# Patient Record
Sex: Male | Born: 1960
Health system: Southern US, Community
[De-identification: ages and names within clinical notes are randomized; demographics above are authoritative.]

## PROBLEM LIST (undated history)

## (undated) DIAGNOSIS — M549 Dorsalgia, unspecified: Secondary | ICD-10-CM

## (undated) DIAGNOSIS — G4730 Sleep apnea, unspecified: Secondary | ICD-10-CM

## (undated) DIAGNOSIS — E785 Hyperlipidemia, unspecified: Secondary | ICD-10-CM

## (undated) DIAGNOSIS — I10 Essential (primary) hypertension: Secondary | ICD-10-CM

## (undated) DIAGNOSIS — G8929 Other chronic pain: Secondary | ICD-10-CM

## (undated) DIAGNOSIS — Z720 Tobacco use: Secondary | ICD-10-CM

## (undated) DIAGNOSIS — M542 Cervicalgia: Secondary | ICD-10-CM

## (undated) DIAGNOSIS — E669 Obesity, unspecified: Secondary | ICD-10-CM

## (undated) HISTORY — DX: Obesity, unspecified: E66.9

## (undated) HISTORY — PX: CERVICAL SPINE SURGERY: SHX589

## (undated) HISTORY — DX: Essential (primary) hypertension: I10

## (undated) HISTORY — DX: Hyperlipidemia, unspecified: E78.5

## (undated) HISTORY — DX: Cervicalgia: M54.2

## (undated) HISTORY — DX: Dorsalgia, unspecified: M54.9

## (undated) HISTORY — DX: Other chronic pain: G89.29

## (undated) HISTORY — DX: Tobacco use: Z72.0

---

## 2003-01-20 ENCOUNTER — Ambulatory Visit (HOSPITAL_COMMUNITY): Admission: RE | Admit: 2003-01-20 | Discharge: 2003-01-20 | Payer: Self-pay | Admitting: Family Medicine

## 2003-01-20 ENCOUNTER — Encounter: Payer: Self-pay | Admitting: Family Medicine

## 2003-03-16 ENCOUNTER — Encounter: Payer: Self-pay | Admitting: *Deleted

## 2003-03-16 ENCOUNTER — Inpatient Hospital Stay (HOSPITAL_COMMUNITY): Admission: EM | Admit: 2003-03-16 | Discharge: 2003-03-18 | Payer: Self-pay | Admitting: *Deleted

## 2003-08-12 ENCOUNTER — Ambulatory Visit (HOSPITAL_COMMUNITY): Admission: RE | Admit: 2003-08-12 | Discharge: 2003-08-12 | Payer: Self-pay | Admitting: Family Medicine

## 2003-09-01 ENCOUNTER — Encounter (HOSPITAL_COMMUNITY): Admission: RE | Admit: 2003-09-01 | Discharge: 2003-09-11 | Payer: Self-pay | Admitting: Neurosurgery

## 2003-09-15 ENCOUNTER — Encounter (HOSPITAL_COMMUNITY): Admission: RE | Admit: 2003-09-15 | Discharge: 2003-10-15 | Payer: Self-pay | Admitting: Neurosurgery

## 2005-09-22 ENCOUNTER — Ambulatory Visit: Payer: Self-pay | Admitting: Family Medicine

## 2005-10-20 ENCOUNTER — Ambulatory Visit: Payer: Self-pay | Admitting: Family Medicine

## 2005-10-23 ENCOUNTER — Ambulatory Visit (HOSPITAL_COMMUNITY): Admission: RE | Admit: 2005-10-23 | Discharge: 2005-10-23 | Payer: Self-pay | Admitting: Family Medicine

## 2005-10-23 ENCOUNTER — Ambulatory Visit: Payer: Self-pay | Admitting: *Deleted

## 2006-01-05 ENCOUNTER — Ambulatory Visit: Payer: Self-pay | Admitting: Family Medicine

## 2006-06-29 ENCOUNTER — Ambulatory Visit: Payer: Self-pay | Admitting: Family Medicine

## 2006-09-21 ENCOUNTER — Ambulatory Visit: Payer: Self-pay | Admitting: Family Medicine

## 2007-09-12 ENCOUNTER — Encounter: Payer: Self-pay | Admitting: Family Medicine

## 2007-09-18 ENCOUNTER — Emergency Department (HOSPITAL_COMMUNITY): Admission: EM | Admit: 2007-09-18 | Discharge: 2007-09-18 | Payer: Self-pay | Admitting: Emergency Medicine

## 2007-10-11 ENCOUNTER — Ambulatory Visit: Payer: Self-pay | Admitting: Family Medicine

## 2007-10-16 ENCOUNTER — Encounter: Payer: Self-pay | Admitting: Family Medicine

## 2007-10-16 LAB — CONVERTED CEMR LAB
ALT: 19 units/L (ref 0–53)
AST: 14 units/L (ref 0–37)
Albumin: 4.3 g/dL (ref 3.5–5.2)
Alkaline Phosphatase: 84 units/L (ref 39–117)
BUN: 13 mg/dL (ref 6–23)
Basophils Absolute: 0 10*3/uL (ref 0.0–0.1)
Basophils Relative: 0 % (ref 0–1)
Bilirubin, Direct: 0.1 mg/dL (ref 0.0–0.3)
CO2: 22 meq/L (ref 19–32)
Calcium: 9.5 mg/dL (ref 8.4–10.5)
Chloride: 104 meq/L (ref 96–112)
Cholesterol: 222 mg/dL — ABNORMAL HIGH (ref 0–200)
Creatinine, Ser: 0.89 mg/dL (ref 0.40–1.50)
Eosinophils Absolute: 0.2 10*3/uL (ref 0.0–0.7)
Eosinophils Relative: 2 % (ref 0–5)
Glucose, Bld: 145 mg/dL — ABNORMAL HIGH (ref 70–99)
HCT: 49 % (ref 39.0–52.0)
HDL: 42 mg/dL (ref >39)
Hemoglobin: 16.2 g/dL (ref 13.0–17.0)
Indirect Bilirubin: 0.7 mg/dL (ref 0.0–0.9)
LDL Cholesterol: 139 mg/dL — ABNORMAL HIGH (ref 0–99)
Lymphocytes Relative: 27 % (ref 12–46)
Lymphs Abs: 3.3 10*3/uL (ref 0.7–4.0)
MCHC: 33.1 g/dL (ref 30.0–36.0)
MCV: 86.9 fL (ref 78.0–100.0)
Monocytes Absolute: 0.7 10*3/uL (ref 0.1–1.0)
Monocytes Relative: 5 % (ref 3–12)
Neutro Abs: 8.3 10*3/uL — ABNORMAL HIGH (ref 1.7–7.7)
Neutrophils Relative %: 66 % (ref 43–77)
PSA: 0.95 ng/mL (ref 0.10–4.00)
Platelets: 285 10*3/uL (ref 150–400)
Potassium: 4.2 meq/L (ref 3.5–5.3)
RBC: 5.64 M/uL (ref 4.22–5.81)
RDW: 14 % (ref 11.5–15.5)
Sodium: 141 meq/L (ref 135–145)
Total Bilirubin: 0.8 mg/dL (ref 0.3–1.2)
Total CHOL/HDL Ratio: 5.3
Total Protein: 7.7 g/dL (ref 6.0–8.3)
Triglycerides: 207 mg/dL — ABNORMAL HIGH (ref <150)
VLDL: 41 mg/dL — ABNORMAL HIGH (ref 0–40)
WBC: 12.6 10*3/uL — ABNORMAL HIGH (ref 4.0–10.5)

## 2007-10-17 ENCOUNTER — Encounter: Payer: Self-pay | Admitting: Family Medicine

## 2007-10-17 LAB — CONVERTED CEMR LAB: Hgb A1c MFr Bld: 6.3 % — ABNORMAL HIGH (ref 4.6–6.1)

## 2007-11-06 ENCOUNTER — Ambulatory Visit: Payer: Self-pay | Admitting: Family Medicine

## 2008-01-07 ENCOUNTER — Encounter: Payer: Self-pay | Admitting: Family Medicine

## 2008-02-11 DIAGNOSIS — F17200 Nicotine dependence, unspecified, uncomplicated: Secondary | ICD-10-CM

## 2008-02-11 DIAGNOSIS — M542 Cervicalgia: Secondary | ICD-10-CM | POA: Insufficient documentation

## 2008-02-11 DIAGNOSIS — M549 Dorsalgia, unspecified: Secondary | ICD-10-CM | POA: Insufficient documentation

## 2008-02-11 DIAGNOSIS — E669 Obesity, unspecified: Secondary | ICD-10-CM | POA: Insufficient documentation

## 2008-02-11 DIAGNOSIS — E66811 Obesity, class 1: Secondary | ICD-10-CM | POA: Insufficient documentation

## 2008-02-11 DIAGNOSIS — E785 Hyperlipidemia, unspecified: Secondary | ICD-10-CM

## 2008-02-11 DIAGNOSIS — Z6841 Body Mass Index (BMI) 40.0 and over, adult: Secondary | ICD-10-CM

## 2008-02-11 DIAGNOSIS — I10 Essential (primary) hypertension: Secondary | ICD-10-CM | POA: Insufficient documentation

## 2008-04-07 ENCOUNTER — Encounter: Payer: Self-pay | Admitting: Family Medicine

## 2008-12-22 ENCOUNTER — Encounter: Payer: Self-pay | Admitting: Family Medicine

## 2009-01-15 ENCOUNTER — Encounter: Payer: Self-pay | Admitting: Family Medicine

## 2009-05-26 ENCOUNTER — Ambulatory Visit: Payer: Self-pay | Admitting: Family Medicine

## 2009-05-26 LAB — CONVERTED CEMR LAB: Hgb A1c MFr Bld: 6.5 %

## 2009-06-03 ENCOUNTER — Telehealth: Payer: Self-pay | Admitting: Family Medicine

## 2009-06-04 ENCOUNTER — Encounter: Payer: Self-pay | Admitting: Family Medicine

## 2009-06-14 ENCOUNTER — Encounter: Payer: Self-pay | Admitting: Family Medicine

## 2009-08-16 ENCOUNTER — Telehealth: Payer: Self-pay | Admitting: Family Medicine

## 2009-08-31 ENCOUNTER — Encounter: Payer: Self-pay | Admitting: Family Medicine

## 2009-09-27 ENCOUNTER — Encounter: Payer: Self-pay | Admitting: Family Medicine

## 2009-11-10 ENCOUNTER — Ambulatory Visit: Payer: Self-pay | Admitting: Family Medicine

## 2009-11-10 DIAGNOSIS — N521 Erectile dysfunction due to diseases classified elsewhere: Secondary | ICD-10-CM

## 2009-11-10 DIAGNOSIS — E1169 Type 2 diabetes mellitus with other specified complication: Secondary | ICD-10-CM | POA: Insufficient documentation

## 2009-11-10 DIAGNOSIS — E785 Hyperlipidemia, unspecified: Secondary | ICD-10-CM | POA: Insufficient documentation

## 2009-11-10 DIAGNOSIS — E1159 Type 2 diabetes mellitus with other circulatory complications: Secondary | ICD-10-CM | POA: Insufficient documentation

## 2009-11-17 ENCOUNTER — Encounter: Payer: Self-pay | Admitting: Family Medicine

## 2009-11-18 LAB — CONVERTED CEMR LAB
BUN: 11 mg/dL (ref 6–23)
CO2: 25 meq/L (ref 19–32)
Calcium: 9.4 mg/dL (ref 8.4–10.5)
Chloride: 101 meq/L (ref 96–112)
Cholesterol: 183 mg/dL (ref 0–200)
Creatinine, Ser: 0.8 mg/dL (ref 0.40–1.50)
Creatinine, Urine: 175.5 mg/dL
Glucose, Bld: 130 mg/dL — ABNORMAL HIGH (ref 70–99)
HDL: 40 mg/dL (ref >39)
Hgb A1c MFr Bld: 8.3 % — ABNORMAL HIGH (ref 4.6–6.1)
LDL Cholesterol: 110 mg/dL — ABNORMAL HIGH (ref 0–99)
Microalb Creat Ratio: 8.2 mg/g (ref 0.0–30.0)
Microalb, Ur: 1.44 mg/dL (ref 0.00–1.89)
PSA: 0.57 ng/mL (ref 0.10–4.00)
Potassium: 4.3 meq/L (ref 3.5–5.3)
Sodium: 140 meq/L (ref 135–145)
TSH: 2.674 microintl units/mL (ref 0.350–4.500)
Testosterone: 215.25 ng/dL — ABNORMAL LOW (ref 350–890)
Total CHOL/HDL Ratio: 4.6
Triglycerides: 164 mg/dL — ABNORMAL HIGH (ref <150)
VLDL: 33 mg/dL (ref 0–40)
Vit D, 25-Hydroxy: 18 ng/mL — ABNORMAL LOW (ref 30–89)

## 2009-11-30 ENCOUNTER — Telehealth: Payer: Self-pay | Admitting: Family Medicine

## 2010-01-05 ENCOUNTER — Encounter: Admission: RE | Admit: 2010-01-05 | Discharge: 2010-01-05 | Payer: Self-pay | Admitting: Orthopedic Surgery

## 2010-02-04 ENCOUNTER — Encounter: Admission: RE | Admit: 2010-02-04 | Discharge: 2010-02-04 | Payer: Self-pay | Admitting: *Deleted

## 2010-02-08 ENCOUNTER — Encounter: Payer: Self-pay | Admitting: Family Medicine

## 2010-02-18 ENCOUNTER — Ambulatory Visit: Payer: Self-pay | Admitting: Family Medicine

## 2010-02-18 LAB — HM DIABETES FOOT EXAM

## 2010-02-25 ENCOUNTER — Telehealth: Payer: Self-pay | Admitting: Family Medicine

## 2010-03-22 ENCOUNTER — Encounter: Payer: Self-pay | Admitting: Family Medicine

## 2010-08-08 ENCOUNTER — Telehealth: Payer: Self-pay | Admitting: Family Medicine

## 2010-08-16 ENCOUNTER — Emergency Department (HOSPITAL_COMMUNITY)
Admission: EM | Admit: 2010-08-16 | Discharge: 2010-08-16 | Payer: Self-pay | Source: Home / Self Care | Admitting: Emergency Medicine

## 2010-08-18 ENCOUNTER — Ambulatory Visit: Payer: Self-pay | Admitting: Family Medicine

## 2010-10-11 NOTE — Letter (Signed)
Summary: Letter  Letter   Imported By: Lind Guest 03/22/2010 15:24:14  _____________________________________________________________________  External Attachment:    Type:   Image     Comment:   External Document

## 2010-10-11 NOTE — Letter (Signed)
Summary: PHONE NOTES  PHONE NOTES   Imported By: Lind Guest 03/01/2010 14:34:30  _____________________________________________________________________  External Attachment:    Type:   Image     Comment:   External Document

## 2010-10-11 NOTE — Letter (Signed)
Summary: DEMO  DEMO   Imported By: Lind Guest 03/01/2010 14:44:03  _____________________________________________________________________  External Attachment:    Type:   Image     Comment:   External Document

## 2010-10-11 NOTE — Progress Notes (Signed)
Summary: ORTHOPAEDIC  ORTHOPAEDIC   Imported By: Lind Guest 02/16/2010 13:43:37  _____________________________________________________________________  External Attachment:    Type:   Image     Comment:   External Document

## 2010-10-11 NOTE — Letter (Signed)
Summary: OFFICE NOTES  OFFICE NOTES   Imported By: Lind Guest 03/01/2010 14:42:28  _____________________________________________________________________  External Attachment:    Type:   Image     Comment:   External Document

## 2010-10-11 NOTE — Letter (Signed)
Summary: X RAYS  X RAYS   Imported By: Lind Guest 03/01/2010 14:43:18  _____________________________________________________________________  External Attachment:    Type:   Image     Comment:   External Document

## 2010-10-11 NOTE — Assessment & Plan Note (Signed)
Summary: HTN Room-1   Vital Signs:  Patient profile:   50 year old male Height:      72 inches Weight:      321.25 pounds BMI:     43.73 O2 Sat:      96 % Pulse rate:   112 / minute Resp:     16 per minute BP sitting:   150 / 102 Cuff size:   xl  Vitals Entered By: Everitt Amber LPN (November 10, 1608 10:51 AM)  Nutrition Counseling: Patient's BMI is greater than 25 and therefore counseled on weight management options. CC: Follow up visit, high BP. Hasn't taken his BP pills in awhile due to side effects Comments TIME FOR HBA1C CHECK   CC:  Follow up visit and high BP. Hasn't taken his BP pills in awhile due to side effects.  History of Present Illness: Pt was last seen Sept 2010.  He is here to follow up on his BP.  States he stopped taking his medications months ago because of side effects.  His BP meds were changed in Sept 2010 because he complained of side effects from those.  Pt reports that he is having difficulty with erections.  He is associating this to his medications, but when I asked if the problem gets better off the meds he says no.  Pt states he thinks the Lotrel was better than the last BP med he was on.   Pt states the he sometimes checks his blood sugar at home.  He is unable to recall what the readings are, & only states "they're not too bad."  He has started walking for exercise is the last 3-4 weeks.  He denies chest pain, or difficulty breathing.   Current Medications (verified): 1)  Maxzide 75-50 Mg Tabs (Triamterene-Hctz) .... Take 1 Tablet By Mouth Once A Day 2)  Cyclobenzaprine Hcl 10 Mg Tabs (Cyclobenzaprine Hcl) .... Take 1 Tab By Mouth At Bedtime 3)  Meloxicam 15 Mg Tabs (Meloxicam) .... Take 1 Tablet By Mouth Once A Day 4)  Metformin Hcl 500 Mg Tabs (Metformin Hcl) .... Take 1 Tablet By Mouth Two Times A Day  Allergies (verified): No Known Drug Allergies  Past History:  Past medical, surgical, family and social histories (including risk factors)  reviewed for relevance to current acute and chronic problems.  Past Medical History: Reviewed history from 02/11/2008 and no changes required.   TOBACCO ABUSE (ICD-305.1) NECK PAIN, CHRONIC (ICD-723.1) BACK PAIN, CHRONIC (ICD-724.5) HYPERLIPIDEMIA (ICD-272.4) OBESITY (ICD-278.00) HYPERTENSION (ICD-401.9)  Past Surgical History: Reviewed history from 02/11/2008 and no changes required. Denies surgical history  Family History: Reviewed history from 02/11/2008 and no changes required. Father: cirrhosis Mother: HTN, MI, DEPRESSION Siblings: six all healthy  Social History: Reviewed history from 02/11/2008 and no changes required. Marital Status: Married Children: four Occupation: Runner, broadcasting/film/video Current Smoker Alcohol use-no Drug use-no  Review of Systems General:  Denies chills and fever. Eyes:  Denies blurring and double vision. ENT:  Denies earache, nasal congestion, and sinus pressure. CV:  Denies chest pain or discomfort, palpitations, and swelling of feet. Resp:  Denies cough and shortness of breath. GU:  Complains of erectile dysfunction. Heme:  Denies enlarge lymph nodes and fevers.  Physical Exam  General:  alert, appropriate dress, cooperative to examination, good hygiene, and overweight-appearing.  Wt increased from prev visit. Head:  Normocephalic and atraumatic without obvious abnormalities. No apparent alopecia or balding. Eyes:  No corneal or conjunctival inflammation noted. EOMI. Perrla. Funduscopic exam benign,  without hemorrhages, exudates or papilledema. Vision grossly normal. Ears:  External ear exam shows no significant lesions or deformities.  Otoscopic examination reveals clear canals, tympanic membranes are intact bilaterally without bulging, retraction, inflammation or discharge. Hearing is grossly normal bilaterally. Nose:  External nasal examination shows no deformity or inflammation. Nasal mucosa are pink and moist without lesions or exudates. Mouth:   Oral mucosa and oropharynx without lesions or exudates.  Teeth in good repair. Neck:  No deformities, masses, or tenderness noted. Lungs:  Normal respiratory effort, chest expands symmetrically. Lungs are clear to auscultation, no crackles or wheezes. Heart:  Normal rate and regular rhythm. S1 and S2 normal without gallop, murmur, click, rub or other extra sounds. Pulses:  R posterior tibial normal, R dorsalis pedis normal, L posterior tibial normal, and L dorsalis pedis normal.   Extremities:  No clubbing, cyanosis, edema, or deformity noted with normal full range of motion of all joints.   Neurologic:  alert & oriented X3, sensation intact to light touch, and gait normal.   Cervical Nodes:  No lymphadenopathy noted Psych:  Cognition and judgment appear intact. Alert and cooperative with normal attention span and concentration. No apparent delusions, illusions, hallucinations  Diabetes Management Exam:    Foot Exam (with socks and/or shoes not present):       Sensory-Monofilament:          Left foot: normal          Right foot: normal       Inspection:          Left foot: abnormal             Comments: onycomycosis, callouses, dry peeling plantar aspect          Right foot: abnormal             Comments: onycomycosis, callouses, dry peeling plantar aspect       Nails:          Left foot: fungal infection          Right foot: fungal infection   Impression & Recommendations:  Problem # 1:  HYPERTENSION (ICD-401.9) Assessment Deteriorated Noncompliant with medications.  Will restart Lotrel 10/40 1 daily.   The following medications were removed from the medication list:    Maxzide 75-50 Mg Tabs (Triamterene-hctz) .Marland Kitchen... Take 1 tablet by mouth once a day His updated medication list for this problem includes:    Lotrel 10-40 Mg Caps (Amlodipine besy-benazepril hcl) .Marland Kitchen... 1 daily for high blood pressure  Orders: T-Basic Metabolic Panel [id number]) T-Vitamin D (25-Hydroxy)  [phone])  Problem # 2:  DM (ICD-250.00) Noncompliant with medications.  His updated medication list for this problem includes:    Metformin Hcl 500 Mg Tabs (Metformin hcl) .Marland Kitchen... Take 1 tablet by mouth two times a day    Lotrel 10-40 Mg Caps (Amlodipine besy-benazepril hcl) .Marland Kitchen... 1 daily for high blood pressure  Orders: T-Basic Metabolic Panel [phone]) T-Testosterone; Total [id number]) T- Hemoglobin A1C ([id number]) TLB-Microalbumin/Creat Ratio, Urine ([id number]) T-Vitamin D (25-Hydroxy) ([id number])  Problem # 3:  ERECTILE DYSFUNCTION, ORGANIC (IHK-742.59) Assessment: New Discussed with pt that ED can be caused by some BP meds, but that his problem is ongoing, so not just because of his meds.  Discussed with him that having uncontrolled DM & HTN can cause ED.  That we can discuss treatment options for ED but need his BP controlled first.  Problem # 4:  OBESITY (ICD-278.00) Assessment: Deteriorated  Orders: T-TSH ([id number])  Ht: 72 (11/10/2009)  Wt: 321.25 (11/10/2009)   BMI: 43.73 (11/10/2009)  Complete Medication List: 1)  Cyclobenzaprine Hcl 10 Mg Tabs (Cyclobenzaprine hcl) .... Take 1 tab by mouth at bedtime 2)  Meloxicam 15 Mg Tabs (Meloxicam) .... Take 1 tablet by mouth once a day 3)  Metformin Hcl 500 Mg Tabs (Metformin hcl) .... Take 1 tablet by mouth two times a day 4)  Lotrel 10-40 Mg Caps (Amlodipine besy-benazepril hcl) .Marland Kitchen.. 1 daily for high blood pressure  Other Orders: T-Lipid Profile ([id number]) T-PSA ([id number])  Patient Instructions: 1)  Please schedule a follow-up appointment in 2 weeks with Dr. Lodema Hong. 2)  Have your blood work drawn fasting. 3)  Restart your blood pressure medication & glucophage. 4)  See your eye doctor yearly to check for diabetic eye damage. 5)  It is very important that you take your medications as prescribed, and get your blood pressure & diabetes under control.  If you do not then you are at risk of having a  heart attack, stroke, kidney failure, blindness or amputations. 6)  It is important that you exercise regularly at least 20 minutes 5 times a week. If you develop chest pain, have severe difficulty breathing, or feel very tired , stop exercising immediately and seek medical attention. 7)  You need to lose weight. Consider a lower calorie diet and regular exercise.  Prescriptions: METFORMIN HCL 500 MG TABS (METFORMIN HCL) Take 1 tablet by mouth two times a day  #60 x 2   Entered and Authorized by:   Esperanza Sheets PA   Signed by:   Esperanza Sheets PA on 11/10/2009   Method used:   Electronically to        [facility]  [address] (retail)       [address]       [place], [place]  [address]       Ph: [phone]       Fax: [phone]   RxID:   [id number] LOTREL 10-40 MG CAPS (AMLODIPINE BESY-BENAZEPRIL HCL) 1 daily for high blood pressure  #30 x 2   Entered and Authorized by:   Esperanza Sheets PA   Signed by:   Esperanza Sheets PA on 11/10/2009   Method used:   Electronically to        [facility]  [address] (retail)       [address]       [place], [place]  [address]       Ph: [phone]       Fax: [phone]   RxID:   [id number]

## 2010-10-11 NOTE — Letter (Signed)
Summary: MISC  MISC   Imported By: Lind Guest 03/01/2010 14:33:53  _____________________________________________________________________  External Attachment:    Type:   Image     Comment:   External Document

## 2010-10-11 NOTE — Letter (Signed)
Summary: LABS  LABS   Imported By: Lind Guest 03/01/2010 14:41:03  _____________________________________________________________________  External Attachment:    Type:   Image     Comment:   External Document

## 2010-10-11 NOTE — Letter (Signed)
Summary: Letter  Letter   Imported By: Rudene Anda 09/27/2009 09:15:56  _____________________________________________________________________  External Attachment:    Type:   Image     Comment:   External Document

## 2010-10-11 NOTE — Progress Notes (Signed)
Summary: rx for cream  Phone Note Call from Patient   Summary of Call: pt needs a rx for cream for icthing on stomach [phone]  Follow-up for Phone Call        med sent in , let hiom know Follow-up by: Syliva Overman MD,  February 25, 2010 12:32 PM  Additional Follow-up for Phone Call Additional follow up Details #1::        Phone Call Completed Additional Follow-up by: Adella Hare LPN,  February 25, 2010 3:23 PM    New/Updated Medications: BETAMETHASONE VALERATE 0.1 % CREA (BETAMETHASONE VALERATE) apply to affected area twice daily as needed Prescriptions: BETAMETHASONE VALERATE 0.1 % CREA (BETAMETHASONE VALERATE) apply to affected area twice daily as needed  #45 gm x 0   Entered and Authorized by:   Syliva Overman MD   Signed by:   Syliva Overman MD on 02/25/2010   Method used:   Electronically to        [facility]  [address] [address] (retail)       [address]       [place], [place]  [address]       Ph: [phone]       Fax: [phone]   RxID:   [id number]

## 2010-10-11 NOTE — Assessment & Plan Note (Signed)
Summary: CLEARANCE FOR SURGERY   Vital Signs:  Patient profile:   50 year old male Height:      72 inches Weight:      319.50 pounds BMI:     43.49 O2 Sat:      96 % Pulse rate:   101 / minute Pulse rhythm:   regular Resp:     16 per minute BP sitting:   144 / 100  (left arm)  Vitals Entered By: Everitt Amber LPN (February 18, 2010 10:29 AM)  Nutrition Counseling: Patient's BMI is greater than 25 and therefore counseled on weight management options. CC: Surgical clearance   CC:  Surgical clearance.  History of Present Illness: Pt recently evaluated by neurosurgery , and has neck surgery planned for next week, he is here for surgical clearance. He denies chest pain or palpitations, pND, orthopnea or leg swelling. There is no significant family h/o cAD. he has not been testing his blood sugars and denies polyuria , POLYDYPSIOA OR BLURRED VISION. hE REPORTS  non compliance with his diabvetic meds. He denies any recent fever or chills, head or chest congestion.  Preventive Screening-Counseling & Management  Alcohol-Tobacco     Smoking Cessation Counseling: yes  Current Medications (verified): 1)  Metformin Hcl 500 Mg Tabs (Metformin Hcl) .... Take 1 Tablet By Mouth Two Times A Day 2)  Lotrel 10-40 Mg Caps (Amlodipine Besy-Benazepril Hcl) .Marland Kitchen.. 1 Daily For High Blood Pressure  Allergies (verified): No Known Drug Allergies  Review of Systems      See HPI Eyes:  Denies blurring and discharge. ENT:  Denies hoarseness, nasal congestion, sinus pressure, and sore throat. CV:  Denies chest pain or discomfort, difficulty breathing while lying down, palpitations, shortness of breath with exertion, and swelling of feet. Resp:  Complains of cough; denies sputum productive; smoker's cough, still smokes, accepts the need to quit esp in light of upcoming surgery. GI:  Denies abdominal pain, constipation, diarrhea, nausea, and vomiting. GU:  Complains of erectile dysfunction; denies dysuria,  urinary frequency, and urinary hesitancy; difficulty attaining and maintaining erections. MS:  Complains of joint pain, muscle weakness, and stiffness. Derm:  Denies itching and rash. Endo:  Denies cold intolerance, excessive hunger, excessive thirst, excessive urination, heat intolerance, polyuria, and weight change; uncontrolled dm based on most recent HBA1. Heme:  Denies abnormal bruising and bleeding. Allergy:  Denies hives or rash and itching eyes.  Physical Exam  General:  Well-developed,OBESE,in no acute distress; alert,appropriate and cooperative throughout examination HEENT: No facial asymmetry,  EOMI, No sinus tenderness, TM's Clear, oropharynx  pink and moist. decresed ROM cervical spine  Chest: Clear to auscultation bilaterally.  CVS: S1, S2, No murmurs, No S3.   Abd: Soft, Nontender.  MS: Adequate ROM spine, hips, shoulders and knees.  Ext: No edema.   CNS: CN 2-12 intact, power tone and sensation normal throughout.   Skin: Intact, no visible lesions or rashes.  Psych: Good eye contact, normal affect.  Memory intact, not anxious or depressed appearing.   Diabetes Management Exam:    Foot Exam (with socks and/or shoes not present):       Sensory-Monofilament:          Left foot: diminished       Inspection:          Left foot: normal          Right foot: normal   Impression & Recommendations:  Problem # 1:  ERECTILE DYSFUNCTION, ORGANIC (ICD-607.84) Assessment Deteriorated  His  updated medication list for this problem includes:    Cialis 20 Mg Tabs (Tadalafil) .Marland Kitchen... Take 1 tablet by mouth 30 mionutes before intercourse as needed  Problem # 2:  DM (ICD-250.00) Assessment: Deteriorated  The following medications were removed from the medication list:    Metformin Hcl 500 Mg Tabs (Metformin hcl) .Marland Kitchen... Take 1 tablet by mouth two times a day His updated medication list for this problem includes:    Lotrel 10-40 Mg Caps (Amlodipine besy-benazepril hcl) .Marland Kitchen... 1 daily  for high blood pressure    Metformin Hcl 1000 Mg Tabs (Metformin hcl) .Marland Kitchen... Take 1 tablet by mouth two times a day  Orders: T- Hemoglobin A1C ([id number]) EKG w/ Interpretation ([address])  Labs Reviewed: Creat: 0.80 (11/17/2009)    Reviewed HgBA1c results: 8.3 (11/17/2009)  6.5 (05/26/2009)  Problem # 3:  HYPERTENSION (ICD-401.9) Assessment: Unchanged  His updated medication list for this problem includes:    Lotrel 10-40 Mg Caps (Amlodipine besy-benazepril hcl) .Marland Kitchen... 1 daily for high blood pressure    Clonidine Hcl 0.2 Mg Tabs (Clonidine hcl) .Marland Kitchen... Take 1 tab by mouth at bedtime  BP today: 144/100 Prior BP: 150/102 (11/10/2009)  Labs Reviewed: K+: 4.3 (11/17/2009) Creat: : 0.80 (11/17/2009)   Chol: 183 (11/17/2009)   HDL: 40 (11/17/2009)   LDL: 110 (11/17/2009)   TG: 164 (11/17/2009)  Orders: EKG w/ Interpretation ([address]), nsr, no evidence of ischemia  Problem # 4:  HYPERLIPIDEMIA (ICD-272.4) Assessment: Comment Only  His updated medication list for this problem includes:    Lovastatin 40 Mg Tabs (Lovastatin) .Marland Kitchen... Take 1 tab by mouth at bedtime  Orders: T-Hepatic Function ([id number])  Labs Reviewed: SGOT: 14 (10/16/2007)   SGPT: 19 (10/16/2007)   HDL:40 (11/17/2009), 42 (10/16/2007)  LDL:110 (11/17/2009), 139 (10/16/2007)  Chol:183 (11/17/2009), 222 (10/16/2007)  Trig:164 (11/17/2009), 207 (10/16/2007)  Complete Medication List: 1)  Lotrel 10-40 Mg Caps (Amlodipine besy-benazepril hcl) .Marland Kitchen.. 1 daily for high blood pressure 2)  Clonidine Hcl 0.2 Mg Tabs (Clonidine hcl) .... Take 1 tab by mouth at bedtime 3)  Lovastatin 40 Mg Tabs (Lovastatin) .... Take 1 tab by mouth at bedtime 4)  Metformin Hcl 1000 Mg Tabs (Metformin hcl) .... Take 1 tablet by mouth two times a day 5)  Chantix Starting Month Pak 0.5 Mg X 11 & 1 Mg X 42 Tabs (Varenicline tartrate) .... Use as directed 6)  Cialis 20 Mg Tabs (Tadalafil) .... Take 1 tablet by mouth 30 mionutes before intercourse as  needed  Patient Instructions: 1)  F/U in 6 weeks. 2)  It is important that you exercise regularly at least 20 minutes 5 times a week. If you develop chest pain, have severe difficulty breathing, or feel very tired , stop exercising immediately and seek medical attention. 3)  You need to lose weight. Consider a lower calorie diet and regular exercise. 4)  Dose increase of the metformin , take TWO 500mg  twice daily, until finished, then the new med is 1000mg  twice daily  5)  Tobacco is very bad for your health and your loved ones! You Should stop smoking!. 6)  Stop Smoking Tips: Choose a Quit date. Cut down before the Quit date. decide what you will do as a substitute when you feel the urge to smoke(gum,toothpick,exercise).Med sent in for smoking cessation Prescriptions: CIALIS 20 MG TABS (TADALAFIL) Take 1 tablet by mouth 30 mionutes before intercourse as needed  #3 x 3   Entered and Authorized by:   Syliva Overman MD   Signed by:  Syliva Overman MD on 02/18/2010   Method used:   Electronically to        [facility] [address] [facility] (retail)       [address]       [place], [place]  [address]       Ph: [phone]       Fax: [phone]   RxID:   [id number] CHANTIX STARTING MONTH PAK 0.5 MG X 11 & 1 MG X 42 TABS (VARENICLINE TARTRATE) Use as directed  #1 x 0   Entered and Authorized by:   Syliva Overman MD   Signed by:   Syliva Overman MD on 02/18/2010   Method used:   Printed then faxed to ...       [facility]  [place] [address] (retail)       [address]       [place], [place]  [address]       Ph: [phone]       Fax: [phone]   RxID:   [id number] METFORMIN HCL 1000 MG TABS (METFORMIN HCL) Take 1 tablet by mouth two times a day  #60 x 3   Entered and Authorized by:   Syliva Overman MD   Signed by:   Syliva Overman MD on 02/18/2010   Method used:   Printed then faxed to ...       [facility]  [address] [address] (retail)       [address]        [place], [place]  [address]       Ph: [phone]       Fax: [phone]   RxID:   [id number] LOVASTATIN 40 MG TABS (LOVASTATIN) Take 1 tab by mouth at bedtime  #30 x 3   Entered and Authorized by:   Syliva Overman MD   Signed by:   Syliva Overman MD on 02/18/2010   Method used:   Electronically to        [facility]  [place] [address] (retail)       [address]       [place], [place]  [address]       Ph: [phone]       Fax: [phone]   RxID:   [id number] CLONIDINE HCL 0.2 MG TABS (CLONIDINE HCL) Take 1 tab by mouth at bedtime  #30 x 2   Entered and Authorized by:   Syliva Overman MD   Signed by:   Syliva Overman MD on 02/18/2010   Method used:   Electronically to        [facility]  [place] [address] (retail)       [address]       [place], [place]  [address]       Ph: [phone]       Fax: [phone]   RxID:   [id number] METFORMIN HCL 500 MG TABS (METFORMIN HCL) Take 1 tablet by mouth two times a day  #60 x 5   Entered by:   Everitt Amber LPN   Authorized by:   Syliva Overman MD   Signed by:   Everitt Amber LPN on 19/50/9326   Method used:   Electronically to        [facility] [address] (retail)  [address]       [place], [place]  [address]       Ph: [phone]       Fax: [phone]   RxID:   [id number]

## 2010-10-11 NOTE — Progress Notes (Signed)
Summary: LAB RESULTS  Phone Note Call from Patient   Summary of Call: WANTS TO KNOW ABOUT HIS LAB RESULTS  CALL BACK AT [phone] Initial call taken by: Lind Guest,  November 30, 2009 9:05 AM  Follow-up for Phone Call        patient aware Follow-up by: Adella Hare LPN,  November 30, 2009 11:53 AM

## 2010-10-11 NOTE — Letter (Signed)
Summary: CONSULTS  CONSULTS   Imported By: Lind Guest 03/01/2010 14:40:13  _____________________________________________________________________  External Attachment:    Type:   Image     Comment:   External Document

## 2010-10-11 NOTE — Progress Notes (Signed)
Summary: medication  Phone Note Call from Patient   Summary of Call: patient has an appt. on Dec. 8 at 8:45 he requested that you please call in his medication that was denied until he comes in. please advise patient. Initial call taken by: Curtis Sites,  August 08, 2010 11:07 AM  Follow-up for Phone Call        patient aware Follow-up by: Adella Hare LPN,  August 08, 2010 4:35 PM    Prescriptions: METFORMIN HCL 1000 MG TABS (METFORMIN HCL) Take 1 tablet by mouth two times a day  #60 x 0   Entered by:   Adella Hare LPN   Authorized by:   Syliva Overman MD   Signed by:   Adella Hare LPN on 65/78/4696   Method used:   Electronically to        [facility]  [address] (retail)       [address]       [place], [place]  [address]       Ph: [phone]       Fax: [phone]   RxID:   [id number] LOVASTATIN 40 MG TABS (LOVASTATIN) Take 1 tab by mouth at bedtime  #30 x 0   Entered by:   Adella Hare LPN   Authorized by:   Syliva Overman MD   Signed by:   Adella Hare LPN on 29/51/8841   Method used:   Electronically to        [facility]  [address] (retail)       [address]       [place], [place]  [address]       Ph: [phone]       Fax: [phone]   RxID:   [id number] CLONIDINE HCL 0.2 MG TABS (CLONIDINE HCL) Take 1 tab by mouth at bedtime  #30 Each x 0   Entered by:   Adella Hare LPN   Authorized by:   Syliva Overman MD   Signed by:   Adella Hare LPN on 16/03/3709   Method used:   Electronically to        [facility]  [address] (retail)       [address]       [place], [place]  [address]       Ph: [phone]       Fax: [phone]   RxID:   [id number] LOTREL 10-40 MG CAPS (AMLODIPINE BESY-BENAZEPRIL HCL) 1 daily for high blood pressure  #30 Each x 0   Entered by:   Adella Hare LPN   Authorized by:   Syliva Overman MD   Signed by:   Adella Hare LPN on 02/58/5277   Method used:    Electronically to        [facility]  [address] (retail)       [address]       [place], [place]  [address]       Ph: [phone]       Fax: [phone]   RxID:   [id number]

## 2010-10-14 NOTE — Letter (Signed)
Summary: HISTORY AND PHYSICAL  HISTORY AND PHYSICAL   Imported By: Lind Guest 03/01/2010 14:42:02  _____________________________________________________________________  External Attachment:    Type:   Image     Comment:   External Document

## 2010-11-18 ENCOUNTER — Emergency Department (HOSPITAL_COMMUNITY)
Admission: EM | Admit: 2010-11-18 | Discharge: 2010-11-18 | Disposition: A | Discharge status: Home / Self Care | Payer: PRIVATE HEALTH INSURANCE | Attending: Emergency Medicine | Admitting: Emergency Medicine

## 2010-11-18 DIAGNOSIS — R42 Dizziness and giddiness: Secondary | ICD-10-CM | POA: Insufficient documentation

## 2010-11-18 DIAGNOSIS — I10 Essential (primary) hypertension: Secondary | ICD-10-CM | POA: Insufficient documentation

## 2010-11-18 DIAGNOSIS — Z79899 Other long term (current) drug therapy: Secondary | ICD-10-CM | POA: Insufficient documentation

## 2010-11-18 DIAGNOSIS — E119 Type 2 diabetes mellitus without complications: Secondary | ICD-10-CM | POA: Insufficient documentation

## 2010-11-28 ENCOUNTER — Ambulatory Visit (INDEPENDENT_AMBULATORY_CARE_PROVIDER_SITE_OTHER): Payer: PRIVATE HEALTH INSURANCE | Admitting: Family Medicine

## 2010-11-28 ENCOUNTER — Encounter: Payer: Self-pay | Admitting: Family Medicine

## 2010-11-28 DIAGNOSIS — I10 Essential (primary) hypertension: Secondary | ICD-10-CM

## 2010-11-28 DIAGNOSIS — E119 Type 2 diabetes mellitus without complications: Secondary | ICD-10-CM

## 2010-11-28 DIAGNOSIS — E785 Hyperlipidemia, unspecified: Secondary | ICD-10-CM

## 2010-11-28 DIAGNOSIS — E669 Obesity, unspecified: Secondary | ICD-10-CM

## 2010-11-29 ENCOUNTER — Encounter: Payer: Self-pay | Admitting: Family Medicine

## 2010-11-29 ENCOUNTER — Telehealth: Payer: Self-pay | Admitting: Family Medicine

## 2010-11-29 LAB — CONVERTED CEMR LAB
ALT: 12 units/L (ref 0–53)
AST: 13 units/L (ref 0–37)
Albumin: 4.2 g/dL (ref 3.5–5.2)
Alkaline Phosphatase: 75 units/L (ref 39–117)
BUN: 10 mg/dL (ref 6–23)
Basophils Absolute: 0 10*3/uL (ref 0.0–0.1)
Basophils Relative: 0 % (ref 0–1)
Bilirubin, Direct: 0.2 mg/dL (ref 0.0–0.3)
CO2: 25 meq/L (ref 19–32)
Calcium: 9.4 mg/dL (ref 8.4–10.5)
Chloride: 103 meq/L (ref 96–112)
Cholesterol: 224 mg/dL — ABNORMAL HIGH (ref 0–200)
Creatinine, Ser: 0.77 mg/dL (ref 0.40–1.50)
Creatinine, Urine: 144 mg/dL
Eosinophils Absolute: 0.2 10*3/uL (ref 0.0–0.7)
Eosinophils Relative: 2 % (ref 0–5)
Glucose, Bld: 118 mg/dL — ABNORMAL HIGH (ref 70–99)
HCT: 46.2 % (ref 39.0–52.0)
HDL: 39 mg/dL — ABNORMAL LOW (ref >39)
Hemoglobin: 15.1 g/dL (ref 13.0–17.0)
Hgb A1c MFr Bld: 7.1 % — ABNORMAL HIGH (ref <5.7)
Indirect Bilirubin: 0.9 mg/dL (ref 0.0–0.9)
LDL Cholesterol: 144 mg/dL — ABNORMAL HIGH (ref 0–99)
Lymphocytes Relative: 32 % (ref 12–46)
Lymphs Abs: 3.5 10*3/uL (ref 0.7–4.0)
MCHC: 32.7 g/dL (ref 30.0–36.0)
MCV: 86.7 fL (ref 78.0–100.0)
Microalb Creat Ratio: 12.1 mg/g (ref 0.0–30.0)
Microalb, Ur: 1.74 mg/dL (ref 0.00–1.89)
Monocytes Absolute: 0.5 10*3/uL (ref 0.1–1.0)
Monocytes Relative: 5 % (ref 3–12)
Neutro Abs: 6.9 10*3/uL (ref 1.7–7.7)
Neutrophils Relative %: 62 % (ref 43–77)
PSA: 0.93 ng/mL (ref <=4.00)
Platelets: 300 10*3/uL (ref 150–400)
Potassium: 4.2 meq/L (ref 3.5–5.3)
RBC: 5.33 M/uL (ref 4.22–5.81)
RDW: 14.9 % (ref 11.5–15.5)
Sodium: 140 meq/L (ref 135–145)
TSH: 2.928 microintl units/mL (ref 0.350–4.500)
Total Bilirubin: 1.1 mg/dL (ref 0.3–1.2)
Total CHOL/HDL Ratio: 5.7
Total Protein: 7.4 g/dL (ref 6.0–8.3)
Triglycerides: 203 mg/dL — ABNORMAL HIGH (ref <150)
VLDL: 41 mg/dL — ABNORMAL HIGH (ref 0–40)
Vit D, 25-Hydroxy: 20 ng/mL — ABNORMAL LOW (ref 30–89)
WBC: 11.1 10*3/uL — ABNORMAL HIGH (ref 4.0–10.5)

## 2010-12-01 ENCOUNTER — Telehealth: Payer: Self-pay | Admitting: Family Medicine

## 2010-12-01 ENCOUNTER — Other Ambulatory Visit: Payer: Self-pay

## 2010-12-01 DIAGNOSIS — I10 Essential (primary) hypertension: Secondary | ICD-10-CM

## 2010-12-01 MED ORDER — AMLODIPINE BESY-BENAZEPRIL HCL 10-40 MG PO CAPS: 1.0000 | ORAL_CAPSULE | Freq: Every day | ORAL | Status: DC

## 2010-12-01 NOTE — Telephone Encounter (Signed)
Patient aware.

## 2010-12-01 NOTE — Telephone Encounter (Signed)
Med sent.

## 2010-12-07 ENCOUNTER — Encounter: Payer: Self-pay | Admitting: Family Medicine

## 2010-12-08 NOTE — Assessment & Plan Note (Signed)
Summary: office visit   Vital Signs:  Patient profile:   50 year old male Height:      72 inches Weight:      313.25 pounds BMI:     42.64 O2 Sat:      98 % Pulse rate:   98 / minute Pulse rhythm:   regular Resp:     16 per minute BP sitting:   158 / 110  (left arm) Cuff size:   xl  Vitals Entered By: Everitt Amber LPN (November 28, 2010 8:30 AM)  Nutrition Counseling: Patient's BMI is greater than 25 and therefore counseled on weight management options. CC: had stopped all of his meds but started feeling dizzy the other week so he went to the ER and his pressure was elevated so he started back on his BP med   CC:  had stopped all of his meds but started feeling dizzy the other week so he went to the ER and his pressure was elevated so he started back on his BP med.  History of Present Illness: Pt reports he had an episode of  feeling as though he was drunk 2 fridays ago, went to the ed, his bP was reportedly 210, he had stopped his meds, buit has now resumed one BP med.He denies dry mouth, excessive thirst , or blurred vision, he has laso stopped his diabetic meds. Vascular disease is in hisst degree relatives  Preventive Screening-Counseling & Management  Alcohol-Tobacco     Smoking Cessation Counseling: yes  Current Medications (verified): 1)  Lotrel 10-40 Mg Caps (Amlodipine Besy-Benazepril Hcl) .Marland Kitchen.. 1 Daily For High Blood Pressure  Allergies (verified): No Known Drug Allergies  Review of Systems      See HPI General:  Complains of fatigue. Eyes:  Complains of blurring. ENT:  Denies earache, hoarseness, nasal congestion, and sinus pressure. CV:  Denies chest pain or discomfort, palpitations, and swelling of feet. Resp:  Denies cough and sputum productive. GI:  Denies abdominal pain, constipation, diarrhea, nausea, and vomiting. GU:  Complains of erectile dysfunction. MS:  Denies joint pain and stiffness. Psych:  Denies anxiety and depression. Endo:  Complains of  excessive thirst and excessive urination. Heme:  Denies abnormal bruising and bleeding. Allergy:  Denies hives or rash and itching eyes.  Physical Exam  General:  Well-developed,obese,in no acute distress; alert,appropriate and cooperative throughout examination HEENT: No facial asymmetry, reduced ROM neck EOMI, No sinus tenderness, TM's Clear, oropharynx  pink and moist.   Chest: Clear to auscultation bilaterally.  CVS: S1, S2, No murmurs, No S3.   Abd: Soft, Nontender.  MS: Adequate ROM spine, hips, shoulders and knees.  Ext: No edema.   CNS: CN 2-12 intact, power tone and sensation normal throughout.   Skin: Intact, no visible lesions or rashes.  Psych: Good eye contact, normal affect.  Memory intact, not anxious or depressed appearing.    Impression & Recommendations:  Problem # 1:  DM (ICD-250.00) Assessment Comment Only  The following medications were removed from the medication list:    Metformin Hcl 1000 Mg Tabs (Metformin hcl) .Marland Kitchen... Take 1 tablet by mouth two times a day His updated medication list for this problem includes:    Lotrel 10-40 Mg Caps (Amlodipine besy-benazepril hcl) .Marland Kitchen... 1 daily for high blood pressure  Orders: T- Hemoglobin A1C ([address]-60454) T-Urine Microalbumin w/creat. ratio [phone])  Labs Reviewed: Creat: 0.80 (11/17/2009)    Reviewed HgBA1c results: 8.3 (11/17/2009)labs past due  6.5 (05/26/2009)  Problem # 2:  NECK PAIN, CHRONIC (ICD-723.1) Assessment: Unchanged awaiting surgery  Problem # 3:  HYPERTENSION (ICD-401.9) Assessment: Deteriorated  The following medications were removed from the medication list:    Clonidine Hcl 0.2 Mg Tabs (Clonidine hcl) .Marland Kitchen... Take 1 tab by mouth at bedtime His updated medication list for this problem includes:    Lotrel 10-40 Mg Caps (Amlodipine besy-benazepril hcl) .Marland Kitchen... 1 daily for high blood pressure    Triamterene-hctz 75-50 Mg Tabs (Triamterene-hctz) .Marland Kitchen... Take 1 tablet by mouth once a  day  Orders: T-Basic Metabolic Panel ([id number])  BP today: 158/110 Prior BP: 144/100 (02/18/2010)  Labs Reviewed: K+: 4.3 (11/17/2009) Creat: : 0.80 (11/17/2009)   Chol: 183 (11/17/2009)   HDL: 40 (11/17/2009)   LDL: 110 (11/17/2009)   TG: 164 (11/17/2009)  Problem # 4:  HYPERLIPIDEMIA (ICD-272.4) Assessment: Comment Only  The following medications were removed from the medication list:    Lovastatin 40 Mg Tabs (Lovastatin) .Marland Kitchen... Take 1 tab by mouth at bedtime Low fat dietdiscussed and encouraged  Orders: T-Hepatic Function [phone]) T-Lipid Profile [phone])  Labs Reviewed: SGOT: 14 (10/16/2007)   SGPT: 19 (10/16/2007)   HDL:40 (11/17/2009), 42 (10/16/2007)  LDL:110 (11/17/2009), 139 (10/16/2007)  Chol:183 (11/17/2009), 222 (10/16/2007)  Trig:164 (11/17/2009), 207 (10/16/2007)  Complete Medication List: 1)  Lotrel 10-40 Mg Caps (Amlodipine besy-benazepril hcl) .Marland Kitchen.. 1 daily for high blood pressure 2)  Triamterene-hctz 75-50 Mg Tabs (Triamterene-hctz) .... Take 1 tablet by mouth once a day 3)  Onetouch Test Strp (Glucose blood) .... Once daily testing dx:250.00 4)  Onetouch Delica Lancets Misc (Lancets) .... Once daily testing dx:250.00  Other Orders: T-CBC w/Diff [phone]) T-TSH [id number]) T-PSA [phone]) T-Vitamin D (25-Hydroxy) [phone])  Patient Instructions: 1)  nurse visit in 2 weekds for bP check. 2)  mD f/u in 6 weeks. 3)  Your bP is high, new additional med sent in for bP take both together at the same time every morning. 4)  Tobacco is very bad for your health and your loved ones! You Should stop smoking!. 5)  Stop Smoking Tips: Choose a Quit date. Cut down before the Quit date. decide what you will do as a substitute when you feel the urge to smoke(gum,toothpick,exercise). 6)  It is important that you exercise regularly at least 20 minutes 5 times a week. If you develop chest pain, have severe difficulty breathing, or feel very tired ,  stop exercising immediately and seek medical attention.current is 10/day 7)  You need to lose weight. Consider a lower calorie diet and regular exercise.  8)  BMP prior to visit, ICD-9: 9)  Hepatic Panel prior to visit, ICD-9: 10)  Lipid Panel prior to visit, ICD-9: 11)  TSH prior to visit, ICD-9:   today 12)  CBC w/ Diff prior to visit, ICD-9: 13)  PSA prior to visit, ICD-9: 14)  HbgA1C prior to visit, ICD-9: 15)  Vitamin d 16)  Urine Microalbumin prior to visit, ICD-9: Prescriptions: ONETOUCH DELICA LANCETS  MISC (LANCETS) once daily testing dx:250.00  #100 x 2   Entered by:   Adella Hare LPN   Authorized by:   Syliva Overman MD   Signed by:   Adella Hare LPN on 54/27/0623   Method used:   Electronically to        [facility]  [address] (retail)       [address]       [place], [place]  [address]  Ph: [phone]       Fax: [phone]   RxID:   [id number] ONETOUCH TEST  STRP (GLUCOSE BLOOD) once daily testing dx:250.00  #100 x 2   Entered by:   Adella Hare LPN   Authorized by:   Syliva Overman MD   Signed by:   Adella Hare LPN on 09/13/7251   Method used:   Electronically to        [facility]  [address] (retail)       [address]       [place], [place]  [address]       Ph: [phone]       Fax: [phone]   RxID:   [id number] TRIAMTERENE-HCTZ 75-50 MG TABS (TRIAMTERENE-HCTZ) Take 1 tablet by mouth once a day  #30 x 1   Entered and Authorized by:   Syliva Overman MD   Signed by:   Syliva Overman MD on 11/28/2010   Method used:   Electronically to        [facility]  [address] (retail)       [address]       [place], [place]  [address]       Ph: [phone]       Fax: [phone]   RxID:   [id number]    Orders Added: 1)  Est. Patient Level IV [94[address]] 2)  T-Basic Metabolic Panel [62703-50093] 3)  T-Hepatic Function [80076-22960] 4)  T-Lipid Profile [80061-22930] 5)  T-CBC  w/Diff [81829-93716] 6)  T-TSH [96789-38101] 7)  T- Hemoglobin A1C [83036-23375] 8)  T-PSA [75102-58527] 9)  T-Urine Microalbumin w/creat. ratio [82043-[address]] 10)  T-Vitamin D (25-Hydroxy) [phone]

## 2010-12-08 NOTE — Miscellaneous (Signed)
  Clinical Lists Changes  Medications: Added new medication of METFORMIN HCL 500 MG TABS (METFORMIN HCL) Take 1 tablet by mouth two times a day - Signed Added new medication of PRAVASTATIN SODIUM 40 MG TABS (PRAVASTATIN SODIUM) Take 1 tab by mouth at bedtime - Signed Rx of METFORMIN HCL 500 MG TABS (METFORMIN HCL) Take 1 tablet by mouth two times a day;  #60 x 3;  Signed;  Entered by: Syliva Overman MD;  Authorized by: Syliva Overman MD;  Method used: Historical Rx of PRAVASTATIN SODIUM 40 MG TABS (PRAVASTATIN SODIUM) Take 1 tab by mouth at bedtime;  #30 x 3;  Signed;  Entered by: Syliva Overman MD;  Authorized by: Syliva Overman MD;  Method used: Historical    Prescriptions: PRAVASTATIN SODIUM 40 MG TABS (PRAVASTATIN SODIUM) Take 1 tab by mouth at bedtime  #30 x 3   Entered and Authorized by:   Syliva Overman MD   Signed by:   Syliva Overman MD on 11/29/2010   Method used:   Historical   RxID:   [id number] METFORMIN HCL 500 MG TABS (METFORMIN HCL) Take 1 tablet by mouth two times a day  #60 x 3   Entered and Authorized by:   Syliva Overman MD   Signed by:   Syliva Overman MD on 11/29/2010   Method used:   Historical   RxID:   [id number]

## 2010-12-12 ENCOUNTER — Ambulatory Visit (INDEPENDENT_AMBULATORY_CARE_PROVIDER_SITE_OTHER): Payer: PRIVATE HEALTH INSURANCE | Admitting: Family Medicine

## 2010-12-12 VITALS — BP 116/84 | Wt 304.0 lb

## 2010-12-12 DIAGNOSIS — I10 Essential (primary) hypertension: Secondary | ICD-10-CM

## 2010-12-12 NOTE — Progress Notes (Signed)
Patient advised no med changes at this time  Viagra samples given [id number] 7/15

## 2011-01-03 ENCOUNTER — Encounter: Payer: Self-pay | Admitting: Family Medicine

## 2011-01-06 ENCOUNTER — Encounter: Payer: Self-pay | Admitting: Family Medicine

## 2011-01-09 ENCOUNTER — Ambulatory Visit: Payer: PRIVATE HEALTH INSURANCE | Admitting: Family Medicine

## 2011-01-27 NOTE — Op Note (Signed)
NAME:  Dakota Mendez, Dakota Mendez                         ACCOUNT NO.:  [id number]   MEDICAL RECORD NO.:  [id number]                   PATIENT TYPE:  INP   LOCATION:  A223                                 FACILITY:  [facility]   PHYSICIAN:  Lionel December, M.D.                 DATE OF BIRTH:  Aug 06, 1961   DATE OF PROCEDURE:  03/17/2003  DATE OF DISCHARGE:                                 OPERATIVE REPORT   PROCEDURE:  Esophagogastroduodenoscopy.   INDICATIONS:  [name] is a 50 year old African-American male who presents with  melena, hematemesis, and epigastric pain.  His hemoglobin is around 12 g.  He is undergoing a diagnostic procedure.  The procedure was reviewed with  the patient and informed consent was obtained.   PREMEDICATION:  Cetacaine spray for pharyngeal topical anesthesia, Demerol  50 mg IV, Versed 8 mg IV in divided dose.   INSTRUMENT USED:  Olympus video system.   FINDINGS:  Procedure performed in endoscopy suite.  The patient's vital  signs and O2 saturation were monitored during the procedure and remained  stable.  The patient was placed in the left lateral recumbent position and  the endoscope was passed via oropharynx without any difficulty into  esophagus.   Esophagus:  Mucosa of the proximal and middle third was normal.  The distal  3 cm had multiple erosions along with two right at the GE junction.  No  hernia was noted.  The squamocolumnar junction was serrated.   Stomach:  It was empty and distended very well with insufflation.  Folds of  proximal stomach were normal.  Examination of the mucosa revealed a few  erosions at antrum and a small prepyloric ulcer toward the anterior wall.  It had a clean base.  The mucosa at pyloric channel was erythematous and  edematous, but it was patent.  Angularis, fundus, and cardia were normal.   Duodenum:  Examination of the bulb revealed normal mucosa.  The scope was  passed to the second part of the duodenum, and the mucosa and folds  are  normal.  The scope was withdrawn.  The patient tolerated the procedure well.   FINAL DIAGNOSES:  1. Erosive reflux esophagitis.  2. Erosive gastritis with a small prepyloric ulcer and pyloric channel     inflammation.  3. Suspect blood loss secondary to peptic ulcer disease.    RECOMMENDATIONS:  1. Will increase his Protonix to 40 mg b.i.d., which will be given p.o.  2. H. pylori serology will be checked.  3. Will advance his diet.                                               Lionel December, M.D.    NR/MEDQ  D:  03/17/2003  T:  03/17/2003  Job:  [id number]   cc:   Sarita Bottom, M.D.

## 2011-01-27 NOTE — H&P (Signed)
NAME:  Dakota Mendez, Dakota Mendez                         ACCOUNT NO.:  [id number]   MEDICAL RECORD NO.:  [id number]                   PATIENT TYPE:  INP   LOCATION:  A223                                 FACILITY:  [facility]   PHYSICIAN:  Gracelyn Nurse, M.D.              DATE OF BIRTH:  [birth year]   DATE OF ADMISSION:  03/16/2003  DATE OF DISCHARGE:                                HISTORY & PHYSICAL   CHIEF COMPLAINT:  Abdominal pain.   HISTORY OF PRESENT ILLNESS:  This is a 50 year old black male with a history  of gastroesophageal reflux disease.  He has noted for the past two days some  worsening epigastric pain and some dark tarry stools.  Today he has felt  like his food was stuck in his stomach so he induced vomiting.  His emesis  was coffee-grounds-looking.  He also felt kind of lightheaded.  He denies  any aspirin use or NSAID use.  He also denies alcohol use.   PAST MEDICAL HISTORY:  1. Hypertension.  2. GERD.  3. No surgeries.   ALLERGIES:  No known drug allergies.   CURRENT MEDICATIONS:  1. Diovan 160/12.5 mg, 2 daily.  2. Rolaids p.r.n.   SOCIAL HISTORY:  He smokes one pack of cigarettes a day.  Does not drink  alcohol.  He is married, with two children.  He is a Teaching laboratory technician.   FAMILY HISTORY:  Mother, age 53, has coronary artery disease.  Father died,  age 69, from alcohol abuse.   REVIEW OF SYSTEMS:  As per HPI he does have chronic reflux symptoms.  Remainder of systems negative.   PHYSICAL EXAMINATION:  VITAL SIGNS:  Temperature is 98.3, pulse 119,  respirations 20, blood pressure 85/66.  GENERAL:  This is an obese black male in no acute distress.  HEENT:  Pupils are equal, round, and reactive to light.  Extraocular  movements intact.  Oral mucosa is dry.  Oropharynx is clear.  CARDIOVASCULAR:  Tachycardic.  No murmurs.  LUNGS:  Clear to auscultation.  ABDOMEN:  Obese, nontender, nondistended.  Bowel sounds are positive.  EXTREMITIES:  There is 1+  lower extremity edema.  NEUROLOGIC:  Cranial nerves II-XII grossly intact.  No focal deficits.  SKIN:  Moist.  With no rash.  RECTAL:  Guaiac-positive.   ADMISSION LABORATORIES:  White blood count 17.5, hemoglobin is 12.7,  platelets 325.  Sodium 135, potassium 3.8, chloride 104, CO2 28, BUN 25,  creatinine 1.3, glucose 110.   ASSESSMENT AND PLAN:  1. Gastrointestinal bleeding:  Suspect an upper GI bleed.  Will admit him,     start a PPI, make him n.p.o.  I will go ahead and cross and type him.     Consult GI for upper endoscopy.  His hemoglobin is stable for now.     However, with his symptoms I suspect it will go lower so we will follow  up another hemoglobin check in a few hours.  2. Prerenal azotemia:  This is likely secondary to hypovolemia from the GI     bleeding.  Will go ahead and give him IV fluids and blood if necessary.     Recheck his renal function in the morning.  3. Hypotension:  This is secondary to hypovolemia.  He has responded to IV     fluids in the ER.  Will continue this.  4. Hypertension:  Will hold his medications for now since he is a little     hypotensive.  5. Gastroesophageal reflux disease:  He will be on a PPI.  These could have     been symptoms of an ulcer also.  6. Leukocytosis:  This may all be stress demargination from his GI bleeding;     however, will check a UA and a chest x-ray.                                               Gracelyn Nurse, M.D.    JDJ/MEDQ  D:  03/16/2003  T:  03/17/2003  Job:  [id number]

## 2011-01-27 NOTE — Procedures (Signed)
NAME:  Dakota Mendez, Dakota Mendez NO.:  [id number]   MEDICAL RECORD NO.:  [id number]          PATIENT TYPE:  OUT   LOCATION:  RAD                           FACILITY:  [facility]   PHYSICIAN:  Vida Roller, M.D.   DATE OF BIRTH:  February 24, 1961   DATE OF PROCEDURE:  10/23/2005  DATE OF DISCHARGE:                                  ECHOCARDIOGRAM   PRIMARY:  Dr. Syliva Overman   TODAY'S DATE:  October 23, 2005   CLINICAL HISTORY:  This is a 50 year old man for LV systolic function who  has a murmur and history of hypertension.   TAPE NUMBER:  [id number].   TAPE COUNT:  [id number].   TECHNICAL QUALITY OF THE STUDY:  Adequate.   M-MODE TRACINGS:  The aorta is 31 mm.   Left atrium is 40 mm.   Septum is 10 mm.   Posterior wall is 10 mm.   Left ventricular diastolic dimension is 46 mm.   Left ventricular systolic dimension 37 mm.   2-D AND DOPPLER IMAGING:  The left ventricle is normal size. There are no  wall motion abnormalities. The overall ejection fraction is 55-60%.   The right ventricle is top normal in size. There is some question of a mild  amount of free wall hypertrophy though this is not well studied. There is  normal systolic function.   Both atria are top normal in size.   The aortic valve appears to be trileaflet with no significant stenosis or  regurgitation.   The mitral valve has no significant stenosis or regurgitation and is  delicate.   The tricuspid valve is also delicate and has mild regurgitation. No stenosis  is seen.   There is no pericardial effusion.   The inferior vena cava and ascending aorta were not well seen.      Vida Roller, M.D.  Electronically Signed     JH/MEDQ  D:  10/23/2005  T:  10/23/2005  Job:  [id number]

## 2011-01-27 NOTE — Discharge Summary (Signed)
NAME:  Dakota Mendez, Dakota Mendez                         ACCOUNT NO.:  [id number]   MEDICAL RECORD NO.:  [id number]                   PATIENT TYPE:  INP   LOCATION:  A223                                 FACILITY:  [facility]   PHYSICIAN:  Sarita Bottom, M.D.                  DATE OF BIRTH:  04/30/1961   DATE OF ADMISSION:  03/16/2003  DATE OF DISCHARGE:  03/18/2003                                 DISCHARGE SUMMARY   FINAL DIAGNOSES:  1. Gastrointestinal bleed secondary to gastric ulcer.  2. Erosive esophagitis and gastritis.  3. Hypertension, resolved.   DISCHARGE MEDICATIONS:  1. Protonix 40 b.i.d.  2. Patient to hold preadmission anti-blood pressure medication until review     by primary M.D.   Rinaldo Ratel UP:  Recommended H. pylori serology by primary M.D.   DISPOSITION:  The patient will be discharged home.   DISCHARGE CONDITION:  Stable.   HISTORY OF PRESENT ILLNESS:  Dakota Mendez is a 50 year old man with history  of gastroesophageal reflux disease and high blood pressure.  Was admitted to  [facility] on the 5th of this month when he presented with a  complaint of hematemesis after he self-induced vomiting.   PHYSICAL EXAMINATION:  VITAL SIGNS:  Blood pressure 85/66, heart rate 119.  GENERAL:  A young pleasant man not in any distress.  CHEST:  Clear to auscultation.  ABDOMEN:  Benign.  CNS:  Awake, alert and oriented x3.  RECTAL:  Guaiac positive stools.   LABORATORY DATA:  WBC 17.5, hemoglobin 12.7.  BUN 25, creatinine 1.3.   HOSPITAL COURSE:  The patient was admitted with an impression of  gastrointestinal bleed, hypertension and gastroesophageal reflux disease.  He was put on Protonix and was resuscitated with IV fluids.  His hemoglobin  and hematocrit was monitored.  The patient's course was essentially  unremarkable for any new GI bleed.  He was seen by GI consult.  An EGD was  done which revealed gastric erosions and esophageal erosions.  He had a  small prepyloric  ulcer.  The patient was thought to have bled from that  ulcer.  The patient was continued on Protonix.  He has been seen on rounds  this morning.  He has no new complaints.  Vital signs are stable and  physical  examination essentially intact.  His greatest hemoglobin is 10.2, hematocrit  31.0.  WBC 11, BUN 17, creatinine 1.1.   The patient will be discharged home today to follow up with primary M.D.,  Dr. Lodema Hong.                                                Sarita Bottom, M.D.    DW/MEDQ  D:  03/18/2003  T:  03/18/2003  Job:  [id number]  cc:   Dr. Lodema Hong

## 2011-01-27 NOTE — Consult Note (Signed)
NAME:  Dakota Mendez, Dakota Mendez                         ACCOUNT NO.:  [id number]   MEDICAL RECORD NO.:  [id number]                   PATIENT TYPE:  INP   LOCATION:  A223                                 FACILITY:  [facility]   PHYSICIAN:  Lionel December, M.D.                 DATE OF BIRTH:  1961-02-28   DATE OF CONSULTATION:  03/17/2003  DATE OF DISCHARGE:                                   CONSULTATION   PHYSICIAN REQUESTING CONSULTATION:  Dr. Hoover Brunette.   REASON FOR CONSULTATION:  GI bleed.   HISTORY:  The patient is a 50 year old black gentleman who presented to the  hospital secondary to lightheadedness.  He gives a 7-day history of  progressively worsening epigastric pain.  A couple of days ago he felt like  food was lodged in his lower esophageal region/epigastric region.  He  induced vomiting and noted coffee-grounds emesis.  He also started passing  black, tarry stools.  He has chronic typical reflux symptoms.  Takes Rolaids  p.r.n.  Denies any NSAIDs or aspirin use.  He does not consume alcohol.  He  gives a history of gastric pain a couple of years ago which resolved on  Nexium.  Bowels moving regularly.  He denies any diarrhea, constipation,  dysphagia, odynophagia.   In the emergency department he was noted to be tachycardic and hypotensive.  On rectal examination stool was black, tarry, Hemoccult-positive.  WBC was  17.5, hemoglobin 12.7, hematocrit 36.3.  BUN 25, creatinine 1.3.  Late last  night his hemoglobin was 11.2, hematocrit 32.6.  Today his hemoglobin is  12.2, his hematocrit 35.7, WBC 11.  He is slightly hypotensive but heart  rate normal in the 90s.   MEDICATIONS PRIOR TO ADMISSION:  1. Rolaids p.r.n.  2. Diovan 160/12.5 mg, 2 daily.   ALLERGIES:  No known drug allergies.   PAST MEDICAL HISTORY:  1. Chronic gastroesophageal reflux disease.  2. Hypertension.   FAMILY HISTORY:  Father died of alcoholic cirrhosis secondary to alcohol  abuse.  Mother died of CAD at age  37.  He had a sister with a history of  peptic ulcer disease.   SOCIAL HISTORY:  He is married, has two children.  He smokes one pack of  cigarettes daily.  Denies any alcohol use.  He is a Teaching laboratory technician.   REVIEW OF SYSTEMS:  Please see HPI for GI.  GENERAL:  Denies any weight  loss.  CARDIOPULMONARY:  Denies any chest pain or shortness of breath.  Denies any cough.  GENITOURINARY:  Denies any dysuria.   PHYSICAL EXAMINATION:  VITAL SIGNS:  Height 6 feet, weight 296.6.  Temperature 98, pulse 95, respirations 20, blood pressure 91/56.  GENERAL:  Pleasant, obese black male in no acute distress.  SKIN:  Warm and dry.  No jaundice.  HEENT:  Conjunctivae are pink.  Sclerae nonicteric.  Oropharyngeal mucosa  moist and pink.  No lesions or erythema or exudate.  No lymphadenopathy.  CHEST:  Clear to auscultation.  CARDIAC:  Regular rate and rhythm.  Normal S1, S2.  No murmurs, rubs,  gallops.  ABDOMEN:  Positive bowel sounds.  Obese but symmetrical.  Soft.  Nontender.  No organomegaly or masses appreciated.  EXTREMITIES:  Lower extremities, no edema.   LABORATORIES:  As mentioned in HPI.  In addition, PT 13.6, INR 1, PTT 32.  Platelets 325,000.  Sodium 135, potassium 3.8, glucose 110, total bilirubin  0.8, alkaline phosphatase 59, SGOT 26, SGPT 24, albumin 3.2.  Negative  urinalysis.  Calcium 8.4.   IMPRESSION:  The patient is a pleasant 50 year old gentleman who presents  with suspected upper gastrointestinal bleed given history of epigastric  pain, melena, and coffee-grounds emesis.  He has a mild anemia.  Suspect he  has peptic ulcer disease or erosive/ulcerative reflux esophagitis.  He does  give a history of chronic gastroesophageal reflux disease symptoms.  I doubt  we are dealing with Mallory-Weiss tear.  He had only one episode of emesis,  which was induced.   SUGGESTIONS:  1. EGD.  2. Agree with IV Protonix.   I would like to thank Dr. Hoover Brunette for allowing [place]  to take part in the care of  this patient.     Tana Coast, P.A.                        Lionel December, M.D.    LL/MEDQ  D:  03/17/2003  T:  03/17/2003  Job:  [id number]   cc:   Lionel December, M.D.  [id number]  [place]  [place] [address]  Fax: [phone]

## 2011-04-05 ENCOUNTER — Ambulatory Visit (INDEPENDENT_AMBULATORY_CARE_PROVIDER_SITE_OTHER): Payer: Medicare Other | Admitting: Family Medicine

## 2011-04-05 ENCOUNTER — Encounter: Payer: Self-pay | Admitting: Family Medicine

## 2011-04-05 VITALS — BP 140/100 | HR 125 | Resp 16 | Ht 72.0 in | Wt 297.0 lb

## 2011-04-05 DIAGNOSIS — M549 Dorsalgia, unspecified: Secondary | ICD-10-CM

## 2011-04-05 DIAGNOSIS — E785 Hyperlipidemia, unspecified: Secondary | ICD-10-CM

## 2011-04-05 DIAGNOSIS — I10 Essential (primary) hypertension: Secondary | ICD-10-CM

## 2011-04-05 DIAGNOSIS — E119 Type 2 diabetes mellitus without complications: Secondary | ICD-10-CM

## 2011-04-05 DIAGNOSIS — R9431 Abnormal electrocardiogram [ECG] [EKG]: Secondary | ICD-10-CM | POA: Insufficient documentation

## 2011-04-05 DIAGNOSIS — F17200 Nicotine dependence, unspecified, uncomplicated: Secondary | ICD-10-CM

## 2011-04-05 DIAGNOSIS — Z23 Encounter for immunization: Secondary | ICD-10-CM

## 2011-04-05 DIAGNOSIS — R000 Tachycardia, unspecified: Secondary | ICD-10-CM

## 2011-04-05 MED ORDER — BENAZEPRIL HCL 10 MG PO TABS: 10.0000 mg | ORAL_TABLET | Freq: Every day | ORAL | Status: DC

## 2011-04-05 MED ORDER — AMLODIPINE BESYLATE 10 MG PO TABS: 10.0000 mg | ORAL_TABLET | Freq: Every day | ORAL | Status: DC

## 2011-04-05 NOTE — Patient Instructions (Addendum)
F/u in 6 weeks.Your blood Ursula Alert is too high  New medications for your blood pressure, continue the maxzide.    You need to take the cholesterol medication prescribed.  hBA1c , chem 7 today   Please think about quitting smoking.  This is very important for your health.  Consider setting a quit date, then cutting back or switching brands to prepare to stop.  Also think of the money you will save every day by not smoking.  Quick Tips to Quit Smoking: Fix a date i.e. keep a date in mind from when you would not touch a tobacco product to smoke  Keep yourself busy and block your mind with work loads or reading books or watching movies in malls where smoking is not allowed  Vanish off the things which reminds you about smoking for example match box, or your favorite lighter, or the pipe you used for smoking, or your favorite jeans and shirt with which you used to enjoy smoking, or the club where you used to do smoking  Try to avoid certain people places and incidences where and with whom smoking is a common factor to add on  Praise yourself with some token gifts from the money you saved by stopping smoking  Anti Smoking teams are there to help you. Join their programs  Anti-smoking Gums are there in many medical shops. Try them to quit smoking   Side-effects of Smoking: Disease caused by smoking cigarettes are emphysema, bronchitis, heart failures  Premature death  Cancer is the major side effect of smoking  Heart attacks and strokes are the quick effects of smoking causing sudden death  Some smokers lives end up with limbs amputated  Breathing problem or fast breathing is another side effect of smoking  Due to more intakes of smokes, carbon mono-oxide goes into your brain and other muscles of the body which leads to swelling of the veins and blockage to the air passage to lungs  Carbon monoxide blocks blood vessels which leads to blockage in the flow of blood to different major body  organs like heart lungs and thus leads to attacks and deaths  During pregnancy smoking is very harmful and leads to premature birth of the infant, spontaneous abortions, low weight of the infant during birth  Fat depositions to narrow and blocked blood vessels causing heart attacks  In many cases cigarette smoking caused infertility in men    You will be referred to cardiology since your EKG is not normal, heart rate is fast and you are diabetic, smoke and have high cholesterol. You really need to stiop smoking

## 2011-04-06 ENCOUNTER — Telehealth: Payer: Self-pay | Admitting: Family Medicine

## 2011-04-06 LAB — BASIC METABOLIC PANEL
BUN: 20 mg/dL (ref 6–23)
CO2: 21 mEq/L (ref 19–32)
Calcium: 10.1 mg/dL (ref 8.4–10.5)
Chloride: 99 mEq/L (ref 96–112)
Creat: 1.33 mg/dL (ref 0.50–1.35)
Glucose, Bld: 106 mg/dL — ABNORMAL HIGH (ref 70–99)
Potassium: 4.4 mEq/L (ref 3.5–5.3)
Sodium: 137 mEq/L (ref 135–145)

## 2011-04-06 LAB — HEMOGLOBIN A1C
Hgb A1c MFr Bld: 6.2 % — ABNORMAL HIGH (ref <5.7)
Mean Plasma Glucose: 131 mg/dL — ABNORMAL HIGH (ref <117)

## 2011-04-06 NOTE — Telephone Encounter (Signed)
Patient is inquiring about the patches for smoking cessation. Does insurance cover these? Are they OTC or prescription only?

## 2011-04-06 NOTE — Telephone Encounter (Signed)
I do not know, if covered, he needs to ask his insurance company, they are OTC also I believe, he is not to smoke when using the patches, pls let him know

## 2011-04-06 NOTE — Telephone Encounter (Signed)
Patient aware.

## 2011-04-07 ENCOUNTER — Telehealth: Payer: Self-pay | Admitting: Family Medicine

## 2011-04-07 NOTE — Telephone Encounter (Signed)
Patient aware labs are normal.

## 2011-04-07 NOTE — Telephone Encounter (Signed)
Called patient, left message.

## 2011-04-11 ENCOUNTER — Encounter: Payer: Self-pay | Admitting: Family Medicine

## 2011-04-17 ENCOUNTER — Ambulatory Visit: Payer: Medicare Other | Admitting: Cardiology

## 2011-04-17 ENCOUNTER — Encounter: Payer: Self-pay | Admitting: Family Medicine

## 2011-04-17 NOTE — Assessment & Plan Note (Signed)
Unchanged, weight loss encouraged to reduce symptoms

## 2011-04-17 NOTE — Assessment & Plan Note (Signed)
Medication compliance addressed. Commitment to regular exercise and healthy  food choices, with portion control discussed. DASH diet and low fat diet discussed and literature offered. Changes in medication made at this visit.  

## 2011-04-17 NOTE — Assessment & Plan Note (Signed)
Deteriorated and uncontrolled, the importance of dietary change also stressed

## 2011-04-17 NOTE — Progress Notes (Signed)
  Subjective:    Patient ID: Dakota Mendez, male    DOB: 06/13/1961, 51 y.o.   MRN: [id number]  HPI The PT is here for follow up and re-evaluation of chronic medical conditions, medication management and review of any available recent lab and radiology data.  Preventive health is updated, specifically  Cancer screening and Immunization.   Questions or concerns regarding consultations or procedures which the PT has had in the interim are  addressed. The PT denies any adverse reactions to current medications since the last visit.  There are no new concerns.  Still experiencing neck and upper extremity pain, anticipates surgery for this at some stage Recent;ly seen in the Ed with elevated bP    Review of Systems Denies recent fever or chills. Denies sinus pressure, nasal congestion, ear pain or sore throat. Denies chest congestion, productive cough or wheezing. Denies chest pains, palpitations and leg swelling Denies abdominal pain, nausea, vomiting,diarrhea or constipation.   Denies dysuria, frequency, hesitancy or incontinence. Denies joint pain, swelling and limitation in mobility. Denies headaches, seizures, numbness, or tingling. Denies depression, anxiety or insomnia. Denies skin break down or rash.        Objective:   Physical Exam Patient alert and oriented and in no cardiopulmonary distress.  HEENT: No facial asymmetry, EOMI, no sinus tenderness,  oropharynx pink and moist.  Neck decreased ROM no adenopathy.  Chest: Clear to auscultation bilaterally.  CVS: S1, S2 no murmurs, no S3.  ABD: Soft non tender. Bowel sounds normal.  Ext: No edema  QI:[id number]  ROM spine adequate in  shoulders, hips and knees.  Skin: Intact, no ulcerations or rash noted.  Psych: Good eye contact, normal affect. Memory intact not anxious or depressed appearing.  CNS: CN 2-12 intact, power, tone and sensation normal throughout.        Assessment & Plan:

## 2011-04-17 NOTE — Assessment & Plan Note (Signed)
Adequate control though pt not taking meds as prescribed regularly

## 2011-04-17 NOTE — Assessment & Plan Note (Signed)
Unchanged, the importance of smoking cessation stressed

## 2011-04-17 NOTE — Assessment & Plan Note (Signed)
Pt has multiple CV risk factors, abn ekg and tachycardia, will refer to cardiology for further eval. He has a f/h of stroke

## 2011-05-17 ENCOUNTER — Ambulatory Visit (INDEPENDENT_AMBULATORY_CARE_PROVIDER_SITE_OTHER): Payer: Medicare Other | Admitting: Family Medicine

## 2011-05-17 ENCOUNTER — Encounter: Payer: Self-pay | Admitting: Family Medicine

## 2011-05-17 VITALS — BP 140/100 | HR 96 | Resp 16 | Ht 72.0 in | Wt 308.8 lb

## 2011-05-17 DIAGNOSIS — M549 Dorsalgia, unspecified: Secondary | ICD-10-CM

## 2011-05-17 DIAGNOSIS — Z23 Encounter for immunization: Secondary | ICD-10-CM

## 2011-05-17 DIAGNOSIS — Z79899 Other long term (current) drug therapy: Secondary | ICD-10-CM

## 2011-05-17 DIAGNOSIS — E669 Obesity, unspecified: Secondary | ICD-10-CM

## 2011-05-17 DIAGNOSIS — E119 Type 2 diabetes mellitus without complications: Secondary | ICD-10-CM

## 2011-05-17 DIAGNOSIS — F17200 Nicotine dependence, unspecified, uncomplicated: Secondary | ICD-10-CM

## 2011-05-17 DIAGNOSIS — E785 Hyperlipidemia, unspecified: Secondary | ICD-10-CM

## 2011-05-17 DIAGNOSIS — I10 Essential (primary) hypertension: Secondary | ICD-10-CM

## 2011-05-17 DIAGNOSIS — B351 Tinea unguium: Secondary | ICD-10-CM | POA: Insufficient documentation

## 2011-05-17 MED ORDER — TERBINAFINE HCL 250 MG PO TABS: 250.0000 mg | ORAL_TABLET | Freq: Every day | ORAL | Status: DC

## 2011-05-17 MED ORDER — HYDROCODONE-ACETAMINOPHEN 5-500 MG PO TABS: ORAL_TABLET | ORAL | Status: AC

## 2011-05-17 MED ORDER — BENAZEPRIL HCL 20 MG PO TABS: 20.0000 mg | ORAL_TABLET | Freq: Every day | ORAL | Status: DC

## 2011-05-17 MED ORDER — INFLUENZA VAC TYPES A & B PF IM SUSP: 0.5000 mL | Freq: Once | INTRAMUSCULAR | Status: DC

## 2011-05-17 NOTE — Patient Instructions (Signed)
cPE in 3.5  months.  New medication to be started after liver test is checked.  Lipid and hepatic today.  Blood pressure is high, increased dose  Of benazepril to 20mg  one daily, take tWO 10 mg tablets once daily till done   Fasting lipid cmp and eGFR and hBA1c

## 2011-05-18 LAB — HEPATIC FUNCTION PANEL
ALT: 14 U/L (ref 0–53)
AST: 14 U/L (ref 0–37)
Albumin: 3.8 g/dL (ref 3.5–5.2)
Alkaline Phosphatase: 74 U/L (ref 39–117)
Bilirubin, Direct: 0.1 mg/dL (ref 0.0–0.3)
Indirect Bilirubin: 0.5 mg/dL (ref 0.0–0.9)
Total Bilirubin: 0.6 mg/dL (ref 0.3–1.2)
Total Protein: 7.1 g/dL (ref 6.0–8.3)

## 2011-05-18 LAB — LIPID PANEL
Cholesterol: 195 mg/dL (ref 0–200)
HDL: 35 mg/dL — ABNORMAL LOW (ref >39)
LDL Cholesterol: 110 mg/dL — ABNORMAL HIGH (ref 0–99)
Total CHOL/HDL Ratio: 5.6 Ratio
Triglycerides: 250 mg/dL — ABNORMAL HIGH (ref <150)
VLDL: 50 mg/dL — ABNORMAL HIGH (ref 0–40)

## 2011-05-22 NOTE — Progress Notes (Signed)
  Subjective:    Patient ID: Dakota Mendez, male    DOB: 1961/08/15, 50 y.o.   MRN: [id number]  HPI The PT is here for follow up and re-evaluation of chronic medical conditions, medication management and review of any available recent lab and radiology data.  Preventive health is updated, specifically  Cancer screening and Immunization.   Questions or concerns regarding consultations or procedures which the PT has had in the interim are  addressed. The PT denies any adverse reactions to current medications since the last visit.  C/o uncontrolled back pain and is requesting that his lortab script be filled. Still awaiting surgery      Review of Systems Denies recent fever or chills. Denies sinus pressure, nasal congestion, ear pain or sore throat. Denies chest congestion, productive cough or wheezing. Denies chest pains, palpitations and leg swelling Denies abdominal pain, nausea, vomiting,diarrhea or constipation.   Denies dysuria, frequency, hesitancy or incontinence.  Denies headaches, seizures, numbness, or tingling. Denies depression, anxiety or insomnia. Denies skin break down or rash.        Objective:   Physical Exam Patient alert and oriented and in no cardiopulmonary distress.  HEENT: No facial asymmetry, EOMI, no sinus tenderness,  oropharynx pink and moist.  Neck supple no adenopathy.  Chest: Clear to auscultation bilaterally.  CVS: S1, S2 no murmurs, no S3.  ABD: Soft non tender. Bowel sounds normal.  Ext: No edema  WU:[id number]  ROM spine,adequate in  shoulders, hips and knees.  Skin: Intact, no ulcerations or rash noted.  Psych: Good eye contact, normal affect. Memory intact not anxious or depressed appearing.  CNS: CN 2-12 intact, power, tone and sensation normal throughout.  Diabetic Foot Check:  Appearance - calluses Skin - tinea pedis and severe onychomycosis Sensation - grossly intact to light touch Monofilament testing -  Right - Great  toe, medial, central, lateral ball and posterior foot diminished Left - Great toe, medial, central, lateral ball and posterior foot diminished Pulses Left - Dorsalis Pedis and Posterior Tibia normal Right - Dorsalis Pedis and Posterior Tibia normal        Assessment & Plan:

## 2011-05-22 NOTE — Assessment & Plan Note (Signed)
Unchanged counseled to quit

## 2011-05-22 NOTE — Assessment & Plan Note (Signed)
Uncontrolled, med dose increased

## 2011-05-22 NOTE — Assessment & Plan Note (Signed)
Improved. Pt applauded on succesful weight loss through lifestyle change, and encouraged to continue same. Weight loss goal set for the next several months.  

## 2011-05-22 NOTE — Assessment & Plan Note (Signed)
Diet controlled, non compliant with daily medication

## 2011-05-22 NOTE — Assessment & Plan Note (Signed)
Low fat diet discussed and encouraged will review updated lab to determine management

## 2011-05-22 NOTE — Assessment & Plan Note (Signed)
Unchanged, pt to resume pain med as before, still awaiting surgery

## 2011-06-16 ENCOUNTER — Telehealth: Payer: Self-pay | Admitting: Family Medicine

## 2011-06-16 ENCOUNTER — Other Ambulatory Visit: Payer: Self-pay

## 2011-06-16 MED ORDER — TADALAFIL 20 MG PO TABS: 20.0000 mg | ORAL_TABLET | Freq: Every day | ORAL | Status: DC | PRN

## 2011-06-16 NOTE — Telephone Encounter (Signed)
Coming by to get coupon after lunch

## 2011-07-12 ENCOUNTER — Encounter: Payer: Self-pay | Admitting: Family Medicine

## 2011-07-17 ENCOUNTER — Encounter: Payer: Self-pay | Admitting: Family Medicine

## 2011-07-17 ENCOUNTER — Ambulatory Visit (INDEPENDENT_AMBULATORY_CARE_PROVIDER_SITE_OTHER): Payer: Medicare Other | Admitting: Family Medicine

## 2011-07-17 VITALS — BP 140/92 | HR 95 | Resp 16 | Ht 72.0 in | Wt 313.4 lb

## 2011-07-17 DIAGNOSIS — I10 Essential (primary) hypertension: Secondary | ICD-10-CM

## 2011-07-17 DIAGNOSIS — F17200 Nicotine dependence, unspecified, uncomplicated: Secondary | ICD-10-CM

## 2011-07-17 DIAGNOSIS — E119 Type 2 diabetes mellitus without complications: Secondary | ICD-10-CM

## 2011-07-17 DIAGNOSIS — E785 Hyperlipidemia, unspecified: Secondary | ICD-10-CM

## 2011-07-17 DIAGNOSIS — Z0000 Encounter for general adult medical examination without abnormal findings: Secondary | ICD-10-CM | POA: Insufficient documentation

## 2011-07-17 DIAGNOSIS — N529 Male erectile dysfunction, unspecified: Secondary | ICD-10-CM

## 2011-07-17 MED ORDER — VARDENAFIL HCL 20 MG PO TABS: 20.0000 mg | ORAL_TABLET | Freq: Every day | ORAL | Status: AC | PRN

## 2011-07-17 MED ORDER — METFORMIN HCL 1000 MG PO TABS: 1000.0000 mg | ORAL_TABLET | Freq: Every day | ORAL | Status: DC

## 2011-07-17 MED ORDER — BENAZEPRIL HCL 40 MG PO TABS: 40.0000 mg | ORAL_TABLET | Freq: Every day | ORAL | Status: DC

## 2011-07-17 NOTE — Patient Instructions (Addendum)
F/u as before  Dose increase on benazepril to 40mg  one daily, ok to take TWO 20mg  tabs daily till done  You need to start metformin 1000mg  take ONE daily.  You need to take the medication for cholesterol, lovastatin as prescribed.  Levitra is sent in place of cialis per your request  Have the surgeon who has taken you out of work since 2008 complete your work note per routine. I am unable to do this  Fasting lipid, cmp and EGFR and HBAQ1C in January prior to nex visit  It is important that you exercise regularly at least 30 minutes 5 times a week. If you develop chest pain, have severe difficulty breathing, or feel very tired, stop exercising immediately and seek medical attention    A healthy diet is rich in fruit, vegetables and whole grains. Poultry fish, nuts and beans are a healthy choice for protein rather then red meat. A low sodium diet and drinking 64 ounces of water daily is generally recommended. Oils and sweet should be limited. Carbohydrates especially for those who are diabetic or overweight, should be limited to 30-45 gram per meal. It is important to eat on a regular schedule, at least 3 times daily. Snacks should be primarily fruits, vegetables or nuts.

## 2011-07-17 NOTE — Assessment & Plan Note (Addendum)
Uncontrolled, not taking maxzide, causes cramps, will inc benazepril dose

## 2011-07-17 NOTE — Assessment & Plan Note (Signed)
Non compliant with meds, dose reduction in metformin, and compliance stressed

## 2011-07-17 NOTE — Progress Notes (Signed)
  Subjective:    Patient ID: Dakota Mendez, male    DOB: Jun 28, 1961, 50 y.o.   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 States he is in pain all the time, states he has limitation in upper extremity movement on the right side, and reports insatability when turning to the right as though he will fall. He states he wants the surgery, realizes he needs it, and though he has the outstanding balance he will try and negotiate terms to have his health taken care of and will request the surgeon to fill out the form   Review of Systems See HPI Denies recent fever or chills. Denies sinus pressure, nasal congestion, ear pain or sore throat. Denies chest congestion, productive cough or wheezing. Denies chest pains, palpitations and leg swelling Denies abdominal pain, nausea, vomiting,diarrhea or constipation.   Denies dysuria, frequency, hesitancy or incontinence.  Denies headaches, seizures, numbness, or tingling. Denies depression, anxiety or insomnia. Denies skin break down or rash.        Objective:   Physical Exam Patient alert and oriented and in no cardiopulmonary distress.  HEENT: No facial asymmetry, EOMI, no sinus tenderness,  oropharynx pink and moist.  Neck supple no adenopathy.  Chest: Clear to auscultation bilaterally.  CVS: S1, S2 no murmurs, no S3.  ABD: Soft non tender. Bowel sounds normal.  Ext: No edema  WU:[id number] ROM spine, adequate shoulders, hips and knees.  Skin: Intact, no ulcerations or rash noted.  Psych: Good eye contact, normal affect. Memory intact not anxious or depressed appearing.  CNS: CN 2-12 intact, power, tone and sensation normal throughout.        Assessment & Plan:    He has an outstanding balance before

## 2011-07-23 NOTE — Assessment & Plan Note (Signed)
Unchanged counseled to quit 

## 2011-08-25 ENCOUNTER — Other Ambulatory Visit: Payer: Self-pay | Admitting: Family Medicine

## 2011-09-13 ENCOUNTER — Ambulatory Visit: Payer: Self-pay | Admitting: Unknown Physician Specialty

## 2011-09-13 ENCOUNTER — Encounter: Payer: Self-pay | Admitting: Family Medicine

## 2011-09-18 ENCOUNTER — Ambulatory Visit: Payer: Self-pay | Admitting: Unknown Physician Specialty

## 2011-09-18 LAB — BASIC METABOLIC PANEL
Anion Gap: 9 (ref 7–16)
Calcium, Total: 8.8 mg/dL (ref 8.5–10.1)
Co2: 28 mmol/L (ref 21–32)
Creatinine: 1.14 mg/dL (ref 0.60–1.30)
EGFR (African American): >60
Glucose: 182 mg/dL — ABNORMAL HIGH (ref 65–99)
Osmolality: 289 (ref 275–301)

## 2011-09-18 LAB — URINALYSIS, COMPLETE
Bacteria: NONE SEEN
Bilirubin,UR: NEGATIVE
Ketone: NEGATIVE
Leukocyte Esterase: NEGATIVE
Nitrite: NEGATIVE
Ph: 5 (ref 4.5–8.0)
Protein: NEGATIVE
RBC,UR: 1 /HPF (ref 0–5)
Squamous Epithelial: NONE SEEN
WBC UR: 1 /HPF (ref 0–5)

## 2011-09-18 LAB — CBC
HCT: 42.7 % (ref 40.0–52.0)
HGB: 14.6 g/dL (ref 13.0–18.0)
MCH: 29.8 pg (ref 26.0–34.0)
MCHC: 34.3 g/dL (ref 32.0–36.0)
Platelet: 279 10*3/uL (ref 150–440)
RDW: 13.5 % (ref 11.5–14.5)

## 2011-09-21 ENCOUNTER — Ambulatory Visit: Payer: Self-pay | Admitting: Unknown Physician Specialty

## 2011-09-21 ENCOUNTER — Encounter: Payer: Medicare Other | Admitting: Family Medicine

## 2011-09-21 HISTORY — PX: SPINE SURGERY: SHX786

## 2011-11-09 ENCOUNTER — Encounter: Payer: Medicare Other | Admitting: Family Medicine

## 2012-03-18 ENCOUNTER — Ambulatory Visit (INDEPENDENT_AMBULATORY_CARE_PROVIDER_SITE_OTHER): Payer: Medicare Other | Admitting: Family Medicine

## 2012-03-18 ENCOUNTER — Encounter: Payer: Self-pay | Admitting: Family Medicine

## 2012-03-18 VITALS — BP 150/100 | HR 109 | Resp 16 | Ht 72.0 in | Wt 311.4 lb

## 2012-03-18 DIAGNOSIS — E785 Hyperlipidemia, unspecified: Secondary | ICD-10-CM

## 2012-03-18 DIAGNOSIS — Z0000 Encounter for general adult medical examination without abnormal findings: Secondary | ICD-10-CM

## 2012-03-18 DIAGNOSIS — Z1211 Encounter for screening for malignant neoplasm of colon: Secondary | ICD-10-CM

## 2012-03-18 DIAGNOSIS — F17200 Nicotine dependence, unspecified, uncomplicated: Secondary | ICD-10-CM

## 2012-03-18 DIAGNOSIS — N529 Male erectile dysfunction, unspecified: Secondary | ICD-10-CM

## 2012-03-18 DIAGNOSIS — I10 Essential (primary) hypertension: Secondary | ICD-10-CM

## 2012-03-18 DIAGNOSIS — E119 Type 2 diabetes mellitus without complications: Secondary | ICD-10-CM

## 2012-03-18 MED ORDER — TADALAFIL 5 MG PO TABS: 5.0000 mg | ORAL_TABLET | Freq: Every day | ORAL | Status: DC | PRN

## 2012-03-18 NOTE — Patient Instructions (Addendum)
F/u in 6 weeks  You will be called  About labs.  You need to restart both blood pressure medications, this is too high, benazepril and amlodipine. Goal of cigarettes  Is 15 per day in the next 6 weeks You are being sent in cialis 5mg  take two tablets before sexual activity as needed, maximum use is twice weekly   Please think about quitting smoking.  This is very important for your health.  Consider setting a quit date, then cutting back or switching brands to prepare to stop.  Also think of the money you will save every day by not smoking.  Quick Tips to Quit Smoking: Fix a date i.e. keep a date in mind from when you would not touch a tobacco product to smoke  Keep yourself busy and block your mind with work loads or reading books or watching movies in malls where smoking is not allowed  Vanish off the things which reminds you about smoking for example match box, or your favorite lighter, or the pipe you used for smoking, or your favorite jeans and shirt with which you used to enjoy smoking, or the club where you used to do smoking  Try to avoid certain people places and incidences where and with whom smoking is a common factor to add on  Praise yourself with some token gifts from the money you saved by stopping smoking  Anti Smoking teams are there to help you. Join their programs  Anti-smoking Gums are there in many medical shops. Try them to quit smoking   Side-effects of Smoking: Disease caused by smoking cigarettes are emphysema, bronchitis, heart failures  Premature death  Cancer is the major side effect of smoking  Heart attacks and strokes are the quick effects of smoking causing sudden death  Some smokers lives end up with limbs amputated  Breathing problem or fast breathing is another side effect of smoking  Due to more intakes of smokes, carbon mono-oxide goes into your brain and other muscles of the body which leads to swelling of the veins and blockage to the air passage to  lungs  Carbon monoxide blocks blood vessels which leads to blockage in the flow of blood to different major body organs like heart lungs and thus leads to attacks and deaths  During pregnancy smoking is very harmful and leads to premature birth of the infant, spontaneous abortions, low weight of the infant during birth  Fat depositions to narrow and blocked blood vessels causing heart attacks  In many cases cigarette smoking caused infertility in men

## 2012-03-19 DIAGNOSIS — Z0000 Encounter for general adult medical examination without abnormal findings: Secondary | ICD-10-CM | POA: Insufficient documentation

## 2012-03-19 LAB — COMPLETE METABOLIC PANEL WITH GFR
ALT: 12 U/L (ref 0–53)
Albumin: 4.1 g/dL (ref 3.5–5.2)
CO2: 28 mEq/L (ref 19–32)
Calcium: 9.3 mg/dL (ref 8.4–10.5)
Chloride: 103 mEq/L (ref 96–112)
GFR, Est African American: >89 mL/min (ref >89)
Potassium: 4.5 mEq/L (ref 3.5–5.3)
Sodium: 139 mEq/L (ref 135–145)
Total Protein: 6.9 g/dL (ref 6.0–8.3)

## 2012-03-19 LAB — LIPID PANEL
LDL Cholesterol: 116 mg/dL — ABNORMAL HIGH (ref 0–99)
Triglycerides: 273 mg/dL — ABNORMAL HIGH (ref <150)

## 2012-03-19 NOTE — Assessment & Plan Note (Signed)
Uncontrolled, non compliant with med, will prescribe janumet, needs to collect coupon

## 2012-03-19 NOTE — Progress Notes (Signed)
  Subjective:    Patient ID: Dakota Mendez, male    DOB: 1960/10/02, 51 y.o.   MRN: [id number]  HPI The PT is here for annual exam  and re-evaluation of chronic medical conditions, medication management and review of any available recent lab and radiology data.  Preventive health is updated, specifically  Cancer screening and Immunization.   Questions or concerns regarding consultations or procedures which the PT has had in the interim are  Addressed.Had neck surgery in January of this year, condition improved, but still experiencing pain The PT denies any adverse reactions to current medications since the last visit.  Not taking diabetes or cholesterol medication and out of blood pressure med, states he feels well C/o sexual dysfunction and wantsmed for this as needed Unwilling to comiting to quitting smoking , but willing to cut down      Review of Systems See HPI Denies recent fever or chills. Denies sinus pressure, nasal congestion, ear pain or sore throat. Denies chest congestion, productive cough or wheezing. Denies chest pains, palpitations and leg swelling Denies abdominal pain, nausea, vomiting,diarrhea or constipation.   Denies dysuria, frequency, hesitancy or incontinence. Denies headaches, seizures, numbness, or tingling. Denies depression, anxiety or insomnia. Denies skin break down or rash.        Objective:   Physical Exam  Pleasant well nourished male, alert and oriented x 3, in no cardio-pulmonary distress. Afebrile. HEENT No facial trauma or asymetry. Sinuses non tender. EOMI, PERTL, fundoscopic exam is negative for hemorhages or exudates. External ears normal, tympanic membranes clear. Oropharynx moist, no exudate, poor dentition. Neck: decreased ROM, no adenopathy,JVD or thyromegaly.No bruits.  Chest: Clear to ascultation bilaterally.No crackles or wheezes. Non tender to palpation Decreased air entry throughout  Breast: No asymetry,no masses. No  nipple discharge or inversion. No axillary or supraclavicular adenopathy  Cardiovascular system; Heart sounds normal,  S1 and  S2 ,no S3.  No murmur, or thrill. Apical beat not displaced Peripheral pulses normal.  Abdomen: Soft, non tender, no organomegaly or masses. No bruits.obese Bowel sounds normal. No guarding, tenderness or rebound.  Rectal:  No mass. guaiac negative stool. Prostate smooth and firm, no nodules, not enlarged   Musculoskeletal exam: Decreased  ROM of spine,adequate in  hips , shoulders and knees. No deformity ,swelling or crepitus noted. No muscle wasting or atrophy.   Neurologic: Cranial nerves 2 to 12 intact. Power, tone ,sensation and reflexes normal throughout. No disturbance in gait. No tremor.  Skin: Intact, no ulceration, erythema , scaling or rash noted.Onychomycosis of toenails Pigmentation normal throughout  Psych; Normal mood and affect. Judgement and concentration normal  Diabetic Foot Check:  Appearance - no lesions, ulcers or calluses Skin - no unusual pallor or redness, severe onychomycosis Sensation - grossly intact to light touch Monofilament testing -  Right - Great toe, medial, central, lateral ball and posterior foot intact Left - Great toe, medial, central, lateral ball and posterior foot intact Pulses Left - Dorsalis Pedis and Posterior Tibia normal Right - Dorsalis Pedis and Posterior Tibia normal   .        Assessment & Plan:

## 2012-03-19 NOTE — Assessment & Plan Note (Signed)
Uncontrolled due to non compliance , pt to resume meds

## 2012-03-19 NOTE — Assessment & Plan Note (Signed)
Smoking cessation counseling done for 5 minutes and printed info given.  The importance of regular physical activity and weight loss discussed.  The need to change to low carb diet , for  improved blood sugar control and weight loss dicussed,  Compliance with medications stressed

## 2012-03-19 NOTE — Assessment & Plan Note (Signed)
Hyperlipidemia:Low fat diet discussed and encouraged.  Needs to resume med, pt to be notified lab available after visit, will prescribe pravastatin

## 2012-03-20 ENCOUNTER — Other Ambulatory Visit: Payer: Self-pay

## 2012-03-20 ENCOUNTER — Telehealth: Payer: Self-pay | Admitting: Family Medicine

## 2012-03-20 DIAGNOSIS — E785 Hyperlipidemia, unspecified: Secondary | ICD-10-CM

## 2012-03-20 DIAGNOSIS — E119 Type 2 diabetes mellitus without complications: Secondary | ICD-10-CM

## 2012-03-20 MED ORDER — PRAVASTATIN SODIUM 20 MG PO TABS: 20.0000 mg | ORAL_TABLET | Freq: Every evening | ORAL | Status: DC

## 2012-03-20 MED ORDER — SITAGLIPTIN PHOS-METFORMIN HCL 50-500 MG PO TABS: 1.0000 | ORAL_TABLET | Freq: Two times a day (BID) | ORAL | Status: DC

## 2012-03-20 NOTE — Telephone Encounter (Signed)
Patient aware.

## 2012-03-27 ENCOUNTER — Telehealth: Payer: Self-pay | Admitting: Family Medicine

## 2012-03-27 ENCOUNTER — Other Ambulatory Visit: Payer: Self-pay | Admitting: Family Medicine

## 2012-03-28 NOTE — Telephone Encounter (Signed)
Only works for one free trial.

## 2012-03-29 NOTE — Telephone Encounter (Signed)
Called and left voice mail for pt

## 2012-04-29 ENCOUNTER — Ambulatory Visit: Payer: Medicare Other | Admitting: Family Medicine

## 2012-05-10 ENCOUNTER — Ambulatory Visit (INDEPENDENT_AMBULATORY_CARE_PROVIDER_SITE_OTHER): Payer: Medicare Other | Admitting: Family Medicine

## 2012-05-10 ENCOUNTER — Encounter: Payer: Self-pay | Admitting: Family Medicine

## 2012-05-10 VITALS — BP 142/98 | HR 120 | Resp 16 | Ht 72.0 in | Wt 311.4 lb

## 2012-05-10 DIAGNOSIS — F17200 Nicotine dependence, unspecified, uncomplicated: Secondary | ICD-10-CM

## 2012-05-10 DIAGNOSIS — M899 Disorder of bone, unspecified: Secondary | ICD-10-CM

## 2012-05-10 DIAGNOSIS — R5383 Other fatigue: Secondary | ICD-10-CM

## 2012-05-10 DIAGNOSIS — R5381 Other malaise: Secondary | ICD-10-CM

## 2012-05-10 DIAGNOSIS — E785 Hyperlipidemia, unspecified: Secondary | ICD-10-CM

## 2012-05-10 DIAGNOSIS — I10 Essential (primary) hypertension: Secondary | ICD-10-CM

## 2012-05-10 DIAGNOSIS — Z125 Encounter for screening for malignant neoplasm of prostate: Secondary | ICD-10-CM

## 2012-05-10 DIAGNOSIS — E119 Type 2 diabetes mellitus without complications: Secondary | ICD-10-CM

## 2012-05-10 DIAGNOSIS — E669 Obesity, unspecified: Secondary | ICD-10-CM

## 2012-05-10 MED ORDER — BENAZEPRIL HCL 40 MG PO TABS: 40.0000 mg | ORAL_TABLET | Freq: Every day | ORAL | Status: DC

## 2012-05-10 NOTE — Patient Instructions (Addendum)
Annual wellness in November  Fasting cbc, lipid, cmp, psa, tsh and vit D and microalb in November  It is important that you exercise regularly at least 30 minutes 5 times a week. If you develop chest pain, have severe difficulty breathing, or feel very tired, stop exercising immediately and seek medical attention   A healthy diet is rich in fruit, vegetables and whole grains. Poultry fish, nuts and beans are a healthy choice for protein rather then red meat. A low sodium diet and drinking 64 ounces of water daily is generally recommended. Oils and sweet should be limited. Carbohydrates especially for those who are diabetic or overweight, should be limited to 30-45 gram per meal. It is important to eat on a regular schedule, at least 3 times daily. Snacks should be primarily fruits, vegetables or nuts.   Weight loss goal of 4 pounds per month  Please take benazepril as prescribed, blood pressure is high, continue amlodipine

## 2012-05-12 NOTE — Progress Notes (Signed)
  Subjective:    Patient ID: Dakota Mendez, male    DOB: [birth year], 51 y.o.   MRN: [id number]  HPI The PT is here for follow up and re-evaluation of chronic medical conditions,most specifically uncontrolled hypertension, obesity and nicotine use, medication management and review of any available recent lab and radiology data.  Preventive health is updated, specifically  Cancer screening and Immunization.   Questions or concerns regarding consultations or procedures which the PT has had in the interim are  addressed. The PT denies any adverse reactions to current medications since the last visit.  There are no new concerns.  There are no specific complaints , he is however unhappy about his lack of commitment to regular physical activity and nicotine use. Does not tesd sugars regularly and denies symptoms of hypo or hyperglycemia      Review of Systems See HPI Denies recent fever or chills. Denies sinus pressure, nasal congestion, ear pain or sore throat. Denies chest congestion, productive cough or wheezing. Denies chest pains, palpitations and leg swelling Denies abdominal pain, nausea, vomiting,diarrhea or constipation.   Denies dysuria, frequency, hesitancy or incontinence. Denies joint pain, swelling and limitation in mobility. Denies headaches, seizures, numbness, or tingling. Denies depression, anxiety or insomnia. Denies skin break down or rash.        Objective:   Physical Exam  Patient alert and oriented and in no cardiopulmonary distress.  HEENT: No facial asymmetry, EOMI, no sinus tenderness,  oropharynx pink and moist.  Neck supple no adenopathy.  Chest: Clear to auscultation bilaterally.  CVS: S1, S2 no murmurs, no S3.  ABD: Soft non tender. Bowel sounds normal.  Ext: No edema  MS: Adequate ROM spine, shoulders, hips and knees.  Skin: Intact, no ulcerations or rash noted.  Psych: Good eye contact, normal affect. Memory intact not anxious or depressed  appearing.  CNS: CN 2-12 intact, power, tone and sensation normal throughout.       Assessment & Plan:

## 2012-05-12 NOTE — Assessment & Plan Note (Signed)
Hyperlipidemia:Low fat diet discussed and encouraged.  Updated labs prior to next visit 

## 2012-05-12 NOTE — Assessment & Plan Note (Signed)
Unchanged. Patient counseled for approximately 5 minutes regarding the health risks of ongoing nicotine use, specifically all types of cancer, heart disease, stroke and respiratory failure. The options available for help with cessation ,the behavioral changes to assist the process, and the option to either gradully reduce usage  Or abruptly stop.is also discussed. Pt is also encouraged to set specific goals in number of cigarettes used daily, as well as to set a quit date.  

## 2012-05-12 NOTE — Assessment & Plan Note (Signed)
Unchanged. Patient re-educated about  the importance of commitment to a  minimum of 150 minutes of exercise per week. The importance of healthy food choices with portion control discussed. Encouraged to start a food diary, count calories and to consider  joining a support group. Sample diet sheets offered. Goals set by the patient for the next several months.    

## 2012-05-12 NOTE — Assessment & Plan Note (Signed)
Sub optimal control, emphasized the importance of dietary change and regular physical activity, as well as adherence to medication

## 2012-05-12 NOTE — Assessment & Plan Note (Signed)
Uncontrolled, pt non compliant with med regime, will fill prescribed drugs and take .  DASH diet and commitment to daily physical activity for a minimum of 30 minutes discussed and encouraged, as a part of hypertension management. The importance of attaining a healthy weight is also discussed.

## 2012-06-13 ENCOUNTER — Other Ambulatory Visit: Payer: Self-pay | Admitting: Family Medicine

## 2012-06-13 ENCOUNTER — Telehealth: Payer: Self-pay | Admitting: Family Medicine

## 2012-06-13 ENCOUNTER — Other Ambulatory Visit: Payer: Self-pay

## 2012-06-13 MED ORDER — GLIPIZIDE ER 5 MG PO TB24: 5.0000 mg | ORAL_TABLET | Freq: Every day | ORAL | Status: DC

## 2012-06-13 MED ORDER — METFORMIN HCL 500 MG PO TABS: 500.0000 mg | ORAL_TABLET | Freq: Two times a day (BID) | ORAL | Status: DC

## 2012-06-13 NOTE — Telephone Encounter (Signed)
He has medicare, coupons won't work. Call for samples??

## 2012-06-13 NOTE — Telephone Encounter (Signed)
Patient aware and meds sent 

## 2012-06-13 NOTE — Telephone Encounter (Signed)
Janumet is to be stopped, and i have entered metformin and glipizide historically in its place, please send in after you speak with him

## 2012-06-24 ENCOUNTER — Encounter: Payer: Self-pay | Admitting: Family Medicine

## 2012-07-16 ENCOUNTER — Encounter: Payer: Medicare Other | Admitting: Family Medicine

## 2012-12-17 ENCOUNTER — Other Ambulatory Visit: Payer: Self-pay | Admitting: Family

## 2012-12-17 ENCOUNTER — Other Ambulatory Visit: Payer: Self-pay | Admitting: Family Medicine

## 2012-12-18 ENCOUNTER — Encounter: Payer: Self-pay | Admitting: Family Medicine

## 2012-12-18 ENCOUNTER — Ambulatory Visit (INDEPENDENT_AMBULATORY_CARE_PROVIDER_SITE_OTHER): Payer: Medicare Other | Admitting: Family Medicine

## 2012-12-18 VITALS — BP 180/120 | HR 110 | Resp 16 | Ht 72.0 in | Wt 304.0 lb

## 2012-12-18 DIAGNOSIS — F17200 Nicotine dependence, unspecified, uncomplicated: Secondary | ICD-10-CM

## 2012-12-18 DIAGNOSIS — N529 Male erectile dysfunction, unspecified: Secondary | ICD-10-CM

## 2012-12-18 DIAGNOSIS — E119 Type 2 diabetes mellitus without complications: Secondary | ICD-10-CM

## 2012-12-18 DIAGNOSIS — E785 Hyperlipidemia, unspecified: Secondary | ICD-10-CM

## 2012-12-18 DIAGNOSIS — I10 Essential (primary) hypertension: Secondary | ICD-10-CM

## 2012-12-18 DIAGNOSIS — Z91199 Patient's noncompliance with other medical treatment and regimen due to unspecified reason: Secondary | ICD-10-CM

## 2012-12-18 DIAGNOSIS — Z9119 Patient's noncompliance with other medical treatment and regimen: Secondary | ICD-10-CM

## 2012-12-18 DIAGNOSIS — E669 Obesity, unspecified: Secondary | ICD-10-CM

## 2012-12-18 DIAGNOSIS — R9431 Abnormal electrocardiogram [ECG] [EKG]: Secondary | ICD-10-CM

## 2012-12-18 DIAGNOSIS — B351 Tinea unguium: Secondary | ICD-10-CM

## 2012-12-18 LAB — CBC WITH DIFFERENTIAL/PLATELET
Basophils Absolute: 0 10*3/uL (ref 0.0–0.1)
Basophils Relative: 0 % (ref 0–1)
Eosinophils Relative: 2 % (ref 0–5)
HCT: 47.5 % (ref 39.0–52.0)
Hemoglobin: 15.6 g/dL (ref 13.0–17.0)
Lymphocytes Relative: 35 % (ref 12–46)
MCHC: 32.8 g/dL (ref 30.0–36.0)
MCV: 85.6 fL (ref 78.0–100.0)
Monocytes Absolute: 0.7 10*3/uL (ref 0.1–1.0)
Monocytes Relative: 6 % (ref 3–12)
Neutro Abs: 6.9 10*3/uL (ref 1.7–7.7)
RDW: 14.5 % (ref 11.5–15.5)

## 2012-12-18 LAB — COMPREHENSIVE METABOLIC PANEL
Albumin: 3.8 g/dL (ref 3.5–5.2)
BUN: 16 mg/dL (ref 6–23)
Calcium: 9.6 mg/dL (ref 8.4–10.5)
Chloride: 101 mEq/L (ref 96–112)
Creat: 1.03 mg/dL (ref 0.50–1.35)
Glucose, Bld: 312 mg/dL — ABNORMAL HIGH (ref 70–99)
Potassium: 4.7 mEq/L (ref 3.5–5.3)

## 2012-12-18 LAB — LIPID PANEL
HDL: 41 mg/dL (ref >39)
Total CHOL/HDL Ratio: 5.1 Ratio
Triglycerides: 318 mg/dL — ABNORMAL HIGH (ref <150)

## 2012-12-18 LAB — MICROALBUMIN / CREATININE URINE RATIO: Microalb Creat Ratio: 35 mg/g — ABNORMAL HIGH (ref 0.0–30.0)

## 2012-12-18 MED ORDER — PRAVASTATIN SODIUM 20 MG PO TABS: 20.0000 mg | ORAL_TABLET | Freq: Every day | ORAL | Status: DC

## 2012-12-18 MED ORDER — LOSARTAN POTASSIUM-HCTZ 100-25 MG PO TABS: 1.0000 | ORAL_TABLET | Freq: Every day | ORAL | Status: DC

## 2012-12-18 MED ORDER — TADALAFIL 20 MG PO TABS: 20.0000 mg | ORAL_TABLET | Freq: Every day | ORAL | Status: DC | PRN

## 2012-12-18 MED ORDER — AMLODIPINE BESYLATE 5 MG PO TABS: 5.0000 mg | ORAL_TABLET | Freq: Every day | ORAL | Status: DC

## 2012-12-18 MED ORDER — METFORMIN HCL 1000 MG PO TABS: 1000.0000 mg | ORAL_TABLET | Freq: Two times a day (BID) | ORAL | Status: DC

## 2012-12-18 MED ORDER — TERBINAFINE HCL 250 MG PO TABS: 250.0000 mg | ORAL_TABLET | Freq: Every day | ORAL | Status: AC

## 2012-12-18 NOTE — Progress Notes (Signed)
  Subjective:    Patient ID: Dakota Mendez, male    DOB: 04/05/1961, 52 y.o.   MRN: [id number]  HPI The PT is here for follow up and re-evaluation of chronic medical conditions, medication management and review of any available recent lab and radiology data.  Preventive health is updated, specifically  Cancer screening and Immunization.   . The PT denies any adverse reactions to current medications since the last visit.  There are no new concerns.  There are no specific complaints , states he feels well, has not been taking any medication, feels does not need them and in denial of his chronic diseases which are severe Denies polyuria, polydipsia, blurred vision or dry mouth, not testing blood sugars      Review of Systems See HPI Denies recent fever or chills. Denies sinus pressure, nasal congestion, ear pain or sore throat. Denies chest congestion, productive cough or wheezing. Denies chest pains, palpitations and leg swelling Denies abdominal pain, nausea, vomiting,diarrhea or constipation.   Denies dysuria, frequency, hesitancy or incontinence.c/o ED Denies joint pain, swelling and limitation in mobility. Denies headaches, seizures, numbness, or tingling. Denies depression, anxiety or insomnia. Denies skin break down or rash.        Objective:   Physical Exam  Patient alert and oriented and in no cardiopulmonary distress.  HEENT: No facial asymmetry, EOMI, no sinus tenderness,  oropharynx pink and moist.  Neck supple no adenopathy.  Chest: Clear to auscultation bilaterally.  CVS: S1, S2 no murmurs, no S3.  ABD: Soft non tender. Bowel sounds normal.  Ext: No edema  MS: Adequate ROM spine, shoulders, hips and knees.  Skin: Intact, no ulcerations or rash noted.  Psych: Good eye contact, normal affect. Memory intact not anxious or depressed appearing.  CNS: CN 2-12 intact, power, tone and sensation normal throughout.       Assessment & Plan:

## 2012-12-18 NOTE — Patient Instructions (Addendum)
F/u in 6 weeks  You need to take 2 meds for high bloood pressure, losartan and amlodipine  Metformin for diabetes, likely another one will be added  Pravastatin for cholesterol  Aspirin 81mg  once daily to reduce risk of heart attack  Terbinafine daily for fungal toenail infection.  Cialis maximum twice weekly three days apart , as needed, for erections  You need to set a quit date and stop smoking, you will get info as to classes available through the health dept to help with support and medication for this, I encourage you to go  Please attend a free diabetic class at hospital to learn hiow to eat, how much, how often, andf what foods , to improve and control your diabetes  You CAN, you NEED TO  and WILL do better!!!

## 2012-12-18 NOTE — Assessment & Plan Note (Signed)

## 2012-12-19 LAB — COMPLETE METABOLIC PANEL WITH GFR
Albumin: 3.8 g/dL (ref 3.5–5.2)
Alkaline Phosphatase: 92 U/L (ref 39–117)
CO2: 25 mEq/L (ref 19–32)
Calcium: 9.4 mg/dL (ref 8.4–10.5)
Chloride: 101 mEq/L (ref 96–112)
GFR, Est Non African American: 80 mL/min
Glucose, Bld: 298 mg/dL — ABNORMAL HIGH (ref 70–99)
Potassium: 4.6 mEq/L (ref 3.5–5.3)
Sodium: 137 mEq/L (ref 135–145)
Total Protein: 6.7 g/dL (ref 6.0–8.3)

## 2012-12-20 ENCOUNTER — Other Ambulatory Visit: Payer: Self-pay

## 2012-12-20 DIAGNOSIS — E119 Type 2 diabetes mellitus without complications: Secondary | ICD-10-CM

## 2012-12-20 MED ORDER — GLIPIZIDE ER 10 MG PO TB24: ORAL_TABLET | ORAL | Status: DC

## 2012-12-21 DIAGNOSIS — Z9119 Patient's noncompliance with other medical treatment and regimen: Secondary | ICD-10-CM | POA: Insufficient documentation

## 2012-12-21 NOTE — Assessment & Plan Note (Signed)
Uncontrolled. Hyperlipidemia:Low fat diet discussed and encouraged.  Pt to start meds

## 2012-12-21 NOTE — Assessment & Plan Note (Signed)
Uncontrolled due to med and dietary non compliance, pt in denial, he is to go to class and take meds, wife will assist Patient advised to reduce carb and sweets, commit to regular physical activity, take meds as prescribed, test blood as directed, and attempt to lose weight, to improve blood sugar control.

## 2012-12-21 NOTE — Assessment & Plan Note (Signed)
Uncontrolled due to non compliance DASH diet and commitment to daily physical activity for a minimum of 30 minutes discussed and encouraged, as a part of hypertension management. The importance of attaining a healthy weight is also discussed. Pt to resume meds, high stroke risk has po family hx this is stressed

## 2012-12-21 NOTE — Assessment & Plan Note (Signed)
Basic ekg in office today negative for ischemia and pt on no nitrates, script provided

## 2012-12-21 NOTE — Assessment & Plan Note (Signed)
Past history with multiple uncontrolled medical illnesses which place him at high risk. rept EKG in office today, no significant abnormalities noted

## 2012-12-21 NOTE — Assessment & Plan Note (Signed)
Repeated behavior of medical non compliance. Pt again re educated re the danger of same. States he will chan behavior

## 2012-12-21 NOTE — Assessment & Plan Note (Signed)
Unchanged. Patient re-educated about  the importance of commitment to a  minimum of 150 minutes of exercise per week. The importance of healthy food choices with portion control discussed. Encouraged to start a food diary, count calories and to consider  joining a support group. Sample diet sheets offered. Goals set by the patient for the next several months.    

## 2012-12-23 ENCOUNTER — Telehealth: Payer: Self-pay | Admitting: Family Medicine

## 2012-12-23 MED ORDER — ONETOUCH DELICA LANCETS 33G MISC: 1.0000 | Freq: Every day | Status: DC

## 2012-12-23 MED ORDER — GLUCOSE BLOOD VI STRP: ORAL_STRIP | Status: DC

## 2012-12-23 NOTE — Telephone Encounter (Signed)
Sent to [facility]

## 2013-01-06 ENCOUNTER — Encounter: Payer: Self-pay | Admitting: Family Medicine

## 2013-01-08 ENCOUNTER — Telehealth: Payer: Self-pay

## 2013-01-08 NOTE — Telephone Encounter (Signed)
Patient aware- will mail some dm meal information.

## 2013-01-08 NOTE — Telephone Encounter (Signed)
Patient states his fasting sugar has been running from in the 80's to 140 and today after he ate breakfast he laid down and when he got up he felt a little dizzy. His sugar was 48 and he ate some candy and it went up to 72. Feels fine now but thinks he's taking too much DM meds. SHould he stop one or reduce dose?

## 2013-01-08 NOTE — Telephone Encounter (Signed)
Fasting sugar result is good, 48 is low, based on last hBA1C doubt he is on too high a dose. He needs to focus on eating same amt of carbs per meal regularly, can check sugars up to0 twice daily, Call back if keeps having sugar lows

## 2013-01-16 ENCOUNTER — Telehealth: Payer: Self-pay | Admitting: Family Medicine

## 2013-01-17 NOTE — Telephone Encounter (Signed)
Called to offer pt another meter with 10 strips that he can use until he can get his filled again. Medicare will only pay for him to test once daily. We have no extra strips

## 2013-01-17 NOTE — Telephone Encounter (Signed)
Patient aware.

## 2013-01-22 ENCOUNTER — Ambulatory Visit (INDEPENDENT_AMBULATORY_CARE_PROVIDER_SITE_OTHER): Payer: Medicare Other | Admitting: Family Medicine

## 2013-01-22 ENCOUNTER — Encounter: Payer: Self-pay | Admitting: Family Medicine

## 2013-01-22 VITALS — BP 142/100 | HR 112 | Resp 16 | Ht 72.0 in | Wt 305.0 lb

## 2013-01-22 DIAGNOSIS — IMO0002 Reserved for concepts with insufficient information to code with codable children: Secondary | ICD-10-CM

## 2013-01-22 DIAGNOSIS — E785 Hyperlipidemia, unspecified: Secondary | ICD-10-CM

## 2013-01-22 DIAGNOSIS — E1165 Type 2 diabetes mellitus with hyperglycemia: Secondary | ICD-10-CM

## 2013-01-22 DIAGNOSIS — I10 Essential (primary) hypertension: Secondary | ICD-10-CM

## 2013-01-22 DIAGNOSIS — Z9119 Patient's noncompliance with other medical treatment and regimen: Secondary | ICD-10-CM

## 2013-01-22 DIAGNOSIS — Z91199 Patient's noncompliance with other medical treatment and regimen due to unspecified reason: Secondary | ICD-10-CM

## 2013-01-22 DIAGNOSIS — E669 Obesity, unspecified: Secondary | ICD-10-CM

## 2013-01-22 MED ORDER — AMLODIPINE BESYLATE 10 MG PO TABS: 10.0000 mg | ORAL_TABLET | Freq: Every day | ORAL | Status: DC

## 2013-01-22 NOTE — Patient Instructions (Addendum)
F/u July 10 or after.  Non fasting cmp and EGFR, hBA1C July 8 or after but bEFORE visit  Bring all meds to next appt please  Blood pressure too high, increase amlodipine to 10mg  one daily, OK to take two 5mg  tabs daily til done  Walk 2 miles every day.  You have lost 13 pounds,CONGRATS! test blood sugar once  daily  Use fungal toenail tabs every day

## 2013-01-22 NOTE — Progress Notes (Signed)
  Subjective:    Patient ID: Dakota Mendez, male    DOB: 1961-09-07, 52 y.o.   MRN: [id number]  HPI The PT is here for follow up and re-evaluation of chronic medical conditions, medication management and review of any available recent lab and radiology data.  Preventive health is updated, specifically  Cancer screening and Immunization.   Questions or concerns regarding consultations or procedures which the PT has had in the interim are  addressed. The PT denies any adverse reactions to current medications since the last visit.  All chronic disease is uncontrolled, however making an effort to address his  Is changing lifestyle with success, diabetic log much improved, weight has been lost     Review of Systems See HPI Denies recent fever or chills. Denies sinus pressure, nasal congestion, ear pain or sore throat. Denies chest congestion, productive cough or wheezing. Denies chest pains, palpitations and leg swelling Denies abdominal pain, nausea, vomiting,diarrhea or constipation.   Denies dysuria, frequency, hesitancy or incontinence. Denies joint pain, swelling and limitation in mobility. Denies headaches, seizures, numbness, or tingling. Denies depression, anxiety or insomnia. Denies skin break down or rash.        Objective:   Physical Exam Patient alert and oriented and in no cardiopulmonary distress.  HEENT: No facial asymmetry, EOMI, no sinus tenderness,  oropharynx pink and moist.  Neck supple no adenopathy.  Chest: Clear to auscultation bilaterally.  CVS: S1, S2 no murmurs, no S3.  ABD: Soft non tender. Bowel sounds normal.  Ext: No edema  MS: Adequate ROM spine, shoulders, hips and knees.  Skin: Intact, no ulcerations or rash noted.  Psych: Good eye contact, normal affect. Memory intact not anxious or depressed appearing.  CNS: CN 2-12 intact, power, tone and sensation normal throughout.        Assessment & Plan:

## 2013-02-20 NOTE — Assessment & Plan Note (Signed)
Improved. Pt applauded on succesful weight loss through lifestyle change, and encouraged to continue same. Weight loss goal set for the next several months.  

## 2013-02-20 NOTE — Assessment & Plan Note (Signed)
Some improvement at this visit, hopefully this will continue

## 2013-02-20 NOTE — Assessment & Plan Note (Signed)
Improved based on diary presented. Patient advised to reduce carb and sweets, commit to regular physical activity, take meds as prescribed, test blood as directed, and attempt to lose weight, to improve blood sugar control. Updated lab next visit

## 2013-02-20 NOTE — Assessment & Plan Note (Signed)
Uncontrolled Increase in med dose DASH diet and commitment to daily physical activity for a minimum of 30 minutes discussed and encouraged, as a part of hypertension management. The importance of attaining a healthy weight is also discussed.

## 2013-02-20 NOTE — Assessment & Plan Note (Signed)
Hyperlipidemia:Low fat diet discussed and encouraged.  Updated lab next visit 

## 2013-03-24 ENCOUNTER — Ambulatory Visit: Payer: Medicare Other | Admitting: Family Medicine

## 2013-04-10 ENCOUNTER — Ambulatory Visit: Payer: Medicare Other | Admitting: Family Medicine

## 2013-04-17 LAB — HEMOGLOBIN A1C: Hgb A1c MFr Bld: 7.3 % — ABNORMAL HIGH (ref <5.7)

## 2013-04-17 LAB — COMPLETE METABOLIC PANEL WITH GFR
Alkaline Phosphatase: 76 U/L (ref 39–117)
BUN: 15 mg/dL (ref 6–23)
GFR, Est Non African American: 72 mL/min
Glucose, Bld: 153 mg/dL — ABNORMAL HIGH (ref 70–99)
Sodium: 137 mEq/L (ref 135–145)
Total Bilirubin: 0.6 mg/dL (ref 0.3–1.2)
Total Protein: 6.9 g/dL (ref 6.0–8.3)

## 2013-04-18 ENCOUNTER — Ambulatory Visit (INDEPENDENT_AMBULATORY_CARE_PROVIDER_SITE_OTHER): Payer: Medicare Other | Admitting: Family Medicine

## 2013-04-18 ENCOUNTER — Encounter: Payer: Self-pay | Admitting: Family Medicine

## 2013-04-18 VITALS — BP 140/94 | HR 98 | Resp 16 | Wt 311.8 lb

## 2013-04-18 DIAGNOSIS — IMO0002 Reserved for concepts with insufficient information to code with codable children: Secondary | ICD-10-CM

## 2013-04-18 DIAGNOSIS — G4733 Obstructive sleep apnea (adult) (pediatric): Secondary | ICD-10-CM | POA: Insufficient documentation

## 2013-04-18 DIAGNOSIS — I10 Essential (primary) hypertension: Secondary | ICD-10-CM

## 2013-04-18 DIAGNOSIS — E1165 Type 2 diabetes mellitus with hyperglycemia: Secondary | ICD-10-CM

## 2013-04-18 DIAGNOSIS — E785 Hyperlipidemia, unspecified: Secondary | ICD-10-CM

## 2013-04-18 DIAGNOSIS — E669 Obesity, unspecified: Secondary | ICD-10-CM

## 2013-04-18 DIAGNOSIS — N529 Male erectile dysfunction, unspecified: Secondary | ICD-10-CM

## 2013-04-18 DIAGNOSIS — G4730 Sleep apnea, unspecified: Secondary | ICD-10-CM

## 2013-04-18 DIAGNOSIS — Z1211 Encounter for screening for malignant neoplasm of colon: Secondary | ICD-10-CM

## 2013-04-18 DIAGNOSIS — F17200 Nicotine dependence, unspecified, uncomplicated: Secondary | ICD-10-CM

## 2013-04-18 MED ORDER — GLIPIZIDE 5 MG PO TABS: ORAL_TABLET | ORAL | Status: DC

## 2013-04-18 MED ORDER — TADALAFIL 20 MG PO TABS: 20.0000 mg | ORAL_TABLET | Freq: Every day | ORAL | Status: DC | PRN

## 2013-04-18 NOTE — Progress Notes (Signed)
  Subjective:    Patient ID: Shiela Mayer, male    DOB: 1960/09/27, 52 y.o.   MRN: [id number]  HPI The PT is here for follow up and re-evaluation of chronic medical conditions, medication management and review of any available recent lab and radiology data.  Preventive health is updated, specifically  Cancer screening and Immunization.  Needs colonoscopy  Reports intermittent "low sugar " episodes, has stopped glipizide for 1 month, and has also stopped testing Stopped testing regularly as he was initially Cialis expensive but will stick with it, needs med for sexual function H/o intermittent snoring, habitus highly suggestive of sleep apnea, will refer for eval No commitment to regular exercise      Review of Systems  See HPI Denies recent fever or chills. Denies sinus pressure, nasal congestion, ear pain or sore throat. Denies chest congestion, productive cough or wheezing. Denies chest pains, palpitations and leg swelling Denies abdominal pain, nausea, vomiting,diarrhea or constipation.   Denies dysuria, frequency, hesitancy or incontinence. Denies joint pain, swelling and limitation in mobility. Denies headaches, seizures, numbness, or tingling. Denies depression, anxiety or insomnia. Denies skin break down or rash.        Objective:   Physical Exam  Patient alert and oriented and in no cardiopulmonary distress.  HEENT: No facial asymmetry, EOMI, no sinus tenderness,  oropharynx pink and moist.  Neck supple no adenopathy.Neck large, airway crowded  Chest: Clear to auscultation bilaterally.Decreased air entry.  CVS: S1, S2 no murmurs, no S3.  ABD: Soft non tender. Bowel sounds normal.  Ext: No edema  MS: Adequate ROM spine, shoulders, hips and knees.  Skin: Intact, no ulcerations or rash noted.  Psych: Good eye contact, normal affect. Memory intact not anxious or depressed appearing.  CNS: CN 2-12 intact, power, tone and sensation normal throughout.       Assessment & Plan:

## 2013-04-18 NOTE — Patient Instructions (Addendum)
F/u in  3 month, call if you need me before  You need to stop smoking   Blood sugar improved, pls start glipizide 5mg  two in the morning and one in the evening. Test blood sugar every morning.  If blood sugar is too low, less than 80, then reduce to one 5mg  tablet twice daily Call with questions Goal for fasting blood sugar ranges from 80 to 120 and 2 hours after any meal or at bedtime should be between 130 to 170.   You are referred  too Dr Karilyn Cota for colonoscopy.  You are referred to Dr Maple Hudson for evaluation of sleep apnea  Need to commit daily physical activity for at least 30 minutes every day

## 2013-04-20 NOTE — Assessment & Plan Note (Signed)
Sub optimal control.. No med change, lifestyle change only at this time with weight loss. DASH diet and commitment to daily physical activity for a minimum of 30 minutes discussed and encouraged, as a part of hypertension management. The importance of attaining a healthy weight is also discussed.

## 2013-04-20 NOTE — Assessment & Plan Note (Signed)
cialis effective but expensive, no interest in pump or injections at this time, offered as more affordable alternatives

## 2013-04-20 NOTE — Assessment & Plan Note (Signed)
H/o intermittent snoring , thicjk neck, and upper airway obstruction, testing for sleep apnea I believe will be beneficial Will refer to pulmonary for further eval

## 2013-04-20 NOTE — Assessment & Plan Note (Signed)
Marked improvement, has not been taking the glipizide for 1 month, and stopped testing. He is advised to resume at lower dose of glipize, reports low blood sugars intermittently on prescribed dose, which i can believe. He is to call with concerns Needs to start walking daily and work on diet and weight loss

## 2013-04-20 NOTE — Assessment & Plan Note (Signed)
Hyperlipidemia:Low fat diet discussed and encouraged.  Updated lab next visit 

## 2013-04-20 NOTE — Assessment & Plan Note (Signed)
Unchanged. Patient counseled for approximately 5 minutes regarding the health risks of ongoing nicotine use, specifically all types of cancer, heart disease, stroke and respiratory failure. The options available for help with cessation ,the behavioral changes to assist the process, and the option to either gradully reduce usage  Or abruptly stop.is also discussed. Pt is also encouraged to set specific goals in number of cigarettes used daily, as well as to set a quit date.  

## 2013-04-20 NOTE — Assessment & Plan Note (Signed)
Deteriorated. Patient re-educated about  the importance of commitment to a  minimum of 150 minutes of exercise per week. The importance of healthy food choices with portion control discussed. Encouraged to start a food diary, count calories and to consider  joining a support group. Sample diet sheets offered. Goals set by the patient for the next several months.    

## 2013-04-21 ENCOUNTER — Encounter (INDEPENDENT_AMBULATORY_CARE_PROVIDER_SITE_OTHER): Payer: Self-pay | Admitting: *Deleted

## 2013-06-05 ENCOUNTER — Institutional Professional Consult (permissible substitution): Payer: Medicare Other | Admitting: Internal Medicine

## 2013-06-13 ENCOUNTER — Ambulatory Visit (INDEPENDENT_AMBULATORY_CARE_PROVIDER_SITE_OTHER): Payer: Medicare Other | Admitting: Internal Medicine

## 2013-06-13 ENCOUNTER — Encounter: Payer: Self-pay | Admitting: Internal Medicine

## 2013-06-13 VITALS — BP 136/80 | HR 110 | Ht 72.0 in | Wt 317.2 lb

## 2013-06-13 DIAGNOSIS — Z23 Encounter for immunization: Secondary | ICD-10-CM

## 2013-06-13 DIAGNOSIS — F17200 Nicotine dependence, unspecified, uncomplicated: Secondary | ICD-10-CM

## 2013-06-13 DIAGNOSIS — G4730 Sleep apnea, unspecified: Secondary | ICD-10-CM

## 2013-06-13 DIAGNOSIS — G4733 Obstructive sleep apnea (adult) (pediatric): Secondary | ICD-10-CM

## 2013-06-13 NOTE — Assessment & Plan Note (Signed)
Body habitus favors OSA and untreated OSA would significantly complicate cardiovascular risks of his HBP, DM and smoking We discussed physiology and medical issues. Plan- schedule NPSG  Can also give flu vax while here, as requested

## 2013-06-13 NOTE — Progress Notes (Signed)
06/13/13- 52 yoM smoker (1ppd) Dr Claris Che Simpson-no sleep study; wakes himself up snoring if really tired.- Wife here They admit snoring, worst on back, so he prefers side sleeping. Told O2 dropped when sedated after C-spine surgery. Not aware of daytime sleepiness. HS 10-11PM with no sleep med. Latency 10-15 minutes, waking twice before up 6:30 AM. No ENT surgery. Not aware of family hx OSA. Disabled truck driver. Medical hx HBP, DMII, obesity. Asks flu vax.  Discussed smoking.  Prior to Admission medications   Medication Sig Start Date End Date Taking? Authorizing Provider  amLODipine (NORVASC) 10 MG tablet Take 1 tablet (10 mg total) by mouth daily. 01/22/13 01/22/14 Yes Kerri Perches, MD  aspirin 81 MG tablet Take 81 mg by mouth daily.   Yes Historical Provider, MD  glipiZIDE (GLUCOTROL) 5 MG tablet Two tablets in the morning with breakfast, and one tablet in the evening with supprer 04/18/13 08/18/13 Yes Kerri Perches, MD  glucose blood (ONE TOUCH ULTRA TEST) test strip Use as instructed for once daily testing  Dx 250.00 length of need- 99 months 12/23/12  Yes Kerri Perches, MD  losartan-hydrochlorothiazide (HYZAAR) 100-25 MG per tablet Take 1 tablet by mouth daily. 12/18/12 12/18/13 Yes Kerri Perches, MD  metFORMIN (GLUCOPHAGE) 1000 MG tablet Take 1 tablet (1,000 mg total) by mouth 2 (two) times daily with a meal. 12/18/12 12/18/13 Yes Kerri Perches, MD  [facility] DELICA LANCETS 33G MISC 1 each by Does not apply route daily. For once daily testing DX 250.00 Length of need- 99 months 12/23/12  Yes Kerri Perches, MD  pravastatin (PRAVACHOL) 20 MG tablet Take 1 tablet (20 mg total) by mouth daily. 12/18/12 12/18/13 Yes Kerri Perches, MD  tadalafil (CIALIS) 20 MG tablet Take 1 tablet (20 mg total) by mouth daily as needed for erectile dysfunction. 04/18/13 08/18/13 Yes Kerri Perches, MD  terbinafine (LAMISIL) 250 MG tablet Take 250 mg by mouth daily.   Yes Historical  Provider, MD   Past Medical History  Diagnosis Date  . Tobacco abuse   . Chronic neck pain   . Chronic back pain   . Hyperlipidemia   . Obesity   . Hypertension   . Diabetes mellitus    Past Surgical History  Procedure Laterality Date  . Spine surgery  09/21/2011    cervial spine   Family History  Problem Relation Age of Onset  . Cirrhosis Father   . Heart attack Mother   . Depression Mother   . Diabetes Sister     oldest  . Hearing loss Brother     impared  . Stroke Mother    History   Social History  . Marital Status: Married    Spouse Name: N/A    Number of Children: 4  . Years of Education: N/A   Occupational History  . disabled-truck dirver    Social History Main Topics  . Smoking status: Current Every Day Smoker -- 1.00 packs/day for 36 years    Types: Cigarettes  . Smokeless tobacco: Not on file  . Alcohol Use: No  . Drug Use: No  . Sexual Activity: Not on file   Other Topics Concern  . Not on file   Social History Narrative  . No narrative on file   ROS-see HPI Constitutional:   No-   weight loss, night sweats, fevers, chills, fatigue, lassitude. HEENT:   No-  headaches, difficulty swallowing, tooth/dental problems, sore throat,  No-  sneezing, itching, ear ache, nasal congestion, post nasal drip,  CV:  No-   chest pain, orthopnea, PND, swelling in lower extremities, anasarca, dizziness, palpitations Resp: No-   shortness of breath with exertion or at rest.              No-   productive cough,  No non-productive cough,  No- coughing up of blood.              No-   change in color of mucus.  No- wheezing.   Skin: No-   rash or lesions. GI:  No-   heartburn, indigestion, abdominal pain, nausea, vomiting, diarrhea,                 change in bowel habits, loss of appetite GU: No-   dysuria, change in color of urine, no urgency or frequency.  No- flank pain. MS:  No-   joint pain or swelling.  No- decreased range of motion.  No- back  pain. Neuro-     nothing unusual Psych:  No- change in mood or affect. No depression or anxiety.  No memory loss.  OBJ- Physical Exam General- Alert, Oriented, Affect-appropriate, Distress- none acute. Obese Skin- rash-none, lesions- none, excoriation- none Lymphadenopathy- none Head- atraumatic            Eyes- Gross vision intact, PERRLA, conjunctivae and secretions clear            Ears- Hearing, canals-normal            Nose- Clear, no-Septal dev, mucus, polyps, erosion, perforation             Throat- Mallampati II , mucosa clear , drainage- none, tonsils- atrophic. +Missing teeth Neck- flexible , trachea midline, no stridor , thyroid nl, carotid no bruit. +surgical scar Chest - symmetrical excursion , unlabored           Heart/CV- RRR , no murmur , no gallop  , no rub, nl s1 s2                           - JVD- none , edema- none, stasis changes- none, varices- none           Lung- clear to P&A, wheeze- none, cough- none , dullness-none, rub- none           Chest wall-  Abd- tender-no, distended-no, bowel sounds-present, HSM- no Br/ Gen/ Rectal- Not done, not indicated Extrem- cyanosis- none, clubbing, none, atrophy- none, strength- nl Neuro- grossly intact to observation

## 2013-06-13 NOTE — Assessment & Plan Note (Signed)
Discussed importance of smoking cesation

## 2013-06-13 NOTE — Patient Instructions (Addendum)
Order- [facility] schedule NPSG split protocol   Dx OSA  Flu vax  Please give real thought now to stopping smoking, while you can

## 2013-07-09 ENCOUNTER — Other Ambulatory Visit: Payer: Self-pay

## 2013-07-09 DIAGNOSIS — E785 Hyperlipidemia, unspecified: Secondary | ICD-10-CM

## 2013-07-09 MED ORDER — PRAVASTATIN SODIUM 20 MG PO TABS
20.0000 mg | ORAL_TABLET | Freq: Every day | ORAL | Status: DC
Start: 2013-07-09 — End: 2014-01-15

## 2013-07-14 ENCOUNTER — Ambulatory Visit (HOSPITAL_BASED_OUTPATIENT_CLINIC_OR_DEPARTMENT_OTHER): Payer: Medicare Other | Attending: Internal Medicine | Admitting: Radiology

## 2013-07-14 VITALS — Ht 72.0 in | Wt 310.0 lb

## 2013-07-14 DIAGNOSIS — G4733 Obstructive sleep apnea (adult) (pediatric): Secondary | ICD-10-CM

## 2013-07-17 ENCOUNTER — Encounter (HOSPITAL_BASED_OUTPATIENT_CLINIC_OR_DEPARTMENT_OTHER): Payer: Medicare Other

## 2013-07-18 ENCOUNTER — Ambulatory Visit: Payer: Medicare Other | Admitting: Family Medicine

## 2013-07-19 DIAGNOSIS — G4733 Obstructive sleep apnea (adult) (pediatric): Secondary | ICD-10-CM

## 2013-07-20 NOTE — Procedures (Signed)
NAME:  Dakota Mendez, Dakota Mendez               ACCOUNT NO.:  [id number]  MEDICAL RECORD NO.:  [id number]          PATIENT TYPE:  OUT  LOCATION:  SLEEP CENTER                 FACILITY:  [facility]  PHYSICIAN:  Clinton D. Maple Hudson, MD, FCCP, FACPDATE OF BIRTH:  07/10/1963  DATE OF STUDY:  07/14/2013                           NOCTURNAL POLYSOMNOGRAM  REFERRING PHYSICIAN:  Clinton D. Young, MD, FCCP, FACP  INDICATION FOR STUDY:  Hypersomnia with sleep apnea.  EPWORTH SLEEPINESS SCORE:  3/24.  BMI 42.  Weight 310 pounds, height 72 inches, neck 17 inches.  MEDICATIONS:  Home medications are charted for review.  SLEEP ARCHITECTURE:  Total sleep time 157 minutes with sleep efficiency 40.3%.  Stage I was 25.2%, stage II 74.8% stages III and REM were absent.  Sleep latency 132.5 minutes.  Awake after sleep onset 105 minutes.  Arousal index 55.8.  BEDTIME MEDICATION:  Metformin, pravastatin.  RESPIRATORY DATA:  Apnea-hypopnea index (AHI) 50.1 per hour.  A total of 131 events was scored, all as hypopneas associated with nonsupine sleep position.  He did not have enough sleep to meet protocol criteria for split CPAP titration.  OXYGEN DATA:  Moderately loud snoring with oxygen desaturation to a nadir of 85% and mean oxygen saturation through the study of 90.8% on room air.  CARDIAC DATA:  Normal sinus rhythm.  MOVEMENT-PARASOMNIA:  A few limb jerks were noted with insignificant effect on sleep.  Bathroom x2.  IMPRESSIONS-RECOMMENDATIONS: 1. Significant difficulty initiating and maintaining sleep.  Sustained     sleep was not achieved until nearly 2:15 a.m. with no bedtime     sedative taken. 2. Severe obstructive sleep apnea/hypopnea syndrome, AHI 50.1 per     hour.  All events were hypopneas recorded while nonsupine.     Moderately loud snoring with oxygen desaturation to a nadir of 85%     and mean oxygen saturation through the study of 90.8% on room air. 3. He did not sustain sleep early enough to  meet protocol requirements     for CPAP titration on this study.     Consider return for dedicated CPAP titration study or evaluate for     alternative management if clinically appropriate.  Possibly he will     need combined therapies for insomnia and for sleep apnea.     Clinton D. Maple Hudson, MD, [facility], FACP Diplomate, American Board of Sleep Medicine    CDY/MEDQ  D:  07/19/2013 11:03:21  T:  07/20/2013 02:33:59  Job:  [id number]

## 2013-07-23 ENCOUNTER — Ambulatory Visit: Payer: Medicare Other | Admitting: Family Medicine

## 2013-08-04 ENCOUNTER — Ambulatory Visit (INDEPENDENT_AMBULATORY_CARE_PROVIDER_SITE_OTHER): Payer: Medicare Other | Admitting: Internal Medicine

## 2013-08-04 ENCOUNTER — Encounter: Payer: Self-pay | Admitting: Internal Medicine

## 2013-08-04 VITALS — BP 140/90 | HR 119 | Ht 72.0 in | Wt 316.6 lb

## 2013-08-04 DIAGNOSIS — F17200 Nicotine dependence, unspecified, uncomplicated: Secondary | ICD-10-CM

## 2013-08-04 DIAGNOSIS — G4733 Obstructive sleep apnea (adult) (pediatric): Secondary | ICD-10-CM

## 2013-08-04 NOTE — Progress Notes (Signed)
06/13/13- 52 yoM smoker (1ppd) Dr Claris Che Simpson-no sleep study; wakes himself up snoring if really tired.- Wife here They admit snoring, worst on back, so he prefers side sleeping. Told O2 dropped when sedated after C-spine surgery. Not aware of daytime sleepiness. HS 10-11PM with no sleep med. Latency 10-15 minutes, waking twice before up 6:30 AM. No ENT surgery. Not aware of family hx OSA. Disabled truck driver. Medical hx HBP, DMII, obesity. Asks flu vax.  Discussed smoking.  08/04/13-  52 yoM smoker (1ppd) followed for OSA          PCP Dr Lodema Hong- FOLLOWS FOR: review sleep study  NPSG 07/14/13- severe obstructive sleep apnea, AHI 50.1 per hour, weight 310 pounds Educated on treatment options, questions answered.emphasized importance of his weight and his responsibility to drive carefully.  ROS-see HPI Constitutional:   No-   weight loss, night sweats, fevers, chills, fatigue, lassitude. HEENT:   No-  headaches, difficulty swallowing, tooth/dental problems, sore throat,       No-  sneezing, itching, ear ache, nasal congestion, post nasal drip,  CV:  No-   chest pain, orthopnea, PND, swelling in lower extremities, anasarca, dizziness, palpitations Resp: No-   shortness of breath with exertion or at rest.              No-   productive cough,  No non-productive cough,  No- coughing up of blood.              No-   change in color of mucus.  No- wheezing.   Skin: No-   rash or lesions. GI:  No-   heartburn, indigestion, abdominal pain, nausea, vomiting,  GU: . MS:  No-   joint pain or swelling.   Neuro-     nothing unusual Psych:  No- change in mood or affect. No depression or anxiety.  No memory loss.  OBJ- Physical Exam General- Alert, Oriented, Affect-appropriate, Distress- none acute. Obese Skin- rash-none, lesions- none, excoriation- none Lymphadenopathy- none Head- atraumatic            Eyes- Gross vision intact, PERRLA, conjunctivae and secretions clear            Ears-  Hearing, canals-normal            Nose- Clear, no-Septal dev, mucus, polyps, erosion, perforation             Throat- Mallampati III , mucosa clear , drainage- none, tonsils- atrophic. +Missing teeth Neck- flexible , trachea midline, no stridor , thyroid nl, carotid no bruit. +surgical scar Chest - symmetrical excursion , unlabored           Heart/CV- RRR , no murmur , no gallop  , no rub, nl s1 s2                           - JVD- none , edema- none, stasis changes- none, varices- none           Lung- clear to P&A, wheeze- none, cough- none , dullness-none, rub- none           Chest wall-  Abd-  Br/ Gen/ Rectal- Not done, not indicated Extrem- cyanosis- none, clubbing, none, atrophy- none, strength- nl Neuro- grossly intact to observation

## 2013-08-04 NOTE — Patient Instructions (Addendum)
Order- [facility] new CPAP autotitration 5-20 cwp, mask of choice, humidifier, supplies    Dx OSA  Smoking cessation class information  It will help you to lose some weight.

## 2013-08-18 ENCOUNTER — Telehealth: Payer: Self-pay | Admitting: Internal Medicine

## 2013-08-18 NOTE — Telephone Encounter (Signed)
Will forward to Dr. Young as an FYI 

## 2013-08-18 NOTE — Telephone Encounter (Signed)
Noted  

## 2013-08-19 NOTE — Assessment & Plan Note (Signed)
Appropriate the issue again. He accepts referral to [facility] smoking cessation program

## 2013-08-19 NOTE — Assessment & Plan Note (Addendum)
Severe obstructive sleep apnea. Education done. Plan-begin CPAP

## 2013-08-22 ENCOUNTER — Telehealth: Payer: Self-pay | Admitting: Internal Medicine

## 2013-08-22 NOTE — Telephone Encounter (Signed)
Will forward to CDY as an FYI 

## 2013-08-22 NOTE — Telephone Encounter (Signed)
Noted  

## 2013-08-25 ENCOUNTER — Ambulatory Visit: Payer: Medicare Other | Admitting: Family Medicine

## 2013-09-16 ENCOUNTER — Encounter: Payer: Self-pay | Admitting: Family Medicine

## 2013-09-16 ENCOUNTER — Ambulatory Visit (INDEPENDENT_AMBULATORY_CARE_PROVIDER_SITE_OTHER): Payer: Commercial Managed Care - HMO | Admitting: Family Medicine

## 2013-09-16 VITALS — BP 120/82 | HR 100 | Resp 18 | Ht 72.0 in | Wt 315.0 lb

## 2013-09-16 DIAGNOSIS — IMO0002 Reserved for concepts with insufficient information to code with codable children: Secondary | ICD-10-CM

## 2013-09-16 DIAGNOSIS — Z91199 Patient's noncompliance with other medical treatment and regimen due to unspecified reason: Secondary | ICD-10-CM

## 2013-09-16 DIAGNOSIS — E669 Obesity, unspecified: Secondary | ICD-10-CM

## 2013-09-16 DIAGNOSIS — F17200 Nicotine dependence, unspecified, uncomplicated: Secondary | ICD-10-CM

## 2013-09-16 DIAGNOSIS — Z9119 Patient's noncompliance with other medical treatment and regimen: Secondary | ICD-10-CM

## 2013-09-16 DIAGNOSIS — I10 Essential (primary) hypertension: Secondary | ICD-10-CM

## 2013-09-16 DIAGNOSIS — E785 Hyperlipidemia, unspecified: Secondary | ICD-10-CM

## 2013-09-16 DIAGNOSIS — IMO0001 Reserved for inherently not codable concepts without codable children: Secondary | ICD-10-CM

## 2013-09-16 DIAGNOSIS — N529 Male erectile dysfunction, unspecified: Secondary | ICD-10-CM

## 2013-09-16 DIAGNOSIS — Z125 Encounter for screening for malignant neoplasm of prostate: Secondary | ICD-10-CM

## 2013-09-16 DIAGNOSIS — E1165 Type 2 diabetes mellitus with hyperglycemia: Secondary | ICD-10-CM

## 2013-09-16 DIAGNOSIS — G4733 Obstructive sleep apnea (adult) (pediatric): Secondary | ICD-10-CM

## 2013-09-16 LAB — COMPLETE METABOLIC PANEL WITH GFR
ALT: 15 U/L (ref 0–53)
AST: 11 U/L (ref 0–37)
Albumin: 4.1 g/dL (ref 3.5–5.2)
Alkaline Phosphatase: 72 U/L (ref 39–117)
BUN: 15 mg/dL (ref 6–23)
CO2: 27 mEq/L (ref 19–32)
Calcium: 9.1 mg/dL (ref 8.4–10.5)
Chloride: 100 mEq/L (ref 96–112)
Creat: 0.91 mg/dL (ref 0.50–1.35)
GFR, Est African American: 113 mL/min
GFR, Est Non African American: 98 mL/min
Glucose, Bld: 172 mg/dL — ABNORMAL HIGH (ref 70–99)
Potassium: 4.1 mEq/L (ref 3.5–5.3)
Sodium: 137 mEq/L (ref 135–145)
Total Bilirubin: 0.8 mg/dL (ref 0.3–1.2)
Total Protein: 7 g/dL (ref 6.0–8.3)

## 2013-09-16 LAB — HEMOGLOBIN A1C
Hgb A1c MFr Bld: 8.2 % — ABNORMAL HIGH (ref <5.7)
Mean Plasma Glucose: 189 mg/dL — ABNORMAL HIGH (ref <117)

## 2013-09-16 LAB — LIPID PANEL
Cholesterol: 168 mg/dL (ref 0–200)
HDL: 41 mg/dL (ref >39)
LDL Cholesterol: 72 mg/dL (ref 0–99)
Total CHOL/HDL Ratio: 4.1 Ratio
Triglycerides: 274 mg/dL — ABNORMAL HIGH (ref <150)
VLDL: 55 mg/dL — ABNORMAL HIGH (ref 0–40)

## 2013-09-16 MED ORDER — TADALAFIL 20 MG PO TABS: 20.0000 mg | ORAL_TABLET | Freq: Every day | ORAL | Status: DC | PRN

## 2013-09-16 NOTE — Progress Notes (Signed)
Subjective:    Patient ID: Dakota Mendez, male    DOB: 1961/06/03, 53 y.o.   MRN: [id number]  HPI The PT is here for follow up and re-evaluation of chronic medical conditions, medication management and review of any available recent lab and radiology data.  Preventive health is updated, specifically  Cancer screening and Immunization.  Still needs to sched colonoscopy Questions or concerns regarding consultations or procedures which the PT has had in the interim are  Addressed.Fully evaluated and has severe sleep apnea The PT denies any adverse reactions to current medications since the last visit. Has not been taking diabetic med as prescribed, not testing blood sugars and HBA1C has increased again There are no new concerns.  There are no specific complaints       Review of Systems See HPI Denies recent fever or chills. Denies sinus pressure, nasal congestion, ear pain or sore throat. Denies chest congestion, productive cough or wheezing. Denies chest pains, palpitations and leg swelling Denies abdominal pain, nausea, vomiting,diarrhea or constipation.   Denies dysuria, frequency, hesitancy or incontinence. Denies joint pain, swelling and limitation in mobility. Denies headaches, seizures, numbness, or tingling. Denies depression, anxiety or insomnia. Denies skin break down or rash.        Objective:   Physical Exam BP 120/82  Pulse 100  Resp 18  Ht 6' (1.829 m)  Wt 315 lb (142.883 kg)  BMI 42.71 kg/m2  SpO2 99% Patient alert and oriented and in no cardiopulmonary distress.  HEENT: No facial asymmetry, EOMI, no sinus tenderness,  oropharynx pink and moist.  Neck supple no adenopathy.  Chest: Clear to auscultation bilaterally.  CVS: S1, S2 no murmurs, no S3.  ABD: Soft non tender. Bowel sounds normal.  Ext: No edema  MS: Adequate ROM spine, shoulders, hips and knees.  Skin: Intact, no ulcerations or rash noted.  Psych: Good eye contact, normal affect.  Memory intact not anxious or depressed appearing.  CNS: CN 2-12 intact, power, tone and sensation normal throughout.        Assessment & Plan:  Type 2 diabetes mellitus, uncontrolled Deteriorated. Compliance an issue. Medication was ot being taken as difrected Patient advised to reduce carb and sweets, commit to regular physical activity, take meds as prescribed, test blood as directed, and attempt to lose weight, to improve blood sugar control. Updated lab needed at/ before next visit.   TOBACCO ABUSE Patient counseled for approximately 5 minutes regarding the health risks of ongoing nicotine use, specifically all types of cancer, heart disease, stroke and respiratory failure. The options available for help with cessation ,the behavioral changes to assist the process, and the option to either gradully reduce usage  Or abruptly stop.is also discussed. Pt is also encouraged to set specific goals in number of cigarettes used daily, as well as to set a quit date.   OBESITY unchanged. Patient re-educated about  the importance of commitment to a  minimum of 150 minutes of exercise per week. The importance of healthy food choices with portion control discussed. Encouraged to start a food diary, count calories and to consider  joining a support group. Sample diet sheets offered. Goals set by the patient for the next several months.     HYPERLIPIDEMIA Hyperlipidemia:Low fat diet discussed and encouraged.  Elevated TG, dietary change conti nue current med  Obstructive sleep apnea Recently diagnosed and he is encouraged to absolutely uses CPAP every night  HYPERTENSION Controlled, no change in medication DASH diet and commitment to  daily physical activity for a minimum of 30 minutes discussed and encouraged, as a part of hypertension management. The importance of attaining a healthy weight is also discussed.   ED (erectile dysfunction) Med prescribed for use as needed, advised him  to consider pump, due to high cost of tabs , unwilling to do this now  Medically noncompliant Colonoscopy still not scheduled by pt though he has been referred and received the ;letter. Has been non compliant with treatment plan fo diabetes with deterioration

## 2013-09-16 NOTE — Patient Instructions (Addendum)
F/u in 4 month, call if you need me before  We will contact you re labs  Pls get next labs 4 to 7 days before f/u visit   cmp and EGFR, PSA, , HBA1C, and microalb  . Call and schedule appt to see Dr Laural Golden colonoscopy past due  You are referred for eye exam  Blood sugar testing is once daily only

## 2013-09-17 ENCOUNTER — Other Ambulatory Visit: Payer: Self-pay

## 2013-09-17 ENCOUNTER — Other Ambulatory Visit: Payer: Self-pay | Admitting: Family Medicine

## 2013-09-17 MED ORDER — GLIPIZIDE ER 10 MG PO TB24: 10.0000 mg | ORAL_TABLET | Freq: Every day | ORAL | Status: DC

## 2013-10-06 ENCOUNTER — Ambulatory Visit: Payer: Medicare Other | Admitting: Internal Medicine

## 2013-10-23 ENCOUNTER — Telehealth: Payer: Self-pay | Admitting: Family Medicine

## 2013-10-23 MED ORDER — CYCLOBENZAPRINE HCL 10 MG PO TABS: 10.0000 mg | ORAL_TABLET | Freq: Every evening | ORAL | Status: DC | PRN

## 2013-10-23 NOTE — Telephone Encounter (Signed)
pls erx flexeril 10mg  at bedtime #30 , if he wants to start with that, needs ov for further management

## 2013-10-23 NOTE — Addendum Note (Signed)
Addended by: Denman George B on: 10/23/2013 05:09 PM   Modules accepted: Orders

## 2013-10-23 NOTE — Telephone Encounter (Signed)
Please advise.  Patient in for ov in Jan.  No note of problem seen.

## 2013-10-31 ENCOUNTER — Ambulatory Visit: Payer: Commercial Managed Care - HMO | Admitting: Internal Medicine

## 2013-11-02 NOTE — Assessment & Plan Note (Signed)
Hyperlipidemia:Low fat diet discussed and encouraged.  Elevated TG, dietary change conti nue current med

## 2013-11-02 NOTE — Assessment & Plan Note (Signed)
Med prescribed for use as needed, advised him to consider pump, due to high cost of tabs , unwilling to do this now

## 2013-11-02 NOTE — Assessment & Plan Note (Signed)
Controlled, no change in medication DASH diet and commitment to daily physical activity for a minimum of 30 minutes discussed and encouraged, as a part of hypertension management. The importance of attaining a healthy weight is also discussed.  

## 2013-11-02 NOTE — Assessment & Plan Note (Signed)
Deteriorated. Compliance an issue. Medication was ot being taken as difrected Patient advised to reduce carb and sweets, commit to regular physical activity, take meds as prescribed, test blood as directed, and attempt to lose weight, to improve blood sugar control. Updated lab needed at/ before next visit.

## 2013-11-02 NOTE — Assessment & Plan Note (Signed)
unchanged Patient re-educated about  the importance of commitment to a  minimum of 150 minutes of exercise per week. The importance of healthy food choices with portion control discussed. Encouraged to start a food diary, count calories and to consider  joining a support group. Sample diet sheets offered. Goals set by the patient for the next several months.    

## 2013-11-02 NOTE — Assessment & Plan Note (Signed)

## 2013-11-02 NOTE — Assessment & Plan Note (Signed)
Recently diagnosed and he is encouraged to absolutely uses CPAP every night

## 2013-11-02 NOTE — Assessment & Plan Note (Signed)
Colonoscopy still not scheduled by pt though he has been referred and received the ;letter. Has been non compliant with treatment plan fo diabetes with deterioration

## 2013-11-27 ENCOUNTER — Ambulatory Visit: Payer: Medicare HMO | Admitting: Internal Medicine

## 2013-12-11 ENCOUNTER — Ambulatory Visit (INDEPENDENT_AMBULATORY_CARE_PROVIDER_SITE_OTHER)
Admission: RE | Admit: 2013-12-11 | Discharge: 2013-12-11 | Disposition: A | Discharge status: Home / Self Care | Payer: Medicare HMO | Source: Ambulatory Visit | Attending: Internal Medicine | Admitting: Internal Medicine

## 2013-12-11 ENCOUNTER — Encounter: Payer: Self-pay | Admitting: Internal Medicine

## 2013-12-11 ENCOUNTER — Ambulatory Visit (INDEPENDENT_AMBULATORY_CARE_PROVIDER_SITE_OTHER): Payer: Commercial Managed Care - HMO | Admitting: Internal Medicine

## 2013-12-11 VITALS — BP 142/78 | HR 105 | Ht 72.0 in | Wt 320.0 lb

## 2013-12-11 DIAGNOSIS — Z720 Tobacco use: Secondary | ICD-10-CM

## 2013-12-11 DIAGNOSIS — F17200 Nicotine dependence, unspecified, uncomplicated: Secondary | ICD-10-CM

## 2013-12-11 DIAGNOSIS — M542 Cervicalgia: Secondary | ICD-10-CM

## 2013-12-11 DIAGNOSIS — G4733 Obstructive sleep apnea (adult) (pediatric): Secondary | ICD-10-CM

## 2013-12-11 NOTE — Patient Instructions (Signed)
Order- [facility] - There is order in place for AutoCPAP through [facility]- needs financial asistance if available  Order- [facility]   Dx tobacco abuse  Please stop smoking

## 2013-12-11 NOTE — Progress Notes (Signed)
06/13/13- 83 yoM smoker (1ppd) Dr Joycelyn Schmid Simpson-no sleep study; wakes himself up snoring if really tired.- Wife here They admit snoring, worst on back, so he prefers side sleeping. Told O2 dropped when sedated after C-spine surgery. Not aware of daytime sleepiness. HS 10-11PM with no sleep med. Latency 10-15 minutes, waking twice before up 6:30 AM. No ENT surgery. Not aware of family hx OSA. Disabled truck driver. Medical hx HBP, DMII, obesity. Asks flu vax.  Discussed smoking.  08/04/13-  53 yoM smoker (1ppd) followed for OSA          PCP Dr Moshe Cipro- FOLLOWS FOR: review sleep study  NPSG 07/14/13- severe obstructive sleep apnea, AHI 50.1 per hour, weight 310 pounds Educated on treatment options, questions answered.emphasized importance of his weight and his responsibility to drive carefully.  12/11/13- 18 yoM smoker (1ppd) followed for OSA, complicated by tobacco use, DGD         PCP Dr Moshe Cipro- FOLLOWS FOR:  Has not yet started CPAP due to financial situation-States will get machine soon.   DME - [facility] Smoking without effort to stop. Denies cough  ROS-see HPI Constitutional:   No-   weight loss, night sweats, fevers, chills, fatigue, lassitude. HEENT:   No-  headaches, difficulty swallowing, tooth/dental problems, sore throat,       No-  sneezing, itching, ear ache, nasal congestion, post nasal drip,  CV:  No-   chest pain, orthopnea, PND, swelling in lower extremities, anasarca, dizziness, palpitations Resp: No-   shortness of breath with exertion or at rest.              No-   productive cough,  No non-productive cough,  No- coughing up of blood.              No-   change in color of mucus.  No- wheezing.   Skin: No-   rash or lesions. GI:  No-   heartburn, indigestion, abdominal pain, nausea, vomiting,  GU: . MS:  No-   joint pain or swelling.   Neuro-     nothing unusual Psych:  No- change in mood or affect. No depression or anxiety.  No memory loss.  OBJ- Physical Exam General-  Alert, Oriented, Affect-appropriate, Distress- none acute. Obese Skin- rash-none, lesions- none, excoriation- none Lymphadenopathy- none Head- atraumatic            Eyes- Gross vision intact, PERRLA, conjunctivae and secretions clear            Ears- Hearing, canals-normal            Nose- Clear, no-Septal dev, mucus, polyps, erosion, perforation             Throat- Mallampati III , mucosa clear , drainage- none, tonsils- atrophic. +Missing teeth Neck- flexible , trachea midline, no stridor , thyroid nl, carotid no bruit. +surgical scar Chest - symmetrical excursion , unlabored           Heart/CV- RRR , no murmur , no gallop  , no rub, nl s1 s2                           - JVD- none , edema- none, stasis changes- none, varices- none           Lung- clear to P&A, wheeze- none, cough- none , dullness-none, rub- none           Chest wall-  Abd-  Br/ Gen/ Rectal- Not  done, not indicated Extrem- cyanosis- none, clubbing, none, atrophy- none, strength- nl Neuro- grossly intact to observation

## 2013-12-16 NOTE — Progress Notes (Signed)
Quick Note:  Called and spoke to pt regarding results. Pt verbalized understanding and denied any further questions or concerns at this time. ______ 

## 2013-12-19 ENCOUNTER — Telehealth: Payer: Self-pay | Admitting: Internal Medicine

## 2013-12-19 DIAGNOSIS — G4733 Obstructive sleep apnea (adult) (pediatric): Secondary | ICD-10-CM

## 2013-12-19 NOTE — Telephone Encounter (Signed)
Order has been sent to pcc's.

## 2013-12-19 NOTE — Telephone Encounter (Signed)
AutoPAP 5-20 cwp. He can speak to his DME about any financial assistance, and they can con.tact our PCCs

## 2013-12-19 NOTE — Telephone Encounter (Signed)
Per OV 12/11/13: Order- [facility] - There is order in place for AutoCPAP through [facility]- needs financial asistance if available --  Please advise Dr. Annamaria Boots what settings pt needs for autoCPAP. thanks

## 2014-01-11 NOTE — Assessment & Plan Note (Signed)
Emphasis on smoking cessation  Plan-counseling, chest x-ray

## 2014-01-11 NOTE — Assessment & Plan Note (Signed)
We talked again about assistance so he can get CPAP

## 2014-01-14 LAB — COMPLETE METABOLIC PANEL WITH GFR
ALBUMIN: 4.2 g/dL (ref 3.5–5.2)
ALT: 12 U/L (ref 0–53)
AST: 10 U/L (ref 0–37)
Alkaline Phosphatase: 60 U/L (ref 39–117)
BUN: 17 mg/dL (ref 6–23)
CALCIUM: 9.2 mg/dL (ref 8.4–10.5)
CO2: 26 meq/L (ref 19–32)
Chloride: 101 mEq/L (ref 96–112)
Creat: 1.05 mg/dL (ref 0.50–1.35)
GFR, EST NON AFRICAN AMERICAN: 82 mL/min
GLUCOSE: 144 mg/dL — AB (ref 70–99)
POTASSIUM: 4.2 meq/L (ref 3.5–5.3)
Sodium: 139 mEq/L (ref 135–145)
TOTAL PROTEIN: 7.1 g/dL (ref 6.0–8.3)
Total Bilirubin: 0.6 mg/dL (ref 0.2–1.2)

## 2014-01-14 LAB — HEMOGLOBIN A1C
Hgb A1c MFr Bld: 6.6 % — ABNORMAL HIGH (ref <5.7)
Mean Plasma Glucose: 143 mg/dL — ABNORMAL HIGH (ref <117)

## 2014-01-15 ENCOUNTER — Other Ambulatory Visit: Payer: Self-pay

## 2014-01-15 ENCOUNTER — Encounter: Payer: Self-pay | Admitting: Family Medicine

## 2014-01-15 ENCOUNTER — Ambulatory Visit (INDEPENDENT_AMBULATORY_CARE_PROVIDER_SITE_OTHER): Payer: Medicare HMO | Admitting: Family Medicine

## 2014-01-15 VITALS — BP 130/94 | HR 96 | Resp 16 | Wt 314.0 lb

## 2014-01-15 DIAGNOSIS — E119 Type 2 diabetes mellitus without complications: Secondary | ICD-10-CM

## 2014-01-15 DIAGNOSIS — IMO0001 Reserved for inherently not codable concepts without codable children: Secondary | ICD-10-CM

## 2014-01-15 DIAGNOSIS — E785 Hyperlipidemia, unspecified: Secondary | ICD-10-CM

## 2014-01-15 DIAGNOSIS — IMO0002 Reserved for concepts with insufficient information to code with codable children: Secondary | ICD-10-CM

## 2014-01-15 DIAGNOSIS — G4733 Obstructive sleep apnea (adult) (pediatric): Secondary | ICD-10-CM

## 2014-01-15 DIAGNOSIS — F17200 Nicotine dependence, unspecified, uncomplicated: Secondary | ICD-10-CM

## 2014-01-15 DIAGNOSIS — E669 Obesity, unspecified: Secondary | ICD-10-CM

## 2014-01-15 DIAGNOSIS — I10 Essential (primary) hypertension: Secondary | ICD-10-CM

## 2014-01-15 DIAGNOSIS — R809 Proteinuria, unspecified: Secondary | ICD-10-CM

## 2014-01-15 DIAGNOSIS — E1165 Type 2 diabetes mellitus with hyperglycemia: Secondary | ICD-10-CM

## 2014-01-15 DIAGNOSIS — B351 Tinea unguium: Secondary | ICD-10-CM

## 2014-01-15 DIAGNOSIS — Z1211 Encounter for screening for malignant neoplasm of colon: Secondary | ICD-10-CM

## 2014-01-15 DIAGNOSIS — N529 Male erectile dysfunction, unspecified: Secondary | ICD-10-CM

## 2014-01-15 LAB — PSA, MEDICARE: PSA: 0.98 ng/mL (ref <=4.00)

## 2014-01-15 LAB — MICROALBUMIN / CREATININE URINE RATIO
Creatinine, Urine: 185.9 mg/dL
Microalb Creat Ratio: 10.8 mg/g (ref 0.0–30.0)
Microalb, Ur: 2.01 mg/dL — ABNORMAL HIGH (ref 0.00–1.89)

## 2014-01-15 MED ORDER — AMLODIPINE BESYLATE 10 MG PO TABS: 10.0000 mg | ORAL_TABLET | Freq: Every day | ORAL | Status: DC

## 2014-01-15 MED ORDER — PRAVASTATIN SODIUM 20 MG PO TABS: 20.0000 mg | ORAL_TABLET | Freq: Every day | ORAL | Status: DC

## 2014-01-15 MED ORDER — TERBINAFINE HCL 250 MG PO TABS: 250.0000 mg | ORAL_TABLET | Freq: Every day | ORAL | Status: DC

## 2014-01-15 MED ORDER — LOSARTAN POTASSIUM-HCTZ 100-25 MG PO TABS: 1.0000 | ORAL_TABLET | Freq: Every day | ORAL | Status: DC

## 2014-01-15 MED ORDER — METFORMIN HCL 1000 MG PO TABS: 1000.0000 mg | ORAL_TABLET | Freq: Two times a day (BID) | ORAL | Status: DC

## 2014-01-15 NOTE — Patient Instructions (Addendum)
F/u in 4 month, call if you need me before  Please get additional bP med you need this Today  Start 10 ciggs per day for May and reduce each 2 to 4 weeks    COnGRATS on blood sugar and weight loss, keep it up  New is terbinafine daily for toenail fungus  You are referred for eye exam  You are referred for colonoscopy in July  Smoking Cessation, Tips for Success If you are ready to quit smoking, congratulations! You have chosen to help yourself be healthier. Cigarettes bring nicotine, tar, carbon monoxide, and other irritants into your body. Your lungs, heart, and blood vessels will be able to work better without these poisons. There are many different ways to quit smoking. Nicotine gum, nicotine patches, a nicotine inhaler, or nicotine nasal spray can help with physical craving. Hypnosis, support groups, and medicines help break the habit of smoking. WHAT THINGS CAN I DO TO MAKE QUITTING EASIER?  Here are some tips to help you quit for good:  Pick a date when you will quit smoking completely. Tell all of your friends and family about your plan to quit on that date.  Do not try to slowly cut down on the number of cigarettes you are smoking. Pick a quit date and quit smoking completely starting on that day.  Throw away all cigarettes.   Clean and remove all ashtrays from your home, work, and car.   On a card, write down your reasons for quitting. Carry the card with you and read it when you get the urge to smoke.   Cleanse your body of nicotine. Drink enough water and fluids to keep your urine clear or pale yellow. Do this after quitting to flush the nicotine from your body.   Learn to predict your moods. Do not let a bad situation be your excuse to have a cigarette. Some situations in your life might tempt you into wanting a cigarette.   Never have "just one" cigarette. It leads to wanting another and another. Remind yourself of your decision to quit.   Change habits  associated with smoking. If you smoked while driving or when feeling stressed, try other activities to replace smoking. Stand up when drinking your coffee. Brush your teeth after eating. Sit in a different chair when you read the paper. Avoid alcohol while trying to quit, and try to drink fewer caffeinated beverages. Alcohol and caffeine may urge you to smoke.   Avoid foods and drinks that can trigger a desire to smoke, such as sugary or spicy foods and alcohol.   Ask people who smoke not to smoke around you.   Have something planned to do right after eating or having a cup of coffee. For example, plan to take a walk or exercise.   Try a relaxation exercise to calm you down and decrease your stress. Remember, you may be tense and nervous for the first 2 weeks after you quit, but this will pass.   Find new activities to keep your hands busy. Play with a pen, coin, or rubber band. Doodle or draw things on paper.   Brush your teeth right after eating. This will help cut down on the craving for the taste of tobacco after meals. You can also try mouthwash.   Use oral substitutes in place of cigarettes. Try using lemon drops, carrots, cinnamon sticks, or chewing gum. Keep them handy so they are available when you have the urge to smoke.   When you  have the urge to smoke, try deep breathing.   Designate your home as a nonsmoking area.   If you are a heavy smoker, ask your health care provider about a prescription for nicotine chewing gum. It can ease your withdrawal from nicotine.   Reward yourself. Set aside the cigarette money you save and buy yourself something nice.   Look for support from others. Join a support group or smoking cessation program. Ask someone at home or at work to help you with your plan to quit smoking.   Always ask yourself, "Do I need this cigarette or is this just a reflex?" Tell yourself, "Today, I choose not to smoke," or "I do not want to smoke." You are  reminding yourself of your decision to quit.  Do not replace cigarette smoking with electronic cigarettes (commonly called e-cigarettes). The safety of e-cigarettes is unknown, and some may contain harmful chemicals.  If you relapse, do not give up! Plan ahead and think about what you will do the next time you get the urge to smoke.  HOW WILL I FEEL WHEN I QUIT SMOKING? You may have symptoms of withdrawal because your body is used to nicotine (the addictive substance in cigarettes). You may crave cigarettes, be irritable, feel very hungry, cough often, get headaches, or have difficulty concentrating. The withdrawal symptoms are only temporary. They are strongest when you first quit but will go away within 10 14 days. When withdrawal symptoms occur, stay in control. Think about your reasons for quitting. Remind yourself that these are signs that your body is healing and getting used to being without cigarettes. Remember that withdrawal symptoms are easier to treat than the major diseases that smoking can cause.  Even after the withdrawal is over, expect periodic urges to smoke. However, these cravings are generally short lived and will go away whether you smoke or not. Do not smoke!  WHAT RESOURCES ARE AVAILABLE TO HELP ME QUIT SMOKING? Your health care provider can direct you to community resources or hospitals for support, which may include:  Group support.  Education.  Hypnosis.  Therapy. Document Released: 05/26/2004 Document Revised: 06/18/2013 Document Reviewed: 02/13/2013 [facility] Patient Information 2014 [place], [place].

## 2014-01-18 NOTE — Assessment & Plan Note (Signed)
Improved but tG elevated Hyperlipidemia:Low fat diet discussed and encouraged.

## 2014-01-18 NOTE — Assessment & Plan Note (Signed)
Unchanged, needs to seriously commit to quitting Patient counseled for approximately 5 minutes regarding the health risks of ongoing nicotine use, specifically all types of cancer, heart disease, stroke and respiratory failure. The options available for help with cessation ,the behavioral changes to assist the process, and the option to either gradully reduce usage  Or abruptly stop.is also discussed. Pt is also encouraged to set specific goals in number of cigarettes used daily, as well as to set a quit date.

## 2014-01-18 NOTE — Assessment & Plan Note (Signed)
Marked improvement and now controlled Applauded on this Patient advised to reduce carb and sweets, commit to regular physical activity, take meds as prescribed, test blood as directed, and attempt to lose weight, to improve blood sugar control.

## 2014-01-18 NOTE — Assessment & Plan Note (Addendum)
Has completed assesment and is  in process of obtaining  Equipment, understands he importance of compliance

## 2014-01-18 NOTE — Assessment & Plan Note (Signed)
Improved. Pt applauded on succesful weight loss through lifestyle change, and encouraged to continue same. Weight loss goal set for the next several months.  

## 2014-01-18 NOTE — Assessment & Plan Note (Signed)
Uncontrolled, pt out of one of his meds, he is to collect this today DASH diet and commitment to daily physical activity for a minimum of 30 minutes discussed and encouraged, as a part of hypertension management. The importance of attaining a healthy weight is also discussed.

## 2014-01-18 NOTE — Assessment & Plan Note (Signed)
Responds to cialis but med cost an issue, advised to consider urology consult for use of an assistive device

## 2014-01-18 NOTE — Progress Notes (Signed)
Subjective:    Patient ID: Dakota Mendez, male    DOB: 08/05/1961, 53 y.o.   MRN: [id number]  HPI The PT is here for follow up and re-evaluation of chronic medical conditions, medication management and review of any available recent lab and radiology data.  Preventive health is updated, specifically  Cancer screening and Immunization. Still needs colonoscopy  And he is to call for appointment, he is being referred again did not go last year though discussed Questions or concerns regarding consultations or procedures which the PT has had in the interim are  Addressed.Pulmonary eval asserts severe sleep apnea and he is being set up for CPAP The PT denies any adverse reactions to current medications since the last visit.  There are no new concerns.  There are no specific complaints  Has worked on change in diet and med adherence, still needs to commit to more freequent exercise, however has had successful weight loss Denies polyuria, polydipsia or hypoglycemic episodes. States he feels better now that blood sugars are improved, still has fatigue due to poor sleeep      Review of Systems See HPI Denies recent fever or chills. Denies sinus pressure, nasal congestion, ear pain or sore throat. Denies chest congestion, productive cough or wheezing. Denies chest pains, palpitations and leg swelling Denies abdominal pain, nausea, vomiting,diarrhea or constipation.   Denies dysuria, frequency, hesitancy or incontinence. Chronic  joint pain,  and limitation in mobility. Denies headaches, seizures, numbness, or tingling. Denies depression, anxiety or insomnia. Denies skin break down or rash.        Objective:   Physical Exam BP 130/94  Pulse 96  Resp 16  Wt 314 lb (142.429 kg)  SpO2 99% Patient alert and oriented and in no cardiopulmonary distress.  HEENT: No facial asymmetry, EOMI, no sinus tenderness,  oropharynx pink and moist.  Neck decreased though adequate ROM, no JVd, no  adenopathy.  Chest: Clear to auscultation bilaterally.  CVS: S1, S2 no murmurs, no S3.  ABD: Soft non tender. Bowel sounds normal.  Ext: No edema  MS: Adequate though reduced  ROM spine, shoulders, hips and knees.  Skin: Intact, no ulcerations or rash noted.  Psych: Good eye contact, normal affect. Memory intact not anxious or depressed appearing.  CNS: CN 2-12 intact, power, tone and sensation normal throughout.        Assessment & Plan:  HYPERTENSION Uncontrolled, pt out of one of his meds, he is to collect this today DASH diet and commitment to daily physical activity for a minimum of 30 minutes discussed and encouraged, as a part of hypertension management. The importance of attaining a healthy weight is also discussed.   Type 2 diabetes mellitus, uncontrolled Marked improvement and now controlled Applauded on this Patient advised to reduce carb and sweets, commit to regular physical activity, take meds as prescribed, test blood as directed, and attempt to lose weight, to improve blood sugar control.   TOBACCO ABUSE Unchanged, needs to seriously commit to quitting Patient counseled for approximately 5 minutes regarding the health risks of ongoing nicotine use, specifically all types of cancer, heart disease, stroke and respiratory failure. The options available for help with cessation ,the behavioral changes to assist the process, and the option to either gradully reduce usage  Or abruptly stop.is also discussed. Pt is also encouraged to set specific goals in number of cigarettes used daily, as well as to set a quit date.   OBESITY Improved. Pt applauded on succesful weight loss  through lifestyle change, and encouraged to continue same. Weight loss goal set for the next several months.   HYPERLIPIDEMIA Improved but tG elevated Hyperlipidemia:Low fat diet discussed and encouraged.    ED (erectile dysfunction) Responds to cialis but med cost an issue, advised  to consider urology consult for use of an assistive device  Obstructive sleep apnea Has completed assesment and is  in process of obtaining  Equipment, understands he importance of compliance

## 2014-01-19 ENCOUNTER — Encounter (INDEPENDENT_AMBULATORY_CARE_PROVIDER_SITE_OTHER): Payer: Self-pay | Admitting: *Deleted

## 2014-05-19 ENCOUNTER — Ambulatory Visit: Payer: Medicare HMO | Admitting: Family Medicine

## 2014-06-12 ENCOUNTER — Ambulatory Visit: Payer: Medicare HMO | Admitting: Internal Medicine

## 2014-06-12 ENCOUNTER — Encounter: Payer: Self-pay | Admitting: Internal Medicine

## 2014-06-16 LAB — COMPREHENSIVE METABOLIC PANEL
ALBUMIN: 3.9 g/dL (ref 3.5–5.2)
ALT: 14 U/L (ref 0–53)
AST: 11 U/L (ref 0–37)
Alkaline Phosphatase: 69 U/L (ref 39–117)
BUN: 19 mg/dL (ref 6–23)
CALCIUM: 9.3 mg/dL (ref 8.4–10.5)
CHLORIDE: 100 meq/L (ref 96–112)
CO2: 27 meq/L (ref 19–32)
Creat: 1.15 mg/dL (ref 0.50–1.35)
Glucose, Bld: 148 mg/dL — ABNORMAL HIGH (ref 70–99)
POTASSIUM: 4.4 meq/L (ref 3.5–5.3)
Sodium: 138 mEq/L (ref 135–145)
Total Bilirubin: 0.6 mg/dL (ref 0.2–1.2)
Total Protein: 6.9 g/dL (ref 6.0–8.3)

## 2014-06-16 LAB — LIPID PANEL
CHOL/HDL RATIO: 3.9 ratio
CHOLESTEROL: 159 mg/dL (ref 0–200)
HDL: 41 mg/dL (ref >39)
LDL Cholesterol: 72 mg/dL (ref 0–99)
TRIGLYCERIDES: 228 mg/dL — AB (ref <150)
VLDL: 46 mg/dL — ABNORMAL HIGH (ref 0–40)

## 2014-06-16 LAB — HEMOGLOBIN A1C
Hgb A1c MFr Bld: 7.3 % — ABNORMAL HIGH (ref <5.7)
Mean Plasma Glucose: 163 mg/dL — ABNORMAL HIGH (ref <117)

## 2014-06-18 ENCOUNTER — Encounter: Payer: Self-pay | Admitting: Family Medicine

## 2014-06-18 ENCOUNTER — Ambulatory Visit (INDEPENDENT_AMBULATORY_CARE_PROVIDER_SITE_OTHER): Payer: Commercial Managed Care - HMO | Admitting: Family Medicine

## 2014-06-18 VITALS — BP 124/82 | HR 110 | Resp 18 | Ht 72.0 in | Wt 316.0 lb

## 2014-06-18 DIAGNOSIS — Z720 Tobacco use: Secondary | ICD-10-CM

## 2014-06-18 DIAGNOSIS — Z23 Encounter for immunization: Secondary | ICD-10-CM

## 2014-06-18 DIAGNOSIS — E785 Hyperlipidemia, unspecified: Secondary | ICD-10-CM

## 2014-06-18 DIAGNOSIS — F17200 Nicotine dependence, unspecified, uncomplicated: Secondary | ICD-10-CM

## 2014-06-18 DIAGNOSIS — N521 Erectile dysfunction due to diseases classified elsewhere: Secondary | ICD-10-CM

## 2014-06-18 DIAGNOSIS — E1165 Type 2 diabetes mellitus with hyperglycemia: Secondary | ICD-10-CM

## 2014-06-18 DIAGNOSIS — IMO0002 Reserved for concepts with insufficient information to code with codable children: Secondary | ICD-10-CM

## 2014-06-18 DIAGNOSIS — Z6841 Body Mass Index (BMI) 40.0 and over, adult: Secondary | ICD-10-CM

## 2014-06-18 DIAGNOSIS — F17209 Nicotine dependence, unspecified, with unspecified nicotine-induced disorders: Secondary | ICD-10-CM

## 2014-06-18 MED ORDER — SILDENAFIL CITRATE 100 MG PO TABS: 50.0000 mg | ORAL_TABLET | Freq: Every day | ORAL | Status: DC | PRN

## 2014-06-18 NOTE — Patient Instructions (Addendum)
F/u in 3.5 month, call if you need me before  Blood sugar is increased, stop cakes also stop sodas   Blood pressure is excellent  You NEED to get CPAP, eye exam, and colonoscopy  You need to cut back on cigarettes with a view to quitting  Sooner rather than later    Flu vaccine today

## 2014-06-19 ENCOUNTER — Other Ambulatory Visit: Payer: Self-pay

## 2014-07-11 DIAGNOSIS — F17209 Nicotine dependence, unspecified, with unspecified nicotine-induced disorders: Secondary | ICD-10-CM | POA: Insufficient documentation

## 2014-07-11 DIAGNOSIS — Z23 Encounter for immunization: Secondary | ICD-10-CM | POA: Insufficient documentation

## 2014-07-11 NOTE — Assessment & Plan Note (Signed)
Elevated TG, low fat diet discussed and encouraged also need to take statin for stroke and CAD risk reduction Updated lab needed at/ before next visit.

## 2014-07-11 NOTE — Assessment & Plan Note (Signed)
Deteriorated. Patient re-educated about  the importance of commitment to a  minimum of 150 minutes of exercise per week. The importance of healthy food choices with portion control discussed. Encouraged to start a food diary, count calories and to consider  joining a support group. Sample diet sheets offered. Goals set by the patient for the next several months.    

## 2014-07-11 NOTE — Assessment & Plan Note (Signed)
deteriorated Patient advised to reduce carb and sweets, commit to regular physical activity, take meds as prescribed, test blood as directed, and attempt to lose weight, to improve blood sugar control.

## 2014-07-11 NOTE — Assessment & Plan Note (Addendum)
Med prescribed to assist with ED, also advised that smoking cesation and diabetic control will improve this

## 2014-07-11 NOTE — Assessment & Plan Note (Signed)
Ongoing cigarette smoking, unwilling to set a quit date at this time Patient counseled for approximately 5 minutes regarding the health risks of ongoing nicotine use, specifically all types of cancer, heart disease, stroke and respiratory failure. The options available for help with cessation ,the behavioral changes to assist the process, and the option to either gradully reduce usage  Or abruptly stop.is also discussed. Pt is also encouraged to set specific goals in number of cigarettes used daily, as well as to set a quit date.

## 2014-07-11 NOTE — Assessment & Plan Note (Signed)
Vaccine administered at visit.  

## 2014-07-11 NOTE — Progress Notes (Signed)
Subjective:    Patient ID: Dakota Mendez, male    DOB: 01-26-1961, 53 y.o.   MRN: [id number]  HPI The PT is here for follow up and re-evaluation of chronic medical conditions, medication management and review of any available recent lab and radiology data.  Preventive health is updated, specifically  Cancer screening and Immunization.   Questions or concerns regarding consultations or procedures which the PT has had in the interim are  addressed. The PT denies any adverse reactions to current medications since the last visit.  There are no new concerns.  There are no specific complaints       Review of Systems See HPI Denies recent fever or chills. Denies sinus pressure, nasal congestion, ear pain or sore throat. Denies chest congestion, productive cough or wheezing. Denies chest pains, palpitations and leg swelling Denies abdominal pain, nausea, vomiting,diarrhea or constipation.   Denies dysuria, frequency, hesitancy or incontinence. Denies joint pain, swelling and limitation in mobility. Denies headaches, seizures, numbness, or tingling. Denies depression, anxiety or insomnia. Denies skin break down or rash.        Objective:   Physical Exam BP 124/82  Pulse 110  Resp 18  Ht 6' (1.829 m)  Wt 316 lb (143.337 kg)  BMI 42.85 kg/m2  SpO2 98% Patient alert and oriented and in no cardiopulmonary distress.  HEENT: No facial asymmetry, EOMI,   oropharynx pink and moist.  Neck supple no JVD, no mass.  Chest: Clear to auscultation bilaterally.  CVS: S1, S2 no murmurs, no S3.Regular rate.  ABD: Soft non tender.   Ext: No edema  MS: Adequate ROM spine, shoulders, hips and knees.  Skin: Intact, no ulcerations or rash noted.  Psych: Good eye contact, normal affect. Memory intact not anxious or depressed appearing.  CNS: CN 2-12 intact, power,  normal throughout.no focal deficits noted.        Assessment & Plan:  Nicotine dependence with nicotine-induced  disorder Ongoing cigarette smoking, unwilling to set a quit date at this time Patient counseled for approximately 5 minutes regarding the health risks of ongoing nicotine use, specifically all types of cancer, heart disease, stroke and respiratory failure. The options available for help with cessation ,the behavioral changes to assist the process, and the option to either gradully reduce usage  Or abruptly stop.is also discussed. Pt is also encouraged to set specific goals in number of cigarettes used daily, as well as to set a quit date.   Need for prophylactic vaccination and inoculation against influenza Vaccine administered at visit.   Type 2 diabetes mellitus, uncontrolled deteriorated Patient advised to reduce carb and sweets, commit to regular physical activity, take meds as prescribed, test blood as directed, and attempt to lose weight, to improve blood sugar control.   Morbid obesity with body mass index of 40.0-44.9 in adult Deteriorated. Patient re-educated about  the importance of commitment to a  minimum of 150 minutes of exercise per week. The importance of healthy food choices with portion control discussed. Encouraged to start a food diary, count calories and to consider  joining a support group. Sample diet sheets offered. Goals set by the patient for the next several months.     Hyperlipidemia LDL goal <100 Elevated TG, low fat diet discussed and encouraged also need to take statin for stroke and CAD risk reduction Updated lab needed at/ before next visit.   ED (erectile dysfunction) Med prescribed to assist with ED, also advised that smoking cesation and diabetic control  will improve this

## 2014-07-16 ENCOUNTER — Other Ambulatory Visit: Payer: Self-pay | Admitting: Family Medicine

## 2014-07-17 ENCOUNTER — Other Ambulatory Visit: Payer: Self-pay | Admitting: Family Medicine

## 2014-07-17 ENCOUNTER — Telehealth: Payer: Self-pay | Admitting: Family Medicine

## 2014-07-17 NOTE — Telephone Encounter (Signed)
Med refilled.

## 2014-07-25 ENCOUNTER — Other Ambulatory Visit: Payer: Self-pay | Admitting: Family Medicine

## 2014-08-27 ENCOUNTER — Other Ambulatory Visit: Payer: Self-pay | Admitting: Family Medicine

## 2014-09-29 ENCOUNTER — Ambulatory Visit: Payer: Commercial Managed Care - HMO | Admitting: Family Medicine

## 2014-10-28 ENCOUNTER — Other Ambulatory Visit: Payer: Self-pay | Admitting: Family Medicine

## 2014-11-16 ENCOUNTER — Other Ambulatory Visit: Payer: Self-pay | Admitting: Family Medicine

## 2014-11-30 ENCOUNTER — Other Ambulatory Visit: Payer: Self-pay | Admitting: Family Medicine

## 2014-12-15 ENCOUNTER — Encounter: Payer: Self-pay | Admitting: Family Medicine

## 2014-12-15 ENCOUNTER — Ambulatory Visit (INDEPENDENT_AMBULATORY_CARE_PROVIDER_SITE_OTHER): Payer: Commercial Managed Care - HMO | Admitting: Family Medicine

## 2014-12-15 VITALS — BP 140/98 | HR 100 | Resp 18 | Ht 72.0 in | Wt 311.0 lb

## 2014-12-15 DIAGNOSIS — Z23 Encounter for immunization: Secondary | ICD-10-CM

## 2014-12-15 DIAGNOSIS — IMO0002 Reserved for concepts with insufficient information to code with codable children: Secondary | ICD-10-CM

## 2014-12-15 DIAGNOSIS — G4733 Obstructive sleep apnea (adult) (pediatric): Secondary | ICD-10-CM

## 2014-12-15 DIAGNOSIS — E1165 Type 2 diabetes mellitus with hyperglycemia: Secondary | ICD-10-CM | POA: Diagnosis not present

## 2014-12-15 DIAGNOSIS — E785 Hyperlipidemia, unspecified: Secondary | ICD-10-CM

## 2014-12-15 DIAGNOSIS — F17219 Nicotine dependence, cigarettes, with unspecified nicotine-induced disorders: Secondary | ICD-10-CM

## 2014-12-15 DIAGNOSIS — Z125 Encounter for screening for malignant neoplasm of prostate: Secondary | ICD-10-CM

## 2014-12-15 DIAGNOSIS — I10 Essential (primary) hypertension: Secondary | ICD-10-CM | POA: Diagnosis not present

## 2014-12-15 DIAGNOSIS — Z1211 Encounter for screening for malignant neoplasm of colon: Secondary | ICD-10-CM

## 2014-12-15 DIAGNOSIS — N528 Other male erectile dysfunction: Secondary | ICD-10-CM

## 2014-12-15 DIAGNOSIS — Z6841 Body Mass Index (BMI) 40.0 and over, adult: Secondary | ICD-10-CM

## 2014-12-15 MED ORDER — GLIPIZIDE ER 10 MG PO TB24: ORAL_TABLET | ORAL | Status: DC

## 2014-12-15 NOTE — Patient Instructions (Addendum)
Annual wellness in early September, call if you need me before  Fasting labs this week  It is important that you exercise regularly at least 30 minutes 5 times a week. If you develop chest pain, have severe difficulty breathing, or feel very tired, stop exercising immediately and seek medical attention    A healthy diet is rich in fruit, vegetables and whole grains. Poultry fish, nuts and beans are a healthy choice for protein rather then red meat. A low sodium diet and drinking 64 ounces of water daily is generally recommended. Oils and sweet should be limited. Carbohydrates especially for those who are diabetic or overweight, should be limited to 60-45 gram per meal. It is important to eat on a regular schedule, at least 3 times daily. Snacks should be primarily fruits, vegetables or nuts.  Need to check into CPAP cost with your ins  Weight goal of 299 pounds or less in 4 months  Need to quit nicotine, current is 15 per day  You will be referred for May, June and July specialist appts which you need all 3 as we discussed  Need to take BP meds same time every day   Prevnar today

## 2014-12-23 DIAGNOSIS — E785 Hyperlipidemia, unspecified: Secondary | ICD-10-CM | POA: Diagnosis not present

## 2014-12-23 DIAGNOSIS — E119 Type 2 diabetes mellitus without complications: Secondary | ICD-10-CM | POA: Diagnosis not present

## 2014-12-23 DIAGNOSIS — E1165 Type 2 diabetes mellitus with hyperglycemia: Secondary | ICD-10-CM | POA: Diagnosis not present

## 2014-12-23 DIAGNOSIS — I10 Essential (primary) hypertension: Secondary | ICD-10-CM | POA: Diagnosis not present

## 2014-12-24 LAB — COMPLETE METABOLIC PANEL WITH GFR
ALK PHOS: 63 U/L (ref 39–117)
ALT: 14 U/L (ref 0–53)
AST: 13 U/L (ref 0–37)
Albumin: 4 g/dL (ref 3.5–5.2)
BILIRUBIN TOTAL: 0.8 mg/dL (ref 0.2–1.2)
BUN: 15 mg/dL (ref 6–23)
CO2: 29 mEq/L (ref 19–32)
CREATININE: 0.99 mg/dL (ref 0.50–1.35)
Calcium: 9.6 mg/dL (ref 8.4–10.5)
Chloride: 101 mEq/L (ref 96–112)
GFR, EST NON AFRICAN AMERICAN: 88 mL/min
GLUCOSE: 131 mg/dL — AB (ref 70–99)
Potassium: 4.3 mEq/L (ref 3.5–5.3)
Sodium: 139 mEq/L (ref 135–145)
TOTAL PROTEIN: 7 g/dL (ref 6.0–8.3)

## 2014-12-24 LAB — TSH: TSH: 4.315 u[IU]/mL (ref 0.350–4.500)

## 2014-12-24 LAB — LIPID PANEL
CHOLESTEROL: 168 mg/dL (ref 0–200)
HDL: 40 mg/dL (ref >=40)
LDL Cholesterol: 89 mg/dL (ref 0–99)
Total CHOL/HDL Ratio: 4.2 Ratio
Triglycerides: 197 mg/dL — ABNORMAL HIGH (ref <150)
VLDL: 39 mg/dL (ref 0–40)

## 2014-12-24 LAB — HEMOGLOBIN A1C
Hgb A1c MFr Bld: 6.9 % — ABNORMAL HIGH (ref <5.7)
Mean Plasma Glucose: 151 mg/dL — ABNORMAL HIGH (ref <117)

## 2015-01-03 NOTE — Op Note (Signed)
PATIENT NAME:  Dakota Mendez, Dakota Mendez MR#:  [id number] DATE OF BIRTH:  [birth year]  DATE OF PROCEDURE:  09/21/2011  PREOPERATIVE DIAGNOSIS: Cervical spondylosis with radiculopathy.   POSTOPERATIVE DIAGNOSIS: Cervical spondylosis with radiculopathy.   PROCEDURES:  1. Anterior cervical diskectomy and fusion with removal of posterior osteophytes, C5-C6.  2. Anterior cervical fusion with diskectomy, removal of posterior osteophytes, C6-C7.  3. Insertion of interbody device, C5-C6.  4. Insertion of interbody device, Q2-I2.  5. Application of segmental anterior plate, C5-C6, C6-C7.   SURGEON: Alysia Penna. Mauri Pole, MD  ASSISTANT: Dorthula Matas PA-C.   ANESTHESIA: General.   ESTIMATED BLOOD LOSS: 75 mL.  REPLACEMENT: 1200 mL of crystalloid.   DRAINS: One Hemovac.   COMPLICATIONS: None.   IMPLANTS USED: Zimmer 44 mm anterior Trinica plate, angled trabecular metal, 7 mm at C6 7 and 6-mm at C5-C6, CopiOs bone void filler.   BRIEF CLINICAL NOTE AND PATHOLOGY: The patient had persistent radicular pain with weakness down his right arm, minimal left arm symptoms. Work-up showed evidence of significant lateral stenosis, disk protrusion at C5-C6 and C6-C7. There was no evidence of other pathology except for some mild changes at C4-C5. Options, risks, and benefits were discussed. At the time of the procedure there was a significant amount of lateral stenosis with disk protrusion particularly at C5-C6.   DESCRIPTION OF PROCEDURE: Preop antibiotics, adequate general anesthesia, supine position, all prominences well padded. Head traction used, roll placed beneath the neck. Routine prepping and draping. Prior to the prepping the position was marked using fluoroscopic guidance.   A routine left side incision was made. The interval was developed down to the anterior cervical spine. The trachea and esophagus were carefully mobilized to the right side. Anterior aspect of the cervical spine was cleaned of soft tissue.  Lateral fluoroscopy used to confirm position, self-retaining retractor was placed. Distraction pins placed in C6 and C7 using fluoroscopic guidance. Distraction was applied. Microscope was brought onto the field. The anterior annulus was excised, diskectomy was performed. Posterior osteophytes were removed as was the posterior longitudinal ligament using the 45 degree Kerrison. Decompression was performed. Endplate was scraped and smoothed. Sizing was performed. The 7 mm gave excellent purchase. The rasp was used. The area was again thoroughly irrigated and posterior aspect reinspected. Small amount of Surgiflo was used. The implant was then inserted. It seated very nicely and was extremely stable.   Attention then turned to C5-C6 where the same procedure was performed. Posterior osteophytes were removed. There was a large amount of disk material on the right side.   Again the endplates were cleaned, sizing performed and implant inserted.   AP and lateral views showed good position of the interbody devices.   All distraction was removed, incision thoroughly irrigated. Anterior osteophytes were removed and the anterior plate was then applied using fluoroscopic guidance. All screws had excellent purchase. Variable screws placed at C5 and C6, fixed screws at C7, 14 mm. Locking mechanisms were engaged. AP and lateral views showed good positioning. Hemostasis was good. Incision was thoroughly irrigated. Surgiflo was applied. Platysmas reapproximated followed by sub-Q closure with 2-0 Vicryl and 4-0 Vicryl. Skin glue was used. Soft sterile dressing was applied. Sponge and needle counts were correct prior to and after wound closure. Patient was awakened, taken to the PAC-U having tolerated procedure well in a soft cervical collar.  ____________________________ Alysia Penna. Mauri Pole, MD jcc:cms D: 09/22/2011 07:08:10 ET T: 09/22/2011 10:16:06 ET  JOB#: [id number] cc: Alysia Penna. Mauri Pole, MD, <Dictator> Uriah Philipson C  Bill Mcvey  MD ELECTRONICALLY SIGNED 09/28/2011 16:06

## 2015-01-17 DIAGNOSIS — Z23 Encounter for immunization: Secondary | ICD-10-CM | POA: Insufficient documentation

## 2015-01-17 NOTE — Progress Notes (Signed)
   Subjective:    Patient ID: Dakota Mendez, male    DOB: [birth year], 54 y.o.   MRN: [id number]  HPI    Review of Systems     Objective:   Physical Exam        Assessment & Plan:

## 2015-01-17 NOTE — Progress Notes (Signed)
Dakota Mendez     MRN: [id number]      DOB: 08/06/1961   HPI Dakota Mendez is here for follow up and re-evaluation of chronic medical conditions, medication management and review of any available recent lab and radiology data.  Preventive health is updated, specifically  Cancer screening and Immunization.    The PT denies any adverse reactions to current medications since the last visit. Has not been taking bP med  On regular basis and BP is uncontrolled There are no new concerns.  There are no specific complaints  Denies polyuria, polydipsia, blurred vision , or hypoglycemic episodes.   ROS Denies recent fever or chills. Denies sinus pressure, nasal congestion, ear pain or sore throat. Denies chest congestion, productive cough or wheezing. Denies chest pains, palpitations and leg swelling Denies abdominal pain, nausea, vomiting,diarrhea or constipation.   Denies dysuria, frequency, hesitancy or incontinence. Denies joint pain, swelling and limitation in mobility. Denies headaches, seizures, numbness, or tingling. Denies depression, anxiety or insomnia. Denies skin break down or rash.   PE  BP 140/98 mmHg  Pulse 100  Resp 18  Ht 6' (1.829 m)  Wt 311 lb 0.6 oz (141.087 kg)  BMI 42.18 kg/m2  SpO2 97%  Patient alert and oriented and in no cardiopulmonary distress.  HEENT: No facial asymmetry, EOMI,   oropharynx pink and moist.  Neck supple no JVD, no mass.  Chest: Clear to auscultation bilaterally.  CVS: S1, S2 no murmurs, no S3.Regular rate.  ABD: Soft non tender.   Ext: No edema  MS: Adequate ROM spine, shoulders, hips and knees.  Skin: Intact, no ulcerations or rash noted.  Psych: Good eye contact, normal affect. Memory intact not anxious or depressed appearing.  CNS: CN 2-12 intact, power,  normal throughout.no focal deficits noted.   Assessment & Plan   Essential hypertension Uncontrolled, non compliant. DASH diet and commitment to daily physical  activity for a minimum of 30 minutes discussed and encouraged, as a part of hypertension management. The importance of attaining a healthy weight is also discussed.  BP/Weight 12/15/2014 06/18/2014 01/15/2014 12/11/2013 09/16/2013 08/04/2013 92/11/3005  Systolic BP 622 633 354 562 563 893 -  Diastolic BP 98 82 94 78 82 90 -  Wt. (Lbs) 311.04 316 314 320 315 316.6 310  BMI 42.18 42.85 42.58 43.39 42.71 42.93 42.03          Obstructive sleep apnea States unable to afford equipment , encouraged o check with ins company re coverage for same, he importance of using the equipment is stressed Will need re evaluation by pulmonary as he has never used equipment and was last seen in 2014, will refer for July eval per his request   Nicotine dependence with nicotine-induced disorder Deteriorated Patient counseled for approximately 5 minutes regarding the health risks of ongoing nicotine use, specifically all types of cancer, heart disease, stroke and respiratory failure. The options available for help with cessation ,the behavioral changes to assist the process, and the option to either gradully reduce usage  Or abruptly stop.is also discussed. Pt is also encouraged to set specific goals in number of cigarettes used daily, as well as to set a quit date.  Number of cigarettes/cigars currently smoking daily: 15    Morbid obesity with body mass index of 40.0-44.9 in adult Unchanfed. Patient re-educated about  the importance of commitment to a  minimum of 150 minutes of exercise per week.  The importance of healthy food choices with portion control discussed.  Encouraged to start a food diary, count calories and to consider  joining a support group. Sample diet sheets offered. Goals set by the patient for the next several months.   Weight /BMI 12/15/2014 06/18/2014 01/15/2014  WEIGHT 311 lb 0.6 oz 316 lb 314 lb  HEIGHT 6\' 0"  6\' 0"  -  BMI 42.18 kg/m2 42.85 kg/m2 42.58 kg/m2    Current exercise per week  60 minutes.    Hyperlipidemia LDL goal <100 Improved, TG remain elevated Hyperlipidemia:Low fat diet discussed and encouraged.   Lipid Panel  Lab Results  Component Value Date   CHOL 168 12/23/2014   HDL 40 12/23/2014   LDLCALC 89 12/23/2014   TRIG 197* 12/23/2014   CHOLHDL 4.2 12/23/2014         Need for vaccination with 13-polyvalent pneumococcal conjugate vaccine After obtaining informed consent, the vaccine is  administered by LPN.    ED (erectile dysfunction) Cost of medication remains a challenge, has difficulty with both attainaing anf maintaining erections. Nicotine cessation and weight loss encouraged

## 2015-01-17 NOTE — Assessment & Plan Note (Signed)
After obtaining informed consent, the vaccine is  administered by LPN.  

## 2015-01-17 NOTE — Assessment & Plan Note (Signed)
Uncontrolled, non compliant. DASH diet and commitment to daily physical activity for a minimum of 30 minutes discussed and encouraged, as a part of hypertension management. The importance of attaining a healthy weight is also discussed.  BP/Weight 12/15/2014 06/18/2014 01/15/2014 12/11/2013 09/16/2013 08/04/2013 28/10/599  Systolic BP 561 537 943 276 147 092 -  Diastolic BP 98 82 94 78 82 90 -  Wt. (Lbs) 311.04 316 314 320 315 316.6 310  BMI 42.18 42.85 42.58 43.39 42.71 42.93 42.03

## 2015-01-17 NOTE — Assessment & Plan Note (Signed)
Cost of medication remains a challenge, has difficulty with both attainaing anf maintaining erections. Nicotine cessation and weight loss encouraged

## 2015-01-17 NOTE — Assessment & Plan Note (Signed)
Deteriorated Patient counseled for approximately 5 minutes regarding the health risks of ongoing nicotine use, specifically all types of cancer, heart disease, stroke and respiratory failure. The options available for help with cessation ,the behavioral changes to assist the process, and the option to either gradully reduce usage  Or abruptly stop.is also discussed. Pt is also encouraged to set specific goals in number of cigarettes used daily, as well as to set a quit date.  Number of cigarettes/cigars currently smoking daily: 15

## 2015-01-17 NOTE — Assessment & Plan Note (Addendum)
States unable to afford equipment , encouraged o check with ins company re coverage for same, he importance of using the equipment is stressed Will need re evaluation by pulmonary as he has never used equipment and was last seen in 2014, will refer for July eval per his request

## 2015-01-17 NOTE — Assessment & Plan Note (Signed)
Improved, TG remain elevated Hyperlipidemia:Low fat diet discussed and encouraged.   Lipid Panel  Lab Results  Component Value Date   CHOL 168 12/23/2014   HDL 40 12/23/2014   LDLCALC 89 12/23/2014   TRIG 197* 12/23/2014   CHOLHDL 4.2 12/23/2014

## 2015-01-17 NOTE — Assessment & Plan Note (Signed)
[place]. Patient re-educated about  the importance of commitment to a  minimum of 150 minutes of exercise per week.  The importance of healthy food choices with portion control discussed. Encouraged to start a food diary, count calories and to consider  joining a support group. Sample diet sheets offered. Goals set by the patient for the next several months.   Weight /BMI 12/15/2014 06/18/2014 01/15/2014  WEIGHT 311 lb 0.6 oz 316 lb 314 lb  HEIGHT 6\' 0"  6\' 0"  -  BMI 42.18 kg/m2 42.85 kg/m2 42.58 kg/m2    Current exercise per week 60 minutes.

## 2015-01-18 ENCOUNTER — Encounter (INDEPENDENT_AMBULATORY_CARE_PROVIDER_SITE_OTHER): Payer: Self-pay | Admitting: *Deleted

## 2015-02-01 ENCOUNTER — Other Ambulatory Visit: Payer: Self-pay | Admitting: Family Medicine

## 2015-02-09 ENCOUNTER — Telehealth: Payer: Self-pay | Admitting: Family Medicine

## 2015-02-09 DIAGNOSIS — N528 Other male erectile dysfunction: Secondary | ICD-10-CM

## 2015-02-09 NOTE — Telephone Encounter (Signed)
Referral entered  

## 2015-02-09 NOTE — Telephone Encounter (Signed)
Called and left message for patient to return call.  

## 2015-02-09 NOTE — Telephone Encounter (Signed)
Patient is requesting Andro Gel.  Is aware that this is to be prescribed by a urologist.  Is it ok to place referral?

## 2015-02-09 NOTE — Telephone Encounter (Signed)
Yes pls refer for male erectile dysfunction

## 2015-02-09 NOTE — Addendum Note (Signed)
Addended by: Denman George B on: 02/09/2015 04:50 PM   Modules accepted: Orders

## 2015-02-10 ENCOUNTER — Encounter: Payer: Self-pay | Admitting: Family Medicine

## 2015-02-15 ENCOUNTER — Other Ambulatory Visit: Payer: Self-pay | Admitting: *Deleted

## 2015-02-15 ENCOUNTER — Other Ambulatory Visit: Payer: Self-pay | Admitting: Family Medicine

## 2015-02-15 DIAGNOSIS — F17200 Nicotine dependence, unspecified, uncomplicated: Secondary | ICD-10-CM

## 2015-02-15 DIAGNOSIS — E669 Obesity, unspecified: Secondary | ICD-10-CM

## 2015-02-15 DIAGNOSIS — I10 Essential (primary) hypertension: Secondary | ICD-10-CM

## 2015-02-15 DIAGNOSIS — E1169 Type 2 diabetes mellitus with other specified complication: Secondary | ICD-10-CM

## 2015-02-15 DIAGNOSIS — E119 Type 2 diabetes mellitus without complications: Secondary | ICD-10-CM

## 2015-02-15 DIAGNOSIS — N529 Male erectile dysfunction, unspecified: Secondary | ICD-10-CM

## 2015-02-15 DIAGNOSIS — G4733 Obstructive sleep apnea (adult) (pediatric): Secondary | ICD-10-CM

## 2015-02-15 NOTE — Patient Outreach (Signed)
[facility]) Care Management  02/15/2015  Dakota Mendez 31-Oct-1960 [id number]   MD referral:  Telephone call to patient; left message on voice mail requesting call back.  Patient returned call. HIPPA verification received. Patient voices that he recently moved and gave physical address: [address], [place], [place] [address]. Advised patient of reason for call. Patient voices that his current medical conditions include diabetes, hypertension, high cholesterol, chronic neck & back pain, sleep apnea, and is overweight. States he is  unable to afford equipment for CPAP and over the counter medications that are not covered by insurance.  States he has blood pressure monitor and glucose meter that are functioning. Checks blood sugar once a week. States he is not using any assistance to walk but due to neck injury has limited mobility of right arm and sometimes right leg not as mobile. States not exercising regular. Advised patient to call insurance customer service to inquire about [employer] program. States he will call.   Patient states he smokes 1 pack of cigarettes daily and wants to stop but has been unable to afford patches. Also states he has had sleep study done but not obtained CPAP machine due to monthly cost quoted.  States currently getting monthly medication refills. States he is getting 1 check monthly and is not on Medicaid.    Recommendations for [facility] community care coordination for health assessment due to multiple diagnosis, falls risk ; also recommend clinical social work referral for consult for psychosocial concerns-finances.  Patient consents to services of [facility] care management.  Plan: Referral to [facility] community care coordinator and Clinical social worker.  Sherrin Daisy, RN BSN CCM Care Management Coordinator [facility] Care Management  [phone]  .

## 2015-02-15 NOTE — Patient Outreach (Signed)
[place] [facility]) Care Management  02/15/2015  EVAAN TIDWELL 09/10/1961 [id number]   Request from Sherrin Daisy, RN to assign SW and Community RN to patient, assigned Theadore Nan, LCSW and Burgess Amor, RN.  Ronnell Freshwater. [place], [place] Management [place] Assistant Phone: [phone] Fax: [phone]

## 2015-02-17 ENCOUNTER — Other Ambulatory Visit: Payer: Self-pay | Admitting: *Deleted

## 2015-02-17 ENCOUNTER — Other Ambulatory Visit: Payer: Self-pay | Admitting: Licensed Clinical Social Worker

## 2015-02-17 DIAGNOSIS — H524 Presbyopia: Secondary | ICD-10-CM | POA: Diagnosis not present

## 2015-02-17 DIAGNOSIS — H5210 Myopia, unspecified eye: Secondary | ICD-10-CM | POA: Diagnosis not present

## 2015-02-17 LAB — HM DIABETES EYE EXAM

## 2015-02-17 NOTE — Patient Outreach (Signed)
Call to patient regarding referral to [facility] for care management services. Patient agreeable to [facility] program. Scheduled initial visit Plan initial outreach visit 03/03/15 Dakota Mendez. Laymond Purser, RN, BSN, [place] [phone]

## 2015-02-17 NOTE — Patient Outreach (Signed)
  Assessment:  CSW received referral on Dakota Mendez .  CSW completed chart review on client on 02/17/15.  CSW called client phone number on 02/17/15 and spoke via phone with client on 02/17/15. CSW verified identity of client. Client gave CSW verbal permission to speak with client about current medical needs and status of client.  CSW introduced self and described that Dr. Moshe Mendez had made referral of client to [facility] program support. CSW and client spoke of [facility] consent form completion. CSW asked Dakota Mendez on 02/17/15 to please mail [facility] consent and return envelope to client. Dakota Mendez said he would mail [facility] consent and return envelope to client on 02/18/15.  CSW informed client on 02/17/15 that [facility] consent form (blank) and return envelope would be mailed to him. He agreed to this plan and said he would review [facility] consent form. Client said he has ongoing back and neck pain.  He said he has completed a sleep study recently. He said he had spoken via phone with RN Dakota Mendez on 02/17/15.  He said he was glad he was referred to CSW and RN with [facility] program for support.  CSW and client completed needed [facility] assessments on client. Client said he has been trying to stop smoking. He said he had "used the patches before and has used chantix before."  He said he could not currently afford to buy the patches and that is why he is not using them presently. CSW spoke with client about smoking cessation support through [place] program.  CSW explained to client that [facility] program support was free to client since he saw Dr. Moshe Mendez as primary doctor.  Client plans to talk further with [facility] RN Dakota Mendez first about his medications.  He is aware of [facility] pharmacy program support related to smoking cessation.  He said he was only on a small number of medications and would talk first with Dakota Mendez about medications being used.  He said he can walk without assistance; however, he said when he turns to the right he sometimes become  a little dizzy. He said he was not using a cane and not using a walker at present.  He has a little difficulty preparing meals and said he has to stand in certain way to be able to prepare meals safely. CSW gave [facility] CSW number of [phone] and encouraged client to call CSW as needed to address clinical social work needs of client.  CSW thanked Dakota Mendez for phone conversation on 02/17/15. Dakota Mendez was appreciative of call and said he looked forward to working with [facility] staff .  Plan: CSW communicated above information to RN Dakota Mendez  on 02/17/15. CSW to call client in two weeks to assess needs of client at that time. Client to communicate with RN Dakota Mendez to discuss nursing needs of client.  Dakota Mendez.Dakota Mendez MSW, LCSW Licensed Clinical Social Worker [facility] Care Management [phone]

## 2015-03-03 ENCOUNTER — Ambulatory Visit: Payer: Self-pay | Admitting: *Deleted

## 2015-03-04 ENCOUNTER — Other Ambulatory Visit: Payer: Self-pay | Admitting: *Deleted

## 2015-03-04 NOTE — Patient Outreach (Signed)
[facility]) Care Management   03/04/2015  Dakota Mendez Jan 06, 1961 [id number]  Dakota Mendez is an 54 y.o. male  Subjective:  Patient stated Goals: "I want to lose some weight", "I want to quit smoking" Patient has BP cuff, will monitor BP more often. Patient does not check his blood sugars, reporting he is out of strips and they are expensive.  Patient has appointment scheduled for urology to follow up on ED issues. Patient has appointment with pulmonology to follow up on his sleep apnea.  Patient states he has been on disability since he was in his 61's, he states he has a neck injury from a work related accident, has had surgery a couple of years ago, still has pain and weakness on right side, uses tylenol for pain.    Objective:    BP 132/92 mmHg  Pulse 108  Resp 20  Ht 1.829 m (6')  Wt 300 lb (136.079 kg)  BMI 40.68 kg/m2  SpO2 96%   Review of Systems  Constitutional: Negative.   Eyes: Negative.   Respiratory: Negative.   Cardiovascular: Negative.   Gastrointestinal: Negative.   Genitourinary: Negative.   Musculoskeletal: Positive for myalgias, back pain, joint pain and neck pain.  Skin: Negative.   Neurological: Negative.   Endo/Heme/Allergies: Negative.   Psychiatric/Behavioral: Negative.     Physical Exam  Constitutional: He is oriented to person, place, and time. He appears well-developed.  Neck: Normal range of motion.  Cardiovascular: Normal rate.   Respiratory: Effort normal and breath sounds normal.  GI: Soft.  Musculoskeletal: Normal range of motion.  Neurological: He is alert and oriented to person, place, and time.  Skin: Skin is dry.  Psychiatric: He has a normal mood and affect. His behavior is normal.    Current Medications:   Current Outpatient Prescriptions  Medication Sig Dispense Refill  . amLODipine (NORVASC) 10 MG tablet TAKE ONE TABLET BY MOUTH ONCE DAILY 30 tablet 2  . glipiZIDE (GLUCOTROL XL) 10 MG 24 hr tablet  TAKE ONE TABLET BY MOUTH ONCE DAILY WITH BREAKFAST 60 tablet 1  . glucose blood (ONE TOUCH ULTRA TEST) test strip Use as instructed for once daily testing  Dx 250.00 length of need- 99 months 100 each 11  . losartan-hydrochlorothiazide (HYZAAR) 100-25 MG per tablet TAKE ONE TABLET BY MOUTH ONCE DAILY 30 tablet 2  . metFORMIN (GLUCOPHAGE) 1000 MG tablet TAKE ONE TABLET BY MOUTH TWICE DAILY WITH MEALS 60 tablet 4  . ONETOUCH DELICA LANCETS 74J MISC 1 each by Does not apply route daily. For once daily testing DX 250.00 Length of need- 99 months 100 each 11  . pravastatin (PRAVACHOL) 20 MG tablet TAKE ONE TABLET BY MOUTH ONCE DAILY 30 tablet 4  . sildenafil (VIAGRA) 100 MG tablet Take 0.5-1 tablets (50-100 mg total) by mouth daily as needed for erectile dysfunction. 5 tablet 0   No current facility-administered medications for this visit.    Functional Status:   In your present state of health, do you have any difficulty performing the following activities: 02/17/2015 02/15/2015  Hearing? - N  Vision? - N  Difficulty concentrating or making decisions? - N  Walking or climbing stairs? - Y  Dressing or bathing? N (No Data)  Doing errands, shopping? N N  Preparing Food and eating ? N -  Using the Toilet? N -  In the past six months, have you accidently leaked urine? N -  Do you have problems with loss of bowel  control? N -  Managing your Medications? N -  Managing your Finances? N -  Housekeeping or managing your Housekeeping? N -    Fall/Depression Screening:    PHQ 2/9 Scores 02/17/2015 06/18/2014 04/18/2013  PHQ - 2 Score 0 0 0  PHQ- 9 Score - 1 -    Assessment:  Gave patient meal plans for Males with Diabetes from the [facility], reviewed tips for weight loss. Patient to cut back on soda from 3 to 1 a day. Patient to increase exercise by walking 15 minutes daily  Patient to get rx for CBG test strips and start checking CBGs, reviewed benefit for blood glucose  monitoring. Patient will check BP at least once a week and log  Plan:  Bring blue THN calendar at next visit Visit again in July Refer to Pharmacy for smoking cessation  Royetta Crochet. Laymond Purser, RN, BSN, [place] [phone] [facility] CM Care Plan Problem One        Patient Outreach Telephone from 02/17/2015 in [place] Problem One  Client needs assistance in affording his prescribed medications   Care Plan for Problem One  Active   THN Long Term Goal (31-90 days)  Client will be able to afford his prescribed medications for next 60 days   THN Long Term Goal Start Date  02/17/15   Interventions for Problem One Long Term Goal  CSW and client spoke of client's monthly medication costs. CSW informed client that [place] program may be able to talk further with client about  monthly medication costs for client and managing such costs.   THN CM Short Term Goal #1 (0-30 days)  Client will be able to afford his prescribed medications for next 30 days   THN CM Short Term Goal #1 Start Date  02/17/15   Interventions for Short Term Goal #1  CSW talked with client about [facility] pharmacy program possible support. Client said he would talk first to RN Burgess Amor about medication he takes each month.    [facility] CM Care Plan Problem Two        Patient Outreach from 03/04/2015 in [place] Problem Two  Obesity AEB BMI 36.7   Care Plan for Problem Two  Active   Interventions for Problem Two Long Term Goal   Using teachback method, Diabetic meal plans for males given to patient from [facility].   THN Long Term Goal (31-90) days  Patient would like to lose 20 pounds in next 90 days   THN Long Term Goal Start Date  03/04/15   THN CM Short Term Goal #1 (0-30 days)  Patient will cut back on sugar laden soda from 3 to 1 daily over the next 30 days   THN CM Short Term Goal #1 Start Date  03/04/15   Interventions for Short Term  Goal #2   Using teachback method, reviewed current diet and discussed simple changes patient could make to assist with weight loss   THN CM Short Term Goal #2 (0-30 days)  Patient will increase daily exercise and walk 15 minutes over the next 30 days   THN CM Short Term Goal #2 Start Date  03/04/15   Interventions for Short Term Goal #2  Using teachback method, reviewed current exercise plan and ways to increase exercise and endurance

## 2015-03-05 ENCOUNTER — Encounter: Payer: Self-pay | Admitting: *Deleted

## 2015-03-05 DIAGNOSIS — E119 Type 2 diabetes mellitus without complications: Secondary | ICD-10-CM

## 2015-03-05 NOTE — Patient Outreach (Signed)
[place] [facility]) Care Management  03/05/2015  Dakota Mendez August 05, 1961 [id number]   Request from Burgess Amor, RN to assign Pharmacy,  [place], PharmD and Nicoletta Ba, PharmD Resident.  Dakota Mendez. [place], [place] Management [place] Assistant Phone: [phone] Fax: [phone]

## 2015-03-08 ENCOUNTER — Other Ambulatory Visit: Payer: Self-pay

## 2015-03-11 ENCOUNTER — Other Ambulatory Visit: Payer: Self-pay | Admitting: *Deleted

## 2015-03-11 NOTE — Patient Outreach (Addendum)
[place] [facility]) Care Management  03/11/2015  DAYVEN LINSLEY Aug 26, 1961 [id number]   RNCM quick outreach visit on 03/10/15 to deliver [place] for patient to record weights and BP and to  Keep up with his upcoming appointments. During quick visit patient asking about dental services. RNCM will refer patient to [place], who is working with patient also for [employer]. Plan to continue [facility] services. Plan visit for July.  Royetta Crochet. Laymond Purser, RN, BSN, [place] [phone]

## 2015-03-16 ENCOUNTER — Ambulatory Visit: Payer: Commercial Managed Care - HMO | Admitting: Internal Medicine

## 2015-03-16 ENCOUNTER — Institutional Professional Consult (permissible substitution): Payer: Commercial Managed Care - HMO | Admitting: Pulmonary Disease

## 2015-04-01 ENCOUNTER — Encounter: Payer: Self-pay | Admitting: *Deleted

## 2015-04-01 ENCOUNTER — Other Ambulatory Visit: Payer: Self-pay | Admitting: *Deleted

## 2015-04-01 ENCOUNTER — Other Ambulatory Visit: Payer: Self-pay | Admitting: Licensed Clinical Social Worker

## 2015-04-01 NOTE — Patient Outreach (Signed)
[facility]) Care Management   04/01/2015  Dakota Mendez Sep 27, 1960 [id number]  Dakota Mendez is an 54 y.o. male  Subjective:  Patient reports he is doing pretty good, he went fishing this am before it became too hot. Patient has not been checking his blood sugars or BP.  He reports he does not have any strips and they cost too much to buy. Patient does verbalize he would like to stop smoking and has tried chantix in past and does not want to use that but would be open to alternative. Patient also needs some dental work, needs to find low cost dental program if any available. Patient reports he has cut back on his soda's.  Still walking some and wants to lose some weight before his appointment with Dr. Lodema Hong in September.  Objective:   BP 128/88 mmHg  Pulse 108  Resp 20  SpO2 98% Review of Systems  Constitutional: Negative.   HENT: Negative.   Eyes: Negative.   Respiratory: Negative.   Cardiovascular: Negative.   Gastrointestinal: Negative.   Genitourinary: Negative.   Musculoskeletal: Negative.   Skin: Negative.   Neurological: Negative.   Endo/Heme/Allergies: Negative.   Psychiatric/Behavioral: Negative.     Physical Exam  Constitutional: He is oriented to person, place, and time. He appears well-developed and well-nourished.  Neck: Normal range of motion.  Cardiovascular: Normal rate.   Respiratory: Effort normal and breath sounds normal.  GI: Soft.  Neurological: He is alert and oriented to person, place, and time.  Skin: Skin is warm and dry.    Current Medications:   Current Outpatient Prescriptions  Medication Sig Dispense Refill  . amLODipine (NORVASC) 10 MG tablet TAKE ONE TABLET BY MOUTH ONCE DAILY 30 tablet 2  . glipiZIDE (GLUCOTROL XL) 10 MG 24 hr tablet TAKE ONE TABLET BY MOUTH ONCE DAILY WITH BREAKFAST 60 tablet 1  . glucose blood (ONE TOUCH ULTRA TEST) test strip Use as instructed for once daily testing  Dx 250.00 length of  need- 99 months 100 each 11  . losartan-hydrochlorothiazide (HYZAAR) 100-25 MG per tablet TAKE ONE TABLET BY MOUTH ONCE DAILY 30 tablet 2  . metFORMIN (GLUCOPHAGE) 1000 MG tablet TAKE ONE TABLET BY MOUTH TWICE DAILY WITH MEALS 60 tablet 4  . ONETOUCH DELICA LANCETS 33G MISC 1 each by Does not apply route daily. For once daily testing DX 250.00 Length of need- 99 months 100 each 11  . pravastatin (PRAVACHOL) 20 MG tablet TAKE ONE TABLET BY MOUTH ONCE DAILY 30 tablet 4  . sildenafil (VIAGRA) 100 MG tablet Take 0.5-1 tablets (50-100 mg total) by mouth daily as needed for erectile dysfunction. 5 tablet 0   No current facility-administered medications for this visit.    Functional Status:   In your present state of health, do you have any difficulty performing the following activities: 04/01/2015 02/17/2015  Hearing? N -  Vision? Y -  Difficulty concentrating or making decisions? N -  Walking or climbing stairs? N -  Dressing or bathing? N N  Doing errands, shopping? N N  Preparing Food and eating ? - N  Using the Toilet? - N  In the past six months, have you accidently leaked urine? - N  Do you have problems with loss of bowel control? - N  Managing your Medications? - N  Managing your Finances? - N  Housekeeping or managing your Housekeeping? - N    Fall/Depression Screening:    Fall Risk  02/17/2015 04/18/2013  Falls in the past year? No No  Risk for fall due to : Impaired mobility;Impaired balance/gait -   PHQ 2/9 Scores 02/17/2015 06/18/2014 04/18/2013  PHQ - 2 Score 0 0 0  PHQ- 9 Score - 1 -    Assessment:   Needs strips for blood sugar meter, patient having trouble affording Needs to monitor blood sugars and blood pressures.  Encouraged patient to continue in endeavor to lose his "20 pounds"   Plan:  Follow up on referral to pharmacist for smoking cessation program, and also see if the can trouble shoot the cost of patient  blood sugar supplies Follow up with [place],  Social worker on low cost dental and CPAP cost assistance. Discharge visit next month to health coach for diabetes education Route note to MD   Royetta Crochet. Laymond Purser, RN, BSN, [place] [phone]  [facility] CM Care Plan Problem One        Patient Outreach Telephone from 02/17/2015 in [place] Problem One  Client needs assistance in affording his prescribed medications   Care Plan for Problem One  Active   THN Long Term Goal (31-90 days)  Client will be able to afford his prescribed medications for next 60 days   THN Long Term Goal Start Date  02/17/15   Interventions for Problem One Long Term Goal  CSW and client spoke of client's monthly medication costs. CSW informed client that [place] program may be able to talk further with client about  monthly medication costs for client and managing such costs.   THN CM Short Term Goal #1 (0-30 days)  Client will be able to afford his prescribed medications for next 30 days   THN CM Short Term Goal #1 Start Date  02/17/15   Interventions for Short Term Goal #1  CSW talked with client about [facility] pharmacy program possible support. Client said he would talk first to RN Burgess Amor about medication he takes each month.    [facility] CM Care Plan Problem Two        Patient Outreach from 04/01/2015 in [place] Problem Two  Obesity AEB BMI 36.7   Care Plan for Problem Two  Active   Interventions for Problem Two Long Term Goal   Using teachback method, reviewed diet and exercise plan with patient encouraged in endeavor to lose some weight.   THN Long Term Goal (31-90) days  Patient stated goal " Iwould like to lose 20 pounds" in next 90 days   THN Long Term Goal Start Date  03/04/15   THN CM Short Term Goal #1 (0-30 days)  Patient will cut back on sugar laden soda from 3 to 1 daily over the next 30 days [patient has cut back on his soda consumption]   THN CM Short Term Goal #1  Start Date  03/04/15   [facility] CM Short Term Goal #1 Met Date   04/01/15   Interventions for Short Term Goal #2   Using teachback method re-inforced importance of diet and exercise to weight loss goal   THN CM Short Term Goal #2 (0-30 days)  Patient will increase daily exercise and walk 15 minutes over the next 30 days   THN CM Short Term Goal #2 Start Date  03/04/15   Interventions for Short Term Goal #2  Using teachback method reviewed patient exercise plan

## 2015-04-01 NOTE — Patient Outreach (Signed)
Assessment: CSW completed chart review on client on 04/01/15.  RN Dakota Mendez requested of CSW that [place] talk with client about low cost dental services resource in area and also talk with client about equipment need of client for CPAP machine.   CSW  called client home phone number on 04/01/15. CSW verified identity of client.  CSW and client spoke of smoking cessation for client. Client said he hopes to stop smoking.  He said he is waiting to hear back from pharmacy program representative to talk with representative about smoking cessation process and how pharmacy program may be able to help him with this process.  CSW and client spoke of dental care needs of client.  CSW gave client the name, address and phone number for [facility], [place], [place], [place] (phone number: [phone]).  CSW encouraged client to call agency and discuss their prices for provision of certain types of dental services.  CSW informed client that [facility] helped with dental needs for extractions, partial plates development  and complete dental plates development. Client said he had heard of [facility] and would call that agency to discuss his dental needs and inquire about dental costs through that agency.  CSW also encouraged client to call member benefits number on his [employer] card to inquire about resources or benefits available through his insurance related to obtaining C-PAP equipment for client.  Client said he would call [employer] member benefits number and discuss with representative any resources or benefits that may be available to him related to acquiring a C-PAP machine for client.  CSW gave client [facility] CSW number of [phone] and encouraged Dakota Mendez to call CSW as needed to discuss social work needs of client. Client was appreciative of receiving the above information and thanked CSW for phone call on 04/01/15.   Plan: Client to communicate with RN Dakota Mendez  related to nursing needs of client. Client to call [employer] member benefits number to discuss resources/benefits for client related to C-PAP equipment purchase for client. CSW to call client in two weeks to assess needs of client at that time.  Dakota Mendez.Dakota Mendez MSW, LCSW Licensed Clinical Social Worker [facility] Care Management [phone]

## 2015-04-02 ENCOUNTER — Other Ambulatory Visit: Payer: Self-pay | Admitting: Pharmacist

## 2015-04-02 NOTE — Patient Outreach (Signed)
[facility]) Care Management  [place]   04/02/2015  Dakota Mendez 12-28-1960 [id number]  Subjective: Dakota Mendez is a 54 y.o. male who was referred to pharmacy for medication assistance and smoking cessation.  I called the patient and he reported that he was not able to afford his glucometer strips. He reported that it was at [facility] and cost him over $100. The meter that was sent in was a OneTouch. The patient has [employer] and they prefer Accu-chek.   He told me that he had not tried to use his Medicare card (A&B) for the strips.  Patient reports that he is interested in working with a pharmacist on smoking cessation.   Objective:   Current Medications: Current Outpatient Prescriptions  Medication Sig Dispense Refill  . amLODipine (NORVASC) 10 MG tablet TAKE ONE TABLET BY MOUTH ONCE DAILY 30 tablet 2  . glipiZIDE (GLUCOTROL XL) 10 MG 24 hr tablet TAKE ONE TABLET BY MOUTH ONCE DAILY WITH BREAKFAST 60 tablet 1  . glucose blood (ONE TOUCH ULTRA TEST) test strip Use as instructed for once daily testing  Dx 250.00 length of need- 99 months 100 each 11  . losartan-hydrochlorothiazide (HYZAAR) 100-25 MG per tablet TAKE ONE TABLET BY MOUTH ONCE DAILY 30 tablet 2  . metFORMIN (GLUCOPHAGE) 1000 MG tablet TAKE ONE TABLET BY MOUTH TWICE DAILY WITH MEALS 60 tablet 4  . ONETOUCH DELICA LANCETS 21Y MISC 1 each by Does not apply route daily. For once daily testing DX 250.00 Length of need- 99 months 100 each 11  . pravastatin (PRAVACHOL) 20 MG tablet TAKE ONE TABLET BY MOUTH ONCE DAILY 30 tablet 4  . sildenafil (VIAGRA) 100 MG tablet Take 0.5-1 tablets (50-100 mg total) by mouth daily as needed for erectile dysfunction. 5 tablet 0   No current facility-administered medications for this visit.    Functional Status: In your present state of health, do you have any difficulty performing the following activities: 04/01/2015 02/17/2015  Hearing? N -  Vision? Y -   Difficulty concentrating or making decisions? N -  Walking or climbing stairs? N -  Dressing or bathing? N N  Doing errands, shopping? N N  Preparing Food and eating ? - N  Using the Toilet? - N  In the past six months, have you accidently leaked urine? - N  Do you have problems with loss of bowel control? - N  Managing your Medications? - N  Managing your Finances? - N  Housekeeping or managing your Housekeeping? - N    Fall/Depression Screening: PHQ 2/9 Scores 02/17/2015 06/18/2014 04/18/2013  PHQ - 2 Score 0 0 0  PHQ- 9 Score - 1 -    Assessment: 1. Medication assistance: patient is unable to afford his glucose strips. He has [employer] Part D. 2. Smoking cessation: patient is interested and committed to smoking cessation efforts.  Plan: 1. Medication assistance: I will send a message to Dr. Baird Cancer to prescribe an Accuchek meter and strips, which are covered by [facility]. I also told the patient that he could try to use his Medicare card to see if he could get the strips for cheaper under Part B. Patient verbalized understanding. Will make sure patient obtained strips next week.   2. Smoking cessation: will enroll patient in smoking cessation program. Will call patient next week (04/07/15) to coordinate initial home visit.   Nicoletta Ba, PharmD, [place] [facility] [phone]

## 2015-04-07 ENCOUNTER — Other Ambulatory Visit: Payer: Self-pay | Admitting: Pharmacist

## 2015-04-07 NOTE — Patient Outreach (Signed)
[facility]) Care Management  [place]   04/07/2015  Dakota Mendez Oct 22, 1960 [id number]  Subjective: Dakota Mendez is a 54 y.o. male who was referred to pharmacy for medication assistance and smoking cessation.  I called the patient to follow up on his glucometer and to schedule an appointment for smoking cessation. I was unable to reach him.   Objective:   Current Medications: Current Outpatient Prescriptions  Medication Sig Dispense Refill  . amLODipine (NORVASC) 10 MG tablet TAKE ONE TABLET BY MOUTH ONCE DAILY 30 tablet 2  . glipiZIDE (GLUCOTROL XL) 10 MG 24 hr tablet TAKE ONE TABLET BY MOUTH ONCE DAILY WITH BREAKFAST 60 tablet 1  . glucose blood (ONE TOUCH ULTRA TEST) test strip Use as instructed for once daily testing  Dx 250.00 length of need- 99 months 100 each 11  . losartan-hydrochlorothiazide (HYZAAR) 100-25 MG per tablet TAKE ONE TABLET BY MOUTH ONCE DAILY 30 tablet 2  . metFORMIN (GLUCOPHAGE) 1000 MG tablet TAKE ONE TABLET BY MOUTH TWICE DAILY WITH MEALS 60 tablet 4  . ONETOUCH DELICA LANCETS 88K MISC 1 each by Does not apply route daily. For once daily testing DX 250.00 Length of need- 99 months 100 each 11  . pravastatin (PRAVACHOL) 20 MG tablet TAKE ONE TABLET BY MOUTH ONCE DAILY 30 tablet 4  . sildenafil (VIAGRA) 100 MG tablet Take 0.5-1 tablets (50-100 mg total) by mouth daily as needed for erectile dysfunction. 5 tablet 0   No current facility-administered medications for this visit.    Functional Status: In your present state of health, do you have any difficulty performing the following activities: 04/01/2015 02/17/2015  Hearing? N -  Vision? Y -  Difficulty concentrating or making decisions? N -  Walking or climbing stairs? N -  Dressing or bathing? N N  Doing errands, shopping? N N  Preparing Food and eating ? - N  Using the Toilet? - N  In the past six months, have you accidently leaked urine? - N  Do you have problems with  loss of bowel control? - N  Managing your Medications? - N  Managing your Finances? - N  Housekeeping or managing your Housekeeping? - N    Fall/Depression Screening: PHQ 2/9 Scores 02/17/2015 06/18/2014 04/18/2013  PHQ - 2 Score 0 0 0  PHQ- 9 Score - 1 -    Assessment: 1. Medication assistance: patient is unable to afford his glucose strips. He has [employer] Part D. 2. Smoking cessation: patient is interested and committed to smoking cessation efforts.  Plan: I called the patient to discuss his glucometer and smoking cessation and I had to leave a HIPAA compliant message for patient to return my phone call.  I will reach out on 04/09/15 if the patient does not return my call today.   Nicoletta Ba, PharmD, [place] [facility] [phone]

## 2015-04-21 ENCOUNTER — Ambulatory Visit: Payer: Commercial Managed Care - HMO | Admitting: Pharmacist

## 2015-04-21 ENCOUNTER — Other Ambulatory Visit: Payer: Self-pay | Admitting: Pharmacist

## 2015-04-21 NOTE — Patient Outreach (Signed)
[facility]) Care Management  [place]   04/21/2015  LLOYDE LUDLAM 04-07-1961 [id number]  Subjective: Dakota Mendez is a 54 y.o. male who was referred to [place] for smoking cessation counseling. Patient opted to complete the counseling over the phone.   Age when started using tobacco on a daily basis 86. Number of Cigarettes per day 20. Brand smoked [place].  Smokes first cigarette 15 minutes after waking. Denies waking to smoke   Most recent quit attempt "years ago" Longest time ever been tobacco free: 3 days. What Medications (NRT, bupropion, varenicline) used in past includes varenicline which caused psych changes and made him depressed.  Rates IMPORTANCE of quitting tobacco on 1-10 scale of 8. Rates READINESS of quitting tobacco on 1-10 scale of 10. Rates CONFIDENCE of quitting tobacco on 1-10 scale of 10.   Triggers to use tobacco include; eating, stress, "just waking up in the morning"   Objective:   Current Medications: Current Outpatient Prescriptions  Medication Sig Dispense Refill  . amLODipine (NORVASC) 10 MG tablet TAKE ONE TABLET BY MOUTH ONCE DAILY 30 tablet 2  . glipiZIDE (GLUCOTROL XL) 10 MG 24 hr tablet TAKE ONE TABLET BY MOUTH ONCE DAILY WITH BREAKFAST 60 tablet 1  . glucose blood (ONE TOUCH ULTRA TEST) test strip Use as instructed for once daily testing  Dx 250.00 length of need- 99 months 100 each 11  . losartan-hydrochlorothiazide (HYZAAR) 100-25 MG per tablet TAKE ONE TABLET BY MOUTH ONCE DAILY 30 tablet 2  . metFORMIN (GLUCOPHAGE) 1000 MG tablet TAKE ONE TABLET BY MOUTH TWICE DAILY WITH MEALS 60 tablet 4  . ONETOUCH DELICA LANCETS 52W MISC 1 each by Does not apply route daily. For once daily testing DX 250.00 Length of need- 99 months 100 each 11  . pravastatin (PRAVACHOL) 20 MG tablet TAKE ONE TABLET BY MOUTH ONCE DAILY 30 tablet 4  . sildenafil (VIAGRA) 100 MG tablet Take 0.5-1 tablets (50-100 mg total) by mouth  daily as needed for erectile dysfunction. 5 tablet 0   No current facility-administered medications for this visit.    Functional Status: In your present state of health, do you have any difficulty performing the following activities: 04/01/2015 02/17/2015  Hearing? N -  Vision? Y -  Difficulty concentrating or making decisions? N -  Walking or climbing stairs? N -  Dressing or bathing? N N  Doing errands, shopping? N N  Preparing Food and eating ? - N  Using the Toilet? - N  In the past six months, have you accidently leaked urine? - N  Do you have problems with loss of bowel control? - N  Managing your Medications? - N  Managing your Finances? - N  Housekeeping or managing your Housekeeping? - N    Fall/Depression Screening: PHQ 2/9 Scores 02/17/2015 06/18/2014 04/18/2013  PHQ - 2 Score 0 0 0  PHQ- 9 Score - 1 -    Assessment/Plan: Moderate Nicotine Dependence of about 40 years duration and currently smoking a pack per day in a patient who is good candidate for success b/c of motivation to quit. Patient would like to use nicotine patches as they worked well in the past when he was hospitalized. Patient will call 1-800-QUIT-NOW to try to obtain free patches and if he is unable to, he will look at purchasing them over the counter. Once he has his patches, he will call me and I will review purpose, proper use, and potential adverse effects. Provided  information on 1 800-QUIT NOW support program.  F/U phone call in 2 weeks if I have not heard from the patient.    Nicoletta Ba, PharmD, [facility] [phone]

## 2015-04-22 ENCOUNTER — Other Ambulatory Visit: Payer: Self-pay | Admitting: *Deleted

## 2015-04-22 ENCOUNTER — Ambulatory Visit: Payer: Commercial Managed Care - HMO | Admitting: *Deleted

## 2015-04-22 NOTE — Patient Outreach (Signed)
Arrived at patient home as scheduled. No answer to door. Call to patient cell phone, no answer. RNCM left VM with RNCM contact  RNCM left [facility] business card on door with note. Plan to reschedule visit for another day when patient calls back. Royetta Crochet. Laymond Purser, RN, BSN, [place] [phone]

## 2015-04-28 ENCOUNTER — Other Ambulatory Visit: Payer: Self-pay | Admitting: *Deleted

## 2015-04-28 NOTE — Patient Outreach (Signed)
Call to patient. Patient states he is doing well, he was able to get his test strips and is able to check his blood sugars now. Patient expresses that he feels he is doing ok and does not need visit from [facility], does not express any new concerns or questions for [facility]  RNCM discussed telephonic health coach and offered referral, patient reports he does not feel he needs anything further and agrees to contact [place] and or RNCM for future needs or concerns. Plan to close out case, will update MD. Royetta Crochet. Laymond Purser, RN, BSN, [place] [phone]

## 2015-05-12 ENCOUNTER — Other Ambulatory Visit: Payer: Self-pay | Admitting: Pharmacist

## 2015-05-12 NOTE — Patient Outreach (Signed)
[place] [facility]) Care Management  [place]   05/12/2015  NIGEL ERICSSON 16-Apr-1961 [id number]  Subjective: Dakota Mendez is a 54 y.o. male who was referred to [place] for smoking cessation counseling.   I called patient to follow up to see if he was able to obtain nicotine patches from 1-800-QUIT-NOW or if he got them over the counter.   Patient reports that he has not called 1-800-QUIT-NOW or attempted to get the patches. He reports the he gets paid tomorrow. He is still interested in obtaining nicotine patches.    Objective:   Current Medications: Current Outpatient Prescriptions  Medication Sig Dispense Refill  . amLODipine (NORVASC) 10 MG tablet TAKE ONE TABLET BY MOUTH ONCE DAILY 30 tablet 2  . glipiZIDE (GLUCOTROL XL) 10 MG 24 hr tablet TAKE ONE TABLET BY MOUTH ONCE DAILY WITH BREAKFAST 60 tablet 1  . glucose blood (ONE TOUCH ULTRA TEST) test strip Use as instructed for once daily testing  Dx 250.00 length of need- 99 months 100 each 11  . losartan-hydrochlorothiazide (HYZAAR) 100-25 MG per tablet TAKE ONE TABLET BY MOUTH ONCE DAILY 30 tablet 2  . metFORMIN (GLUCOPHAGE) 1000 MG tablet TAKE ONE TABLET BY MOUTH TWICE DAILY WITH MEALS 60 tablet 4  . ONETOUCH DELICA LANCETS 41P MISC 1 each by Does not apply route daily. For once daily testing DX 250.00 Length of need- 99 months 100 each 11  . pravastatin (PRAVACHOL) 20 MG tablet TAKE ONE TABLET BY MOUTH ONCE DAILY 30 tablet 4  . sildenafil (VIAGRA) 100 MG tablet Take 0.5-1 tablets (50-100 mg total) by mouth daily as needed for erectile dysfunction. 5 tablet 0   No current facility-administered medications for this visit.    Functional Status: In your present state of health, do you have any difficulty performing the following activities: 04/01/2015 02/17/2015  Hearing? N -  Vision? Y -  Difficulty concentrating or making decisions? N -  Walking or climbing stairs? N -  Dressing or bathing? N N   Doing errands, shopping? N N  Preparing Food and eating ? - N  Using the Toilet? - N  In the past six months, have you accidently leaked urine? - N  Do you have problems with loss of bowel control? - N  Managing your Medications? - N  Managing your Finances? - N  Housekeeping or managing your Housekeeping? - N    Fall/Depression Screening: PHQ 2/9 Scores 02/17/2015 06/18/2014 04/18/2013  PHQ - 2 Score 0 0 0  PHQ- 9 Score - 1 -    Assessment/Plan: Moderate Nicotine Dependence of about 40 years duration and currently smoking a pack per day in a patient who is fair candidate for success b/c of prior motivation to quit but lack of motivation to follow through and get the patches over the last few weeks. Patient will call 1-800-QUIT-NOW today and see if they have any patches. He will also call his [employer] Benefits line to see if he has an over-the-counter benefit that would help him get the nicotine patches. I will follow up with him 05/21/15 to see if he has made any progress.     Nicoletta Ba, PharmD, [place] [facility] [phone]

## 2015-05-13 ENCOUNTER — Other Ambulatory Visit: Payer: Self-pay | Admitting: Family Medicine

## 2015-05-19 ENCOUNTER — Encounter: Payer: Commercial Managed Care - HMO | Admitting: Family Medicine

## 2015-05-21 ENCOUNTER — Other Ambulatory Visit: Payer: Self-pay

## 2015-05-21 ENCOUNTER — Other Ambulatory Visit: Payer: Self-pay | Admitting: Pharmacist

## 2015-05-21 MED ORDER — PRAVASTATIN SODIUM 20 MG PO TABS: 20.0000 mg | ORAL_TABLET | Freq: Every day | ORAL | Status: DC

## 2015-05-21 NOTE — Patient Outreach (Signed)
[facility]) Care Management  [place]   05/21/2015  NICODEMUS DENK 1961/08/02 [id number]  Subjective: Dakota Mendez is a 54 y.o. male who was referred to [place] for smoking cessation counseling.   I called patient to follow up to see if he was able to obtain nicotine patches from 1-800-QUIT-NOW or if he got them over the counter. He reported that he was able to get patches from 1-800-QUIT-NOW and that they will be coming in the mail next week.   Objective:   Current Medications: Current Outpatient Prescriptions  Medication Sig Dispense Refill  . amLODipine (NORVASC) 10 MG tablet TAKE ONE TABLET BY MOUTH ONCE DAILY 30 tablet 1  . glipiZIDE (GLUCOTROL XL) 10 MG 24 hr tablet TAKE ONE TABLET BY MOUTH ONCE DAILY WITH  BREAKFAST 60 tablet 1  . glucose blood (ONE TOUCH ULTRA TEST) test strip Use as instructed for once daily testing  Dx 250.00 length of need- 99 months 100 each 11  . losartan-hydrochlorothiazide (HYZAAR) 100-25 MG per tablet TAKE ONE TABLET BY MOUTH ONCE DAILY 30 tablet 1  . metFORMIN (GLUCOPHAGE) 1000 MG tablet TAKE ONE TABLET BY MOUTH TWICE DAILY WITH MEALS 60 tablet 4  . ONETOUCH DELICA LANCETS 99I MISC 1 each by Does not apply route daily. For once daily testing DX 250.00 Length of need- 99 months 100 each 11  . pravastatin (PRAVACHOL) 20 MG tablet Take 1 tablet (20 mg total) by mouth daily. 30 tablet 4  . sildenafil (VIAGRA) 100 MG tablet Take 0.5-1 tablets (50-100 mg total) by mouth daily as needed for erectile dysfunction. 5 tablet 0   No current facility-administered medications for this visit.    Functional Status: In your present state of health, do you have any difficulty performing the following activities: 04/01/2015 02/17/2015  Hearing? N -  Vision? Y -  Difficulty concentrating or making decisions? N -  Walking or climbing stairs? N -  Dressing or bathing? N N  Doing errands, shopping? N N  Preparing Food and eating ? -  N  Using the Toilet? - N  In the past six months, have you accidently leaked urine? - N  Do you have problems with loss of bowel control? - N  Managing your Medications? - N  Managing your Finances? - N  Housekeeping or managing your Housekeeping? - N    Fall/Depression Screening: PHQ 2/9 Scores 02/17/2015 06/18/2014 04/18/2013  PHQ - 2 Score 0 0 0  PHQ- 9 Score - 1 -    Assessment/Plan: Moderate Nicotine Dependence of about 40 years duration and currently smoking a pack per day in a patient who is fair candidate for success b/c of motivation to quit but slow in getting patches to start. Patient finally will receive patches next week. I will call him 05/28/15 to answer any questions about the patches and to make a plan for the smoking cessation attempt.   Nicoletta Ba, PharmD, [place] [facility] [phone]

## 2015-05-28 ENCOUNTER — Other Ambulatory Visit: Payer: Self-pay | Admitting: Pharmacist

## 2015-05-28 NOTE — Patient Outreach (Signed)
[place] [facility]) Care Management  [place]   05/28/2015  ARIZ TERRONES 14-Apr-1961 [id number]  Subjective: Dakota Mendez is a 54 y.o. male who was referred to [place] for smoking cessation counseling.   I called patient to follow up to see if he was able to obtain nicotine patches from 1-800-QUIT-NOW. They were supposed to arrive on 05/27/15. He reported that they still have not come in yet.   Objective:   Current Medications: Current Outpatient Prescriptions  Medication Sig Dispense Refill  . amLODipine (NORVASC) 10 MG tablet TAKE ONE TABLET BY MOUTH ONCE DAILY 30 tablet 1  . glipiZIDE (GLUCOTROL XL) 10 MG 24 hr tablet TAKE ONE TABLET BY MOUTH ONCE DAILY WITH  BREAKFAST 60 tablet 1  . glucose blood (ONE TOUCH ULTRA TEST) test strip Use as instructed for once daily testing  Dx 250.00 length of need- 99 months 100 each 11  . losartan-hydrochlorothiazide (HYZAAR) 100-25 MG per tablet TAKE ONE TABLET BY MOUTH ONCE DAILY 30 tablet 1  . metFORMIN (GLUCOPHAGE) 1000 MG tablet TAKE ONE TABLET BY MOUTH TWICE DAILY WITH MEALS 60 tablet 4  . ONETOUCH DELICA LANCETS 97D MISC 1 each by Does not apply route daily. For once daily testing DX 250.00 Length of need- 99 months 100 each 11  . pravastatin (PRAVACHOL) 20 MG tablet Take 1 tablet (20 mg total) by mouth daily. 30 tablet 4  . sildenafil (VIAGRA) 100 MG tablet Take 0.5-1 tablets (50-100 mg total) by mouth daily as needed for erectile dysfunction. 5 tablet 0   No current facility-administered medications for this visit.    Functional Status: In your present state of health, do you have any difficulty performing the following activities: 04/01/2015 02/17/2015  Hearing? N -  Vision? Y -  Difficulty concentrating or making decisions? N -  Walking or climbing stairs? N -  Dressing or bathing? N N  Doing errands, shopping? N N  Preparing Food and eating ? - N  Using the Toilet? - N  In the past six months, have  you accidently leaked urine? - N  Do you have problems with loss of bowel control? - N  Managing your Medications? - N  Managing your Finances? - N  Housekeeping or managing your Housekeeping? - N    Fall/Depression Screening: PHQ 2/9 Scores 02/17/2015 06/18/2014 04/18/2013  PHQ - 2 Score 0 0 0  PHQ- 9 Score - 1 -    Assessment/Plan: Moderate Nicotine Dependence of about 40 years duration and currently smoking a pack per day in a patient who is fair candidate for success b/c of motivation to quit but slow in getting patches to start. Patient is still waiting for patches to arrive.. I will call him 06/04/15 to answer any questions about the patches and to make a plan for the smoking cessation attempt.   Nicoletta Ba, PharmD, [place] [facility] [phone]

## 2015-06-04 ENCOUNTER — Other Ambulatory Visit: Payer: Self-pay | Admitting: Pharmacist

## 2015-06-04 NOTE — Patient Outreach (Signed)
[facility]) Care Management  [place]   06/04/2015  JOEVANNI RODDEY 1960/12/24 [id number]  Subjective: Dakota Mendez is a 54 y.o. male who was referred to [place] for smoking cessation counseling.   I called patient to follow up to see if he was able to obtain nicotine patches from 1-800-QUIT-NOW. He reported that he received them yesterday. He started using them yesterday and has been using one patch a day.  He reports that he slept with the patch on and had vivid dreams. He denies nightmares but reported that he dreamed more than he usually does.  He reports that he smoked a few puffs of a cigarette with the patch on but that he hasn't lit a cigarette and taken a few puffs from more than 2 cigarettes.   He denies any reactions to the patches.   Objective:   Current Medications: Current Outpatient Prescriptions  Medication Sig Dispense Refill  . amLODipine (NORVASC) 10 MG tablet TAKE ONE TABLET BY MOUTH ONCE DAILY 30 tablet 1  . glipiZIDE (GLUCOTROL XL) 10 MG 24 hr tablet TAKE ONE TABLET BY MOUTH ONCE DAILY WITH  BREAKFAST 60 tablet 1  . glucose blood (ONE TOUCH ULTRA TEST) test strip Use as instructed for once daily testing  Dx 250.00 length of need- 99 months 100 each 11  . losartan-hydrochlorothiazide (HYZAAR) 100-25 MG per tablet TAKE ONE TABLET BY MOUTH ONCE DAILY 30 tablet 1  . metFORMIN (GLUCOPHAGE) 1000 MG tablet TAKE ONE TABLET BY MOUTH TWICE DAILY WITH MEALS 60 tablet 4  . ONETOUCH DELICA LANCETS 30Z MISC 1 each by Does not apply route daily. For once daily testing DX 250.00 Length of need- 99 months 100 each 11  . pravastatin (PRAVACHOL) 20 MG tablet Take 1 tablet (20 mg total) by mouth daily. 30 tablet 4  . sildenafil (VIAGRA) 100 MG tablet Take 0.5-1 tablets (50-100 mg total) by mouth daily as needed for erectile dysfunction. 5 tablet 0   No current facility-administered medications for this visit.    Functional Status: In your  present state of health, do you have any difficulty performing the following activities: 04/01/2015 02/17/2015  Hearing? N -  Vision? Y -  Difficulty concentrating or making decisions? N -  Walking or climbing stairs? N -  Dressing or bathing? N N  Doing errands, shopping? N N  Preparing Food and eating ? - N  Using the Toilet? - N  In the past six months, have you accidently leaked urine? - N  Do you have problems with loss of bowel control? - N  Managing your Medications? - N  Managing your Finances? - N  Housekeeping or managing your Housekeeping? - N    Fall/Depression Screening: PHQ 2/9 Scores 02/17/2015 06/18/2014 04/18/2013  PHQ - 2 Score 0 0 0  PHQ- 9 Score - 1 -    Assessment/Plan: Moderate Nicotine Dependence of about 40 years duration and currently smoking a pack per day in a patient who is fair candidate for success b/c of motivation to quit but long history of smoking. Patient has started using patches though he has smoked some of two cigarettes. Counseled patient on the use of the patches including to monitor for skin reactions, to not smoke while on the patch, and to consider taking the patch off at night to prevent vivid dreams. Patient verbalized understanding. Congratulated patient on his efforts to quit smoking. Will call patient in one week to check in on progress  and to make sure he is not smoking still with the patch on.   Nicoletta Ba, PharmD, [place] [facility] [phone]

## 2015-06-11 ENCOUNTER — Other Ambulatory Visit: Payer: Self-pay | Admitting: Pharmacist

## 2015-06-11 NOTE — Patient Outreach (Signed)
[facility]) Care Management  [place]   06/11/2015  LEBRON NAUERT 06-Jun-1961 [id number]  Subjective: Dakota Mendez is a 54 y.o. male who was referred to [place] for smoking cessation counseling.   I called patient to follow up to see how using the nicotine replacement patches was working for him.     Objective:   Current Medications: Current Outpatient Prescriptions  Medication Sig Dispense Refill  . amLODipine (NORVASC) 10 MG tablet TAKE ONE TABLET BY MOUTH ONCE DAILY 30 tablet 1  . glipiZIDE (GLUCOTROL XL) 10 MG 24 hr tablet TAKE ONE TABLET BY MOUTH ONCE DAILY WITH  BREAKFAST 60 tablet 1  . glucose blood (ONE TOUCH ULTRA TEST) test strip Use as instructed for once daily testing  Dx 250.00 length of need- 99 months 100 each 11  . losartan-hydrochlorothiazide (HYZAAR) 100-25 MG per tablet TAKE ONE TABLET BY MOUTH ONCE DAILY 30 tablet 1  . metFORMIN (GLUCOPHAGE) 1000 MG tablet TAKE ONE TABLET BY MOUTH TWICE DAILY WITH MEALS 60 tablet 4  . ONETOUCH DELICA LANCETS 65Y MISC 1 each by Does not apply route daily. For once daily testing DX 250.00 Length of need- 99 months 100 each 11  . pravastatin (PRAVACHOL) 20 MG tablet Take 1 tablet (20 mg total) by mouth daily. 30 tablet 4  . sildenafil (VIAGRA) 100 MG tablet Take 0.5-1 tablets (50-100 mg total) by mouth daily as needed for erectile dysfunction. 5 tablet 0   No current facility-administered medications for this visit.    Functional Status: In your present state of health, do you have any difficulty performing the following activities: 04/01/2015 02/17/2015  Hearing? N -  Vision? Y -  Difficulty concentrating or making decisions? N -  Walking or climbing stairs? N -  Dressing or bathing? N N  Doing errands, shopping? N N  Preparing Food and eating ? - N  Using the Toilet? - N  In the past six months, have you accidently leaked urine? - N  Do you have problems with loss of bowel control? - N   Managing your Medications? - N  Managing your Finances? - N  Housekeeping or managing your Housekeeping? - N    Fall/Depression Screening: PHQ 2/9 Scores 02/17/2015 06/18/2014 04/18/2013  PHQ - 2 Score 0 0 0  PHQ- 9 Score - 1 -    Assessment/Plan: Moderate Nicotine Dependence of about 40 years duration and currently smoking a pack per day in a patient who is fair candidate for success b/c of motivation to quit but long history of smoking.   I called the patient to discuss smoking cessation progress and I was unable to reach him or leave a message.  The call went through but it kept dropping so we were unable to speak. Patient tried to call me back but the call was dropped x 2. Will attempt to reach out to the patient again in one week.  Nicoletta Ba, PharmD, [facility] [phone]

## 2015-06-18 ENCOUNTER — Other Ambulatory Visit: Payer: Self-pay | Admitting: Pharmacist

## 2015-06-18 NOTE — Patient Outreach (Signed)
[facility]) Care Management  [place]   06/18/2015  Dakota Mendez [birth year] [id number]  Subjective: Dakota Mendez is a 54 y.o. male who was referred to [place] for smoking cessation counseling.   I called patient to follow up to see how using the nicotine replacement patches was working for him.   He reports that he is still smoking about 1-2 cigarettes per day. The patch is working well for him. He reports that there have been times when he has had cravings and irritation. However, he is very excited about his progress as he was smoking 1 pack per day.   Objective:   Current Medications: Current Outpatient Prescriptions  Medication Sig Dispense Refill  . amLODipine (NORVASC) 10 MG tablet TAKE ONE TABLET BY MOUTH ONCE DAILY 30 tablet 1  . glipiZIDE (GLUCOTROL XL) 10 MG 24 hr tablet TAKE ONE TABLET BY MOUTH ONCE DAILY WITH  BREAKFAST 60 tablet 1  . glucose blood (ONE TOUCH ULTRA TEST) test strip Use as instructed for once daily testing  Dx 250.00 length of need- 99 months 100 each 11  . losartan-hydrochlorothiazide (HYZAAR) 100-25 MG per tablet TAKE ONE TABLET BY MOUTH ONCE DAILY 30 tablet 1  . metFORMIN (GLUCOPHAGE) 1000 MG tablet TAKE ONE TABLET BY MOUTH TWICE DAILY WITH MEALS 60 tablet 4  . ONETOUCH DELICA LANCETS 93Z MISC 1 each by Does not apply route daily. For once daily testing DX 250.00 Length of need- 99 months 100 each 11  . pravastatin (PRAVACHOL) 20 MG tablet Take 1 tablet (20 mg total) by mouth daily. 30 tablet 4  . sildenafil (VIAGRA) 100 MG tablet Take 0.5-1 tablets (50-100 mg total) by mouth daily as needed for erectile dysfunction. 5 tablet 0   No current facility-administered medications for this visit.    Functional Status: In your present state of health, do you have any difficulty performing the following activities: 04/01/2015 02/17/2015  Hearing? N -  Vision? Y -  Difficulty concentrating or making decisions? N -   Walking or climbing stairs? N -  Dressing or bathing? N N  Doing errands, shopping? N N  Preparing Food and eating ? - N  Using the Toilet? - N  In the past six months, have you accidently leaked urine? - N  Do you have problems with loss of bowel control? - N  Managing your Medications? - N  Managing your Finances? - N  Housekeeping or managing your Housekeeping? - N    Fall/Depression Screening: PHQ 2/9 Scores 02/17/2015 06/18/2014 04/18/2013  PHQ - 2 Score 0 0 0  PHQ- 9 Score - 1 -    Assessment/Plan: Moderate Nicotine Dependence of about 40 years duration and currently smoking 1-2 cigarettes in a patient who is fair candidate for success b/c of motivation to quit but long history of smoking.   Congratulated patient on success thus far. Patient to continue using the nicotine patches and try to substitute cinnamon candies or peppermints for those 1-2 cigarettes when he gets cravings. Patient agreed to trying this. Will call patient to check on progress in two weeks.   Nicoletta Ba, PharmD, [facility] [phone]

## 2015-06-24 ENCOUNTER — Ambulatory Visit: Payer: Commercial Managed Care - HMO | Admitting: Internal Medicine

## 2015-07-02 ENCOUNTER — Other Ambulatory Visit: Payer: Self-pay | Admitting: Pharmacist

## 2015-07-02 NOTE — Patient Outreach (Signed)
[facility]) Care Management  [place]   07/02/2015  Dakota Mendez Jul 31, 1961 [id number]  Subjective: Dakota Mendez is a 54 y.o. male who was referred to [place] for smoking cessation counseling.   I called patient to follow up.  Patient reports that he is out of nicotine patches. He is currently smoking 7 cigarettes a day and that is down from 20 a day, which is where he started from. He has not tried to get any more patches or any other cessation products. He reports that he is still working to completely quit but that it is "so hard."  He reports that his next visit with Dr. Moshe Cipro is in early November.    Objective:   Current Medications: Current Outpatient Prescriptions  Medication Sig Dispense Refill  . amLODipine (NORVASC) 10 MG tablet TAKE ONE TABLET BY MOUTH ONCE DAILY 30 tablet 1  . glipiZIDE (GLUCOTROL XL) 10 MG 24 hr tablet TAKE ONE TABLET BY MOUTH ONCE DAILY WITH  BREAKFAST 60 tablet 1  . glucose blood (ONE TOUCH ULTRA TEST) test strip Use as instructed for once daily testing  Dx 250.00 length of need- 99 months 100 each 11  . losartan-hydrochlorothiazide (HYZAAR) 100-25 MG per tablet TAKE ONE TABLET BY MOUTH ONCE DAILY 30 tablet 1  . metFORMIN (GLUCOPHAGE) 1000 MG tablet TAKE ONE TABLET BY MOUTH TWICE DAILY WITH MEALS 60 tablet 4  . ONETOUCH DELICA LANCETS 40H MISC 1 each by Does not apply route daily. For once daily testing DX 250.00 Length of need- 99 months 100 each 11  . pravastatin (PRAVACHOL) 20 MG tablet Take 1 tablet (20 mg total) by mouth daily. 30 tablet 4  . sildenafil (VIAGRA) 100 MG tablet Take 0.5-1 tablets (50-100 mg total) by mouth daily as needed for erectile dysfunction. 5 tablet 0   No current facility-administered medications for this visit.    Functional Status: In your present state of health, do you have any difficulty performing the following activities: 04/01/2015 02/17/2015  Hearing? N -  Vision? Y -   Difficulty concentrating or making decisions? N -  Walking or climbing stairs? N -  Dressing or bathing? N N  Doing errands, shopping? N N  Preparing Food and eating ? - N  Using the Toilet? - N  In the past six months, have you accidently leaked urine? - N  Do you have problems with loss of bowel control? - N  Managing your Medications? - N  Managing your Finances? - N  Housekeeping or managing your Housekeeping? - N    Fall/Depression Screening: PHQ 2/9 Scores 02/17/2015 06/18/2014 04/18/2013  PHQ - 2 Score 0 0 0  PHQ- 9 Score - 1 -    Assessment/Plan: Moderate Nicotine Dependence of about 40 years duration and currently smoking 1-2 cigarettes in a patient who is fair candidate for success b/c of motivation to quit but long history of smoking.   Congratulated patient on success thus far and that he didn't increase his cigarette use back to baseline once he was out of the patches. Encouraged the patient to call the 1-800-QUIT-NOW line to try to get more patches. Also encouraged patient to discuss options with Dr. Moshe Cipro when he sees her in the next few weeks. I will call him in three weeks and follow up.  Nicoletta Ba, PharmD, [facility] [phone]

## 2015-07-19 ENCOUNTER — Telehealth: Payer: Self-pay | Admitting: Family Medicine

## 2015-07-19 DIAGNOSIS — E118 Type 2 diabetes mellitus with unspecified complications: Secondary | ICD-10-CM | POA: Diagnosis not present

## 2015-07-19 DIAGNOSIS — E1165 Type 2 diabetes mellitus with hyperglycemia: Secondary | ICD-10-CM

## 2015-07-19 DIAGNOSIS — E785 Hyperlipidemia, unspecified: Secondary | ICD-10-CM

## 2015-07-19 DIAGNOSIS — Z125 Encounter for screening for malignant neoplasm of prostate: Secondary | ICD-10-CM | POA: Diagnosis not present

## 2015-07-19 NOTE — Telephone Encounter (Signed)
Patient aware and lab order sent to lab  

## 2015-07-19 NOTE — Telephone Encounter (Signed)
Dakota Mendez is scheduled to see Dr. Moshe Cipro on Thursday 07/22/15 and would like to come by the office on Tuesday and pick up lab orders to have labs drawn before Thursday, please advise?

## 2015-07-20 LAB — HEMOGLOBIN A1C
HEMOGLOBIN A1C: 7.2 % — AB (ref <5.7)
Mean Plasma Glucose: 160 mg/dL — ABNORMAL HIGH (ref <117)

## 2015-07-20 LAB — LIPID PANEL
CHOL/HDL RATIO: 5.2 ratio — AB (ref <=5.0)
Cholesterol: 170 mg/dL (ref 125–200)
HDL: 33 mg/dL — AB (ref >=40)
LDL Cholesterol: 91 mg/dL (ref <130)
Triglycerides: 230 mg/dL — ABNORMAL HIGH (ref <150)
VLDL: 46 mg/dL — AB (ref <30)

## 2015-07-21 ENCOUNTER — Other Ambulatory Visit: Payer: Self-pay | Admitting: Family Medicine

## 2015-07-21 ENCOUNTER — Other Ambulatory Visit: Payer: Self-pay

## 2015-07-21 LAB — PSA, MEDICARE: PSA: 1.12 ng/mL (ref <=4.00)

## 2015-07-21 MED ORDER — AMLODIPINE BESYLATE 10 MG PO TABS: 10.0000 mg | ORAL_TABLET | Freq: Every day | ORAL | Status: DC

## 2015-07-22 ENCOUNTER — Encounter: Payer: Self-pay | Admitting: Family Medicine

## 2015-07-22 ENCOUNTER — Ambulatory Visit (INDEPENDENT_AMBULATORY_CARE_PROVIDER_SITE_OTHER): Payer: Commercial Managed Care - HMO | Admitting: Family Medicine

## 2015-07-22 VITALS — BP 146/84 | HR 100 | Resp 18 | Ht 72.0 in | Wt 307.0 lb

## 2015-07-22 DIAGNOSIS — Z23 Encounter for immunization: Secondary | ICD-10-CM

## 2015-07-22 DIAGNOSIS — N528 Other male erectile dysfunction: Secondary | ICD-10-CM | POA: Diagnosis not present

## 2015-07-22 DIAGNOSIS — E785 Hyperlipidemia, unspecified: Secondary | ICD-10-CM

## 2015-07-22 DIAGNOSIS — F17200 Nicotine dependence, unspecified, uncomplicated: Secondary | ICD-10-CM | POA: Diagnosis not present

## 2015-07-22 DIAGNOSIS — Z6841 Body Mass Index (BMI) 40.0 and over, adult: Secondary | ICD-10-CM

## 2015-07-22 DIAGNOSIS — I10 Essential (primary) hypertension: Secondary | ICD-10-CM | POA: Diagnosis not present

## 2015-07-22 NOTE — Assessment & Plan Note (Signed)
uncontrolled  Did not  Take losartan/hctz  , will start today, nurse BP check in Dec  DASH diet and commitment to daily physical activity for a minimum of 30 minutes discussed and encouraged, as a part of hypertension management. The importance of attaining a healthy weight is also discussed.  BP/Weight 07/22/2015 04/01/2015 03/04/2015 12/15/2014 06/18/2014 123456 123456  Systolic BP 123456 0000000 Q000111Q [id number]  Diastolic BP 84 88 92 98 82 94 78  Wt. (Lbs) 307 - 300 311.04 316 314 320  BMI 41.63 - 40.68 42.18 42.85 42.58 43.39

## 2015-07-22 NOTE — Assessment & Plan Note (Signed)

## 2015-07-22 NOTE — Patient Instructions (Addendum)
Nurse BP check first week in December, need to take BOTH BP meds  Every day, today high  Wellness in [place] as before   Keep reducing cigarettes by one per month, or every 2 weeks, now down to 11 daily, need to quit  CHECk insurance about colonoscopy, you NEED that  Smaller portions and less fried foods  Thanks for choosing [facility], we consider it a privelige to serve you.  Flu vaccine today

## 2015-07-22 NOTE — Progress Notes (Signed)
Subjective:    Patient ID: Dakota Mendez, male    DOB: Aug 17, 1961, 54 y.o.   MRN: [id number]  HPI   [name]     MRN: [id number]      DOB: 02/11/1961   HPI [name] is here for follow up and re-evaluation of chronic medical conditions, medication management and review of any available recent lab and radiology data.  Preventive health is updated, specifically  Cancer screening and Immunization.   Questions or concerns regarding consultations or procedures which the PT has had in the interim are  addressed. The PT denies any adverse reactions to current medications since the last visit.  There are no new concerns.  There are no specific complaints   ROS Denies recent fever or chills. Denies sinus pressure, nasal congestion, ear pain or sore throat. Denies chest congestion, productive cough or wheezing. Denies chest pains, palpitations and leg swelling Denies abdominal pain, nausea, vomiting,diarrhea or constipation.   Denies dysuria, frequency, hesitancy or incontinence. Denies joint pain, swelling and limitation in mobility. Denies headaches, seizures, numbness, or tingling. Denies depression, anxiety or insomnia. Denies skin break down or rash.   PE  BP 146/84 mmHg  Pulse 100  Resp 18  Ht 6' (1.829 m)  Wt 307 lb (139.254 kg)  BMI 41.63 kg/m2  SpO2 96%  Patient alert and oriented and in no cardiopulmonary distress.  HEENT: No facial asymmetry, EOMI,   oropharynx pink and moist.  Neck supple no JVD, no mass.  Chest: Clear to auscultation bilaterally.  CVS: S1, S2 no murmurs, no S3.Regular rate.  ABD: Soft non tender.   Ext: No edema  MS: Adequate ROM spine, shoulders, hips and knees.  Skin: Intact, no ulcerations or rash noted.  Psych: Good eye contact, normal affect. Memory intact not anxious or depressed appearing.  CNS: CN 2-12 intact, power,  normal throughout.no focal deficits noted.   Assessment & Plan   TOBACCO ABUSE Patient  counseled for approximately 5 minutes regarding the health risks of ongoing nicotine use, specifically all types of cancer, heart disease, stroke and respiratory failure. The options available for help with cessation ,the behavioral changes to assist the process, and the option to either gradully reduce usage  Or abruptly stop.is also discussed. Pt is also encouraged to set specific goals in number of cigarettes used daily, as well as to set a quit date.  Number of cigarettes/cigars currently smoking daily:10   Essential hypertension uncontrolled  Did not  Take losartan/hctz  , will start today, nurse BP check in Dec  DASH diet and commitment to daily physical activity for a minimum of 30 minutes discussed and encouraged, as a part of hypertension management. The importance of attaining a healthy weight is also discussed.  BP/Weight 07/22/2015 04/01/2015 03/04/2015 12/15/2014 06/18/2014 123456 123456  Systolic BP 123456 0000000 Q000111Q [id number] [id number]  Diastolic BP 84 88 92 98 82 94 78  Wt. (Lbs) 307 - 300 311.04 316 314 320  BMI 41.63 - 40.68 42.18 42.85 42.58 43.39        ED (erectile dysfunction) Needs to return to urology to try injectable med which will be more cost effective  Need for prophylactic vaccination and inoculation against influenza After obtaining informed consent, the vaccine is  administered by LPN.   Hyperlipidemia LDL goal <100 Uncontrolled with inc cV risk Hyperlipidemia:Low fat diet discussed and encouraged.   Lipid Panel  Lab Results  Component Value Date   CHOL 170 07/19/2015  HDL 33* 07/19/2015   LDLCALC 91 07/19/2015   TRIG 230* 07/19/2015   CHOLHDL 5.2* 07/19/2015      Updated lab needed at/ before next visit.   Morbid obesity with body mass index of 40.0-44.9 in adult Deteriorated. Patient re-educated about  the importance of commitment to a  minimum of 150 minutes of exercise per week.  The importance of healthy food choices with portion  control discussed. Encouraged to start a food diary, count calories and to consider  joining a support group. Sample diet sheets offered. Goals set by the patient for the next several months.   Weight /BMI 07/22/2015 03/04/2015 12/15/2014  WEIGHT 307 lb 300 lb 311 lb 0.6 oz  HEIGHT 6\' 0"  6\' 0"  6\' 0"   BMI 41.63 kg/m2 40.68 kg/m2 42.18 kg/m2    Current exercise per week 150 minutes.   Type 2 diabetes mellitus with vascular disease [facility]) [name] is reminded of the importance of commitment to daily physical activity for 30 minutes or more, as able and the need to limit carbohydrate intake to 30 to 60 grams per meal to help with blood sugar control.   The need to take medication as prescribed, test blood sugar as directed, and to call between visits if there is a concern that blood sugar is uncontrolled is also discussed.   [name] is reminded of the importance of daily foot exam, annual eye examination, and good blood sugar, blood pressure and cholesterol control. Deteriorated, but controlled, no med change  Diabetic Labs Latest Ref Rng 07/19/2015 12/23/2014 06/16/2014 01/14/2014 09/16/2013  HbA1c <5.7 % 7.2(H) 6.9(H) 7.3(H) 6.6(H) 8.2(H)  Microalbumin 0.00 - 1.89 mg/dL - - - 2.01(H) -  Micro/Creat Ratio 0.0 - 30.0 mg/g - - - 10.8 -  Chol 125 - 200 mg/dL 170 168 159 - 168  HDL >=40 mg/dL 33(L) 40 41 - 41  Calc LDL <130 mg/dL 91 89 72 - 72  Triglycerides <150 mg/dL 230(H) 197(H) 228(H) - 274(H)  Creatinine 0.50 - 1.35 mg/dL - 0.99 1.15 1.05 0.91   BP/Weight 07/22/2015 04/01/2015 03/04/2015 12/15/2014 06/18/2014 123456 123456  Systolic BP 123456 0000000 Q000111Q [id number] [id number]  Diastolic BP 84 88 92 98 82 94 78  Wt. (Lbs) 307 - 300 311.04 316 314 320  BMI 41.63 - 40.68 42.18 42.85 42.58 43.39   Foot/eye exam completion dates Latest Ref Rng 04/01/2015 02/17/2015  Eye Exam No Retinopathy - No Retinopathy  Foot exam Order - - -  Foot Form Completion - Done -              Review of  Systems     Objective:   Physical Exam        Assessment & Plan:

## 2015-07-23 ENCOUNTER — Other Ambulatory Visit: Payer: Self-pay | Admitting: Pharmacist

## 2015-07-23 NOTE — Assessment & Plan Note (Signed)
Needs to return to urology to try injectable med which will be more cost effective

## 2015-07-23 NOTE — Patient Outreach (Signed)
[facility]) Care Management  [place]   07/23/2015  CAN DETTMAN 1960-12-20 [id number]  Subjective: Dakota Mendez is a 54 y.o. male who was referred to [place] for smoking cessation counseling.   I called patient to follow up but was unable to reach him. I will attempt to contact him again on 07/26/15.  Nicoletta Ba, PharmD, [facility] [phone]

## 2015-07-23 NOTE — Assessment & Plan Note (Signed)
Dakota Mendez is reminded of the importance of commitment to daily physical activity for 30 minutes or more, as able and the need to limit carbohydrate intake to 30 to 60 grams per meal to help with blood sugar control.   The need to take medication as prescribed, test blood sugar as directed, and to call between visits if there is a concern that blood sugar is uncontrolled is also discussed.   Dakota Mendez is reminded of the importance of daily foot exam, annual eye examination, and good blood sugar, blood pressure and cholesterol control. Deteriorated, but controlled, no med change  Diabetic Labs Latest Ref Rng 07/19/2015 12/23/2014 06/16/2014 01/14/2014 09/16/2013  HbA1c <5.7 % 7.2(H) 6.9(H) 7.3(H) 6.6(H) 8.2(H)  Microalbumin 0.00 - 1.89 mg/dL - - - 2.01(H) -  Micro/Creat Ratio 0.0 - 30.0 mg/g - - - 10.8 -  Chol 125 - 200 mg/dL 170 168 159 - 168  HDL >=40 mg/dL 33(L) 40 41 - 41  Calc LDL <130 mg/dL 91 89 72 - 72  Triglycerides <150 mg/dL 230(H) 197(H) 228(H) - 274(H)  Creatinine 0.50 - 1.35 mg/dL - 0.99 1.15 1.05 0.91   BP/Weight 07/22/2015 04/01/2015 03/04/2015 12/15/2014 06/18/2014 123456 123456  Systolic BP 123456 0000000 Q000111Q [id number] [id number]  Diastolic BP 84 88 92 98 82 94 78  Wt. (Lbs) 307 - 300 311.04 316 314 320  BMI 41.63 - 40.68 42.18 42.85 42.58 43.39   Foot/eye exam completion dates Latest Ref Rng 04/01/2015 02/17/2015  Eye Exam No Retinopathy - No Retinopathy  Foot exam Order - - -  Foot Form Completion - Done -

## 2015-07-23 NOTE — Assessment & Plan Note (Signed)
After obtaining informed consent, the vaccine is  administered by LPN.  

## 2015-07-23 NOTE — Assessment & Plan Note (Signed)
Uncontrolled with inc cV risk Hyperlipidemia:Low fat diet discussed and encouraged.   Lipid Panel  Lab Results  Component Value Date   CHOL 170 07/19/2015   HDL 33* 07/19/2015   LDLCALC 91 07/19/2015   TRIG 230* 07/19/2015   CHOLHDL 5.2* 07/19/2015      Updated lab needed at/ before next visit.

## 2015-07-23 NOTE — Assessment & Plan Note (Signed)
Deteriorated. Patient re-educated about  the importance of commitment to a  minimum of 150 minutes of exercise per week.  The importance of healthy food choices with portion control discussed. Encouraged to start a food diary, count calories and to consider  joining a support group. Sample diet sheets offered. Goals set by the patient for the next several months.   Weight /BMI 07/22/2015 03/04/2015 12/15/2014  WEIGHT 307 lb 300 lb 311 lb 0.6 oz  HEIGHT 6\' 0"  6\' 0"  6\' 0"   BMI 41.63 kg/m2 40.68 kg/m2 42.18 kg/m2    Current exercise per week 150 minutes.

## 2015-07-26 ENCOUNTER — Other Ambulatory Visit: Payer: Self-pay | Admitting: Pharmacist

## 2015-07-26 NOTE — Patient Outreach (Signed)
[facility]) Care Management  [place]   07/26/2015  VALERIANO COLT 04/06/1961 [id number]  Subjective: Dakota Mendez is a 54 y.o. male who was referred to [place] for smoking cessation counseling.   I called patient to follow up.  Patient reports that he has not tried to call the 1-800-QUIT-NOW number for more nicotine replacement patches. He did not discuss smoking cessation options with Dr. Moshe Cipro at his last visit. He reports that he is still smoking around 7-8 cigarettes per day and that it is too hard to cut back any further without the patches.   Objective:   Current Medications: Current Outpatient Prescriptions  Medication Sig Dispense Refill  . amLODipine (NORVASC) 10 MG tablet Take 1 tablet (10 mg total) by mouth daily. 30 tablet 5  . glipiZIDE (GLUCOTROL XL) 10 MG 24 hr tablet TAKE ONE TABLET BY MOUTH ONCE DAILY WITH  BREAKFAST 60 tablet 1  . glucose blood (ONE TOUCH ULTRA TEST) test strip Use as instructed for once daily testing  Dx 250.00 length of need- 99 months 100 each 11  . losartan-hydrochlorothiazide (HYZAAR) 100-25 MG tablet TAKE ONE TABLET BY MOUTH ONCE DAILY 30 tablet 3  . metFORMIN (GLUCOPHAGE) 1000 MG tablet TAKE ONE TABLET BY MOUTH TWICE DAILY WITH MEALS 60 tablet 4  . ONETOUCH DELICA LANCETS 99991111 MISC 1 each by Does not apply route daily. For once daily testing DX 250.00 Length of need- 99 months 100 each 11  . pravastatin (PRAVACHOL) 20 MG tablet Take 1 tablet (20 mg total) by mouth daily. 30 tablet 4  . sildenafil (VIAGRA) 100 MG tablet Take 0.5-1 tablets (50-100 mg total) by mouth daily as needed for erectile dysfunction. 5 tablet 0   No current facility-administered medications for this visit.    Functional Status: In your present state of health, do you have any difficulty performing the following activities: 04/01/2015 02/17/2015  Hearing? N -  Vision? Y -  Difficulty concentrating or making decisions? N -   Walking or climbing stairs? N -  Dressing or bathing? N N  Doing errands, shopping? N N  Preparing Food and eating ? - N  Using the Toilet? - N  In the past six months, have you accidently leaked urine? - N  Do you have problems with loss of bowel control? - N  Managing your Medications? - N  Managing your Finances? - N  Housekeeping or managing your Housekeeping? - N    Fall/Depression Screening: PHQ 2/9 Scores 02/17/2015 06/18/2014 04/18/2013  PHQ - 2 Score 0 0 0  PHQ- 9 Score - 1 -    Assessment/Plan: Moderate Nicotine Dependence of about 40 years duration and currently smoking 7-8 cigarettes in a patient who is fair candidate for success b/c of motivation to quit but long history of smoking.   Encouraged the patient to call the 1-800-QUIT-NOW line to try to get more patches. Patient verbalized understanding and agreed to call. He also reported that me following up with him has helped him to stay on track and he would like me to continue to call him. I will call him in three weeks and follow up.  Nicoletta Ba, PharmD, [place] [phone]

## 2015-08-16 ENCOUNTER — Other Ambulatory Visit: Payer: Self-pay | Admitting: Pharmacist

## 2015-08-16 NOTE — Patient Outreach (Signed)
Dakota Mendez) Care Management  [place]   08/16/2015  ANTHON PLOTTS 07-20-1961 [id number]  Subjective: Dakota Mendez is a 54 y.o. male who was referred to Dakota Mendez for smoking cessation counseling.   I called patient to follow up.  Patient reports that he has not been able to get more patches and that he has not been actively trying to find a way to get more. He reports that he is considering buying the patches. He does not have another appointment with Dr. Moshe Mendez until next month.   Patient has [employer] Part D for Medicare.    Objective:   Current Medications: Current Outpatient Prescriptions  Medication Sig Dispense Refill  . amLODipine (NORVASC) 10 MG tablet Take 1 tablet (10 mg total) by mouth daily. 30 tablet 5  . glipiZIDE (GLUCOTROL XL) 10 MG 24 hr tablet TAKE ONE TABLET BY MOUTH ONCE DAILY WITH  BREAKFAST 60 tablet 1  . glucose blood (ONE TOUCH ULTRA TEST) test strip Use as instructed for once daily testing  Dx 250.00 length of need- 99 months 100 each 11  . losartan-hydrochlorothiazide (HYZAAR) 100-25 MG tablet TAKE ONE TABLET BY MOUTH ONCE DAILY 30 tablet 3  . metFORMIN (GLUCOPHAGE) 1000 MG tablet TAKE ONE TABLET BY MOUTH TWICE DAILY WITH MEALS 60 tablet 4  . ONETOUCH DELICA LANCETS 99991111 MISC 1 each by Does not apply route daily. For once daily testing DX 250.00 Length of need- 99 months 100 each 11  . pravastatin (PRAVACHOL) 20 MG tablet Take 1 tablet (20 mg total) by mouth daily. 30 tablet 4  . sildenafil (VIAGRA) 100 MG tablet Take 0.5-1 tablets (50-100 mg total) by mouth daily as needed for erectile dysfunction. 5 tablet 0   No current facility-administered medications for this visit.    Functional Status: In your present state of health, do you have any difficulty performing the following activities: 04/01/2015 02/17/2015  Hearing? N -  Vision? Y -  Difficulty concentrating or making decisions? N -  Walking or climbing stairs? N -   Dressing or bathing? N N  Doing errands, shopping? N N  Preparing Food and eating ? - N  Using the Toilet? - N  In the past six months, have you accidently leaked urine? - N  Do you have problems with loss of bowel control? - N  Managing your Medications? - N  Managing your Finances? - N  Housekeeping or managing your Housekeeping? - N    Fall/Depression Screening: PHQ 2/9 Scores 02/17/2015 06/18/2014 04/18/2013  PHQ - 2 Score 0 0 0  PHQ- 9 Score - 1 -    Assessment/Plan: Moderate Nicotine Dependence of about 40 years duration and currently smoking 7-8 cigarettes in a patient who is fair candidate for success b/c of motivation to quit but long history of smoking. However, he has not been progressing and has not actively sought further treatment options.  Encouraged the patient to call the 1-800-QUIT-NOW line to try to get more patches every so often. Also informed him that he may have an OTC benefit with Dakota Mendez and that he could call the back of his [employer] card to find out. This benefit would allow him to use money from Dakota Mendez toward OTC products, including nicotine patches. Will not actively follow patient anymore but he can call us if he would like to re-engage. Patient verbalized understanding. Will close to pharmacy program and [facility] case management.   Dakota Mendez, PharmD, BCPS Clinical Pharmacist Dakota  C[facility] Mendez [phone]

## 2015-08-19 ENCOUNTER — Other Ambulatory Visit: Payer: Self-pay

## 2015-08-19 MED ORDER — METFORMIN HCL 1000 MG PO TABS: 1000.0000 mg | ORAL_TABLET | Freq: Two times a day (BID) | ORAL | Status: DC

## 2015-08-19 MED ORDER — GLIPIZIDE ER 10 MG PO TB24: ORAL_TABLET | ORAL | Status: DC

## 2015-08-19 MED ORDER — PRAVASTATIN SODIUM 20 MG PO TABS: 20.0000 mg | ORAL_TABLET | Freq: Every day | ORAL | Status: DC

## 2015-08-19 MED ORDER — AMLODIPINE BESYLATE 10 MG PO TABS: 10.0000 mg | ORAL_TABLET | Freq: Every day | ORAL | Status: DC

## 2015-08-19 MED ORDER — LOSARTAN POTASSIUM-HCTZ 100-25 MG PO TABS: 1.0000 | ORAL_TABLET | Freq: Every day | ORAL | Status: DC

## 2015-08-24 ENCOUNTER — Encounter: Payer: Self-pay | Admitting: Licensed Clinical Social Worker

## 2015-08-24 ENCOUNTER — Other Ambulatory Visit: Payer: Self-pay | Admitting: Licensed Clinical Social Worker

## 2015-08-24 NOTE — Patient Outreach (Signed)
Assessment: CSW completed chart review on client on 08/24/15.  CSW called client on 08/24/15. CSW spoke via phone with client on 08/24/15.  CSW verified identity of client.  Client reported that he was attending his scheduled medical appointments. Client said he has next appointment with Dr. Moshe Cipro, his primary care doctor,  in January of 2017. He said he recently received prescribed medications via mail order delivery through [facility]. He said he has his prescribed medications at present  He said he drives himself to his medical appointments. He said he has adequate food supply. He said he appreciated support through Nicoletta Ba with [facility] program related to smoking cessation for client. CSW and client discussed client care plan and fact that client had met his care plan goals. Client agreed and stated he did not feel that he had any other social work needs to address at present.  CSW informed client on 08/24/15 that [facility] would discharge client from [facility] services on 08/24/15 since client goals were met.  Client agreed to plan.  CSW thanked client for his participation in [facility] program support. Client was appreciative of support he had received with [facility] staff.    Plan:  CSW is discharging client on 08/24/15 from [place] services since client goals for social work have been met.   CSW to inform Lurline Del that [facility] discharged client on 08/24/15 from [facility] services. CSW to fax physician case closure letter to Dr. Moshe Cipro informing Dr. Moshe Cipro of  [facility] discharge of client on 08/24/15.  [name].Jeraldine Primeau MSW, LCSW Licensed Clinical Social Worker [facility] Care Management [phone]

## 2015-09-02 ENCOUNTER — Encounter: Payer: Self-pay | Admitting: Internal Medicine

## 2015-09-02 ENCOUNTER — Ambulatory Visit: Payer: Commercial Managed Care - HMO | Admitting: Internal Medicine

## 2015-09-02 ENCOUNTER — Telehealth: Payer: Self-pay | Admitting: Family Medicine

## 2015-09-02 ENCOUNTER — Ambulatory Visit (INDEPENDENT_AMBULATORY_CARE_PROVIDER_SITE_OTHER)
Admission: RE | Admit: 2015-09-02 | Discharge: 2015-09-02 | Disposition: A | Discharge status: Home / Self Care | Payer: Commercial Managed Care - HMO | Source: Ambulatory Visit | Attending: Internal Medicine | Admitting: Internal Medicine

## 2015-09-02 VITALS — BP 128/88 | HR 116 | Ht 72.0 in | Wt 301.8 lb

## 2015-09-02 DIAGNOSIS — N528 Other male erectile dysfunction: Secondary | ICD-10-CM

## 2015-09-02 DIAGNOSIS — F17200 Nicotine dependence, unspecified, uncomplicated: Secondary | ICD-10-CM

## 2015-09-02 DIAGNOSIS — F17210 Nicotine dependence, cigarettes, uncomplicated: Secondary | ICD-10-CM | POA: Diagnosis not present

## 2015-09-02 DIAGNOSIS — E669 Obesity, unspecified: Secondary | ICD-10-CM | POA: Diagnosis not present

## 2015-09-02 DIAGNOSIS — E785 Hyperlipidemia, unspecified: Secondary | ICD-10-CM | POA: Diagnosis not present

## 2015-09-02 NOTE — Telephone Encounter (Signed)
Referral entered for urology

## 2015-09-02 NOTE — Patient Instructions (Signed)
Order- CXR   Dx tobacco use  Please try harder to stop smoking  Weight loss would help your breathing while you sleep

## 2015-09-02 NOTE — Telephone Encounter (Signed)
Asking to speak to Dr. Camillia Herter Nurse

## 2015-09-02 NOTE — Progress Notes (Signed)
06/13/13- 75 yoM smoker (1ppd) Dr Joycelyn Schmid Simpson-no sleep study; wakes himself up snoring if really tired.- Wife here They admit snoring, worst on back, so he prefers side sleeping. Told O2 dropped when sedated after C-spine surgery. Not aware of daytime sleepiness. HS 10-11PM with no sleep med. Latency 10-15 minutes, waking twice before up 6:30 AM. No ENT surgery. Not aware of family hx OSA. Disabled truck driver. Medical hx HBP, DMII, obesity. Asks flu vax.  Discussed smoking.  08/04/13-  26 yoM smoker (1ppd) followed for OSA          PCP Dr Moshe Cipro- FOLLOWS FOR: review sleep study  NPSG 07/14/13- severe obstructive sleep apnea, AHI 50.1 per hour, weight 310 pounds Educated on treatment options, questions answered.emphasized importance of his weight and his responsibility to drive carefully.  12/11/13- 12 yoM smoker (1ppd) followed for OSA, complicated by tobacco use, DGD         PCP Dr Moshe Cipro- FOLLOWS FOR:  Has not yet started CPAP due to financial situation-States will get machine soon.   DME - [facility] Smoking without effort to stop. Denies cough  09/02/2015-54 year old male smoker (1 pack per day) 6 followed for OSA, complicated by tobacco use, DGD FOLLOWS FOR: Pt states that he has not started on CPAP yet- has not picked this up since last order placed 2015.   CXR 12/11/2013 IMPRESSION: Atelectasis versus infiltrate within the lung bases. Accentuated by the low lung volumes. Repeat PA and lateral view as clinically indicated recommended. Electronically Signed  By: Margaree Mackintosh M.D.  On: 12/11/2013 10:54   ROS-see HPI Constitutional:   No-   weight loss, night sweats, fevers, chills, fatigue, lassitude. HEENT:   No-  headaches, difficulty swallowing, tooth/dental problems, sore throat,       No-  sneezing, itching, ear ache, nasal congestion, post nasal drip,  CV:  No-   chest pain, orthopnea, PND, swelling in lower extremities, anasarca, dizziness, palpitations Resp: No-    shortness of breath with exertion or at rest.              No-   productive cough,  No non-productive cough,  No- coughing up of blood.              No-   change in color of mucus.  No- wheezing.   Skin: No-   rash or lesions. GI:  No-   heartburn, indigestion, abdominal pain, nausea, vomiting,  GU: . MS:  No-   joint pain or swelling.   Neuro-     nothing unusual Psych:  No- change in mood or affect. No depression or anxiety.  No memory loss.  OBJ- Physical Exam General- Alert, Oriented, Affect-appropriate, Distress- none acute. Obese Skin- rash-none, lesions- none, excoriation- none Lymphadenopathy- none Head- atraumatic            Eyes- Gross vision intact, PERRLA, conjunctivae and secretions clear            Ears- Hearing, canals-normal            Nose- Clear, no-Septal dev, mucus, polyps, erosion, perforation             Throat- Mallampati III , mucosa clear , drainage- none, tonsils- atrophic. +Missing teeth Neck- flexible , trachea midline, no stridor , thyroid nl, carotid no bruit. +surgical scar Chest - symmetrical excursion , unlabored           Heart/CV- RRR , no murmur , no gallop  , no rub, nl s1 s2                           -  JVD- none , edema- none, stasis changes- none, varices- none           Lung- clear to P&A, wheeze- none, cough- none , dullness-none, rub- none           Chest wall-  Abd-  Br/ Gen/ Rectal- Not done, not indicated Extrem- cyanosis- none, clubbing, none, atrophy- none, strength- nl Neuro- grossly intact to observation

## 2015-09-08 ENCOUNTER — Telehealth: Payer: Self-pay | Admitting: Internal Medicine

## 2015-09-08 ENCOUNTER — Telehealth: Payer: Self-pay | Admitting: Family Medicine

## 2015-09-08 NOTE — Telephone Encounter (Signed)
Last seen on 09/02/15 with CY  Patient Instructions       Order- CXR   Dx tobacco use  Please try harder to stop smoking  Weight loss would help your breathing while you sleep   Called and spoke with patient. Patient request cxr results from cxr done at ov with CY on 09/02/15. I explained to the patient that we have not received the cxr results but I will send a message to [facility] and will call him back once cxr results have been received. Patient voiced understanding and had no further questions.   CY please advise on cxr

## 2015-09-08 NOTE — Telephone Encounter (Signed)
Please call with Xray Results

## 2015-09-08 NOTE — Telephone Encounter (Signed)
Advised to call the ordering provider for the results- dr young

## 2015-09-08 NOTE — Telephone Encounter (Signed)
Notes Recorded by Deneise Lever, MD on 09/08/2015 at 12:34 PM CXR- brnchitis changes with thickening of airway lining. Otherwise no pneumonia or active problems seen ----  I spoke with patient about results and he verbalized understanding and had no questions

## 2015-09-08 NOTE — Telephone Encounter (Signed)
"  Result" of CXR is posted

## 2015-09-23 ENCOUNTER — Encounter: Payer: Commercial Managed Care - HMO | Admitting: Family Medicine

## 2015-10-05 ENCOUNTER — Ambulatory Visit: Payer: Commercial Managed Care - HMO | Admitting: Urology

## 2015-11-10 ENCOUNTER — Ambulatory Visit: Payer: Commercial Managed Care - HMO | Admitting: Urology

## 2015-11-27 ENCOUNTER — Other Ambulatory Visit: Payer: Self-pay | Admitting: Family Medicine

## 2015-11-27 DIAGNOSIS — E1165 Type 2 diabetes mellitus with hyperglycemia: Secondary | ICD-10-CM | POA: Diagnosis not present

## 2015-11-27 DIAGNOSIS — E785 Hyperlipidemia, unspecified: Secondary | ICD-10-CM | POA: Diagnosis not present

## 2015-11-27 DIAGNOSIS — I10 Essential (primary) hypertension: Secondary | ICD-10-CM | POA: Diagnosis not present

## 2015-11-27 DIAGNOSIS — Z125 Encounter for screening for malignant neoplasm of prostate: Secondary | ICD-10-CM | POA: Diagnosis not present

## 2015-11-27 LAB — COMPLETE METABOLIC PANEL WITH GFR
ALT: 10 U/L (ref 9–46)
AST: 11 U/L (ref 10–35)
Albumin: 4 g/dL (ref 3.6–5.1)
Alkaline Phosphatase: 75 U/L (ref 40–115)
BILIRUBIN TOTAL: 1 mg/dL (ref 0.2–1.2)
BUN: 15 mg/dL (ref 7–25)
CO2: 27 mmol/L (ref 20–31)
CREATININE: 1.03 mg/dL (ref 0.70–1.33)
Calcium: 9.1 mg/dL (ref 8.6–10.3)
Chloride: 100 mmol/L (ref 98–110)
GFR, EST NON AFRICAN AMERICAN: 83 mL/min (ref >=60)
GFR, Est African American: >89 mL/min (ref >=60)
GLUCOSE: 120 mg/dL — AB (ref 65–99)
Potassium: 4 mmol/L (ref 3.5–5.3)
SODIUM: 138 mmol/L (ref 135–146)
TOTAL PROTEIN: 7 g/dL (ref 6.1–8.1)

## 2015-11-27 LAB — HEMOGLOBIN A1C
HEMOGLOBIN A1C: 6.3 % — AB (ref <5.7)
MEAN PLASMA GLUCOSE: 134 mg/dL — AB (ref <117)

## 2015-11-27 LAB — LIPID PANEL
Cholesterol: 166 mg/dL (ref 125–200)
HDL: 42 mg/dL (ref >=40)
LDL CALC: 91 mg/dL (ref <130)
Total CHOL/HDL Ratio: 4 Ratio (ref <=5.0)
Triglycerides: 163 mg/dL — ABNORMAL HIGH (ref <150)
VLDL: 33 mg/dL — ABNORMAL HIGH (ref <30)

## 2015-11-29 ENCOUNTER — Ambulatory Visit (INDEPENDENT_AMBULATORY_CARE_PROVIDER_SITE_OTHER): Payer: Commercial Managed Care - HMO | Admitting: Family Medicine

## 2015-11-29 ENCOUNTER — Encounter: Payer: Self-pay | Admitting: Family Medicine

## 2015-11-29 VITALS — BP 140/92 | HR 99 | Resp 16 | Ht 72.0 in | Wt 299.0 lb

## 2015-11-29 DIAGNOSIS — E1159 Type 2 diabetes mellitus with other circulatory complications: Secondary | ICD-10-CM

## 2015-11-29 DIAGNOSIS — I10 Essential (primary) hypertension: Secondary | ICD-10-CM | POA: Diagnosis not present

## 2015-11-29 DIAGNOSIS — E785 Hyperlipidemia, unspecified: Secondary | ICD-10-CM

## 2015-11-29 DIAGNOSIS — Z0000 Encounter for general adult medical examination without abnormal findings: Secondary | ICD-10-CM | POA: Insufficient documentation

## 2015-11-29 LAB — PSA, MEDICARE: PSA: 0.73 ng/mL (ref <=4.00)

## 2015-11-29 MED ORDER — CLONIDINE HCL 0.2 MG PO TABS: 0.2000 mg | ORAL_TABLET | Freq: Every day | ORAL | Status: DC

## 2015-11-29 NOTE — Patient Instructions (Addendum)
Annual physical exam in early September, call if you need me before  Nurse BP check first week in May  New additional medication for blood pressure clonidine 0.2 mg one at bedtime, continue what you are taking no, ( amlodipine and losartan)  Fasting labs 1 week before next visit  Improved labs  And weight loss , keep it up  Fall Prevention in the Home  Falls can cause injuries. They can happen to people of all ages. There are many things you can do to make your home safe and to help prevent falls.  WHAT CAN I DO ON THE OUTSIDE OF MY HOME?  Regularly fix the edges of walkways and driveways and fix any cracks.  Remove anything that might make you trip as you walk through a door, such as a raised step or threshold.  Trim any bushes or trees on the path to your home.  Use bright outdoor lighting.  Clear any walking paths of anything that might make someone trip, such as rocks or tools.  Regularly check to see if handrails are loose or broken. Make sure that both sides of any steps have handrails.  Any raised decks and porches should have guardrails on the edges.  Have any leaves, snow, or ice cleared regularly.  Use sand or salt on walking paths during winter.  Clean up any spills in your garage right away. This includes oil or grease spills. WHAT CAN I DO IN THE BATHROOM?   Use night lights.  Install grab bars by the toilet and in the tub and shower. Do not use towel bars as grab bars.  Use non-skid mats or decals in the tub or shower.  If you need to sit down in the shower, use a plastic, non-slip stool.  Keep the floor dry. Clean up any water that spills on the floor as soon as it happens.  Remove soap buildup in the tub or shower regularly.  Attach bath mats securely with double-sided non-slip rug tape.  Do not have throw rugs and other things on the floor that can make you trip. WHAT CAN I DO IN THE BEDROOM?  Use night lights.  Make sure that you have a light by  your bed that is easy to reach.  Do not use any sheets or blankets that are too big for your bed. They should not hang down onto the floor.  Have a firm chair that has side arms. You can use this for support while you get dressed.  Do not have throw rugs and other things on the floor that can make you trip. WHAT CAN I DO IN THE KITCHEN?  Clean up any spills right away.  Avoid walking on wet floors.  Keep items that you use a lot in easy-to-reach places.  If you need to reach something above you, use a strong step stool that has a grab bar.  Keep electrical cords out of the way.  Do not use floor polish or wax that makes floors slippery. If you must use wax, use non-skid floor wax.  Do not have throw rugs and other things on the floor that can make you trip. WHAT CAN I DO WITH MY STAIRS?  Do not leave any items on the stairs.  Make sure that there are handrails on both sides of the stairs and use them. Fix handrails that are broken or loose. Make sure that handrails are as long as the stairways.  Check any carpeting to make sure that  it is firmly attached to the stairs. Fix any carpet that is loose or worn.  Avoid having throw rugs at the top or bottom of the stairs. If you do have throw rugs, attach them to the floor with carpet tape.  Make sure that you have a light switch at the top of the stairs and the bottom of the stairs. If you do not have them, ask someone to add them for you. WHAT ELSE CAN I DO TO HELP PREVENT FALLS?  Wear shoes that:  Do not have high heels.  Have rubber bottoms.  Are comfortable and fit you well.  Are closed at the toe. Do not wear sandals.  If you use a stepladder:  Make sure that it is fully opened. Do not climb a closed stepladder.  Make sure that both sides of the stepladder are locked into place.  Ask someone to hold it for you, if possible.  Clearly mark and make sure that you can see:  Any grab bars or handrails.  First and  last steps.  Where the edge of each step is.  Use tools that help you move around (mobility aids) if they are needed. These include:  Canes.  Walkers.  Scooters.  Crutches.  Turn on the lights when you go into a dark area. Replace any light bulbs as soon as they burn out.  Set up your furniture so you have a clear path. Avoid moving your furniture around.  If any of your floors are uneven, fix them.  If there are any pets around you, be aware of where they are.  Review your medicines with your doctor. Some medicines can make you feel dizzy. This can increase your chance of falling. Ask your doctor what other things that you can do to help prevent falls.   This information is not intended to replace advice given to you by your health care provider. Make sure you discuss any questions you have with your health care provider.   Document Released: 06/24/2009 Document Revised: 01/12/2015 Document Reviewed: 10/02/2014 Elsevier Interactive Patient Education [employer].

## 2015-11-29 NOTE — Assessment & Plan Note (Signed)

## 2015-11-29 NOTE — Progress Notes (Signed)
Subjective:    Patient ID: Dakota Mendez, male    DOB: 1961-01-10, 55 y.o.   MRN: [id number]  HPI Preventive Screening-Counseling & Management   Patient present here today for an initial  Medicare annual wellness visit. Recent lab data discussed in particular diabetes control and hyperlipidemia. Hep C screen and HIV were added   Current Problems (verified)   Medications Prior to Visit Allergies (verified)   PAST HISTORY  Family History (verified)   Social History  Married, 4 children (2 step children) disabled since 2008. Current smoker    Risk Factors  Current exercise habits:  Goes fishing a lot and walks as much as he can daily   Dietary issues discussed: encouraged to limit carbs and fried foods. States he eats a lot of fish    Cardiac risk factors: type 2 dm   Depression Screen  (Note: if answer to either of the following is "Yes", a more complete depression screening is indicated)   Over the past two weeks, have you felt down, depressed or hopeless? No  Over the past two weeks, have you felt little interest or pleasure in doing things? No  Have you lost interest or pleasure in daily life? No  Do you often feel hopeless? No  Do you cry easily over simple problems? No   Activities of Daily Living  In your present state of health, do you have any difficulty performing the following activities?  Driving?: no  Managing money?: No Feeding yourself?:No Getting from bed to chair?:No Climbing a flight of stairs?:No Preparing food and eating?:No Bathing or showering?:No Getting dressed?:No Getting to the toilet?:No Using the toilet?:No Moving around from place to place?: No  Fall Risk Assessment In the past year have you fallen or had a near fall?: 4 falls due to weakness on his right side Are you currently taking any medications that make you dizzy?:No   Hearing Difficulties: No Do you often ask people to speak up or repeat themselves?:No Do you  experience ringing or noises in your ears?:No Do you have difficulty understanding soft or whispered voices?:No  Cognitive Testing  Alert? Yes Normal Appearance?Yes  Oriented to person? Yes Place? Yes  Time? Yes  Displays appropriate judgment?Yes  Can read the correct time from a watch face? yes Are you having problems remembering things?No  Advanced Directives have been discussed with the patient?Yes, brochure given, full code   List the Names of Other Physician/Practitioners you currently use:  Dr Annamaria Boots (pulmonary)  Indicate any recent Medical Services you may have received from other than [facility] providers in the past year (date may be approximate).   Assessment:    Annual Wellness Exam   Plan:    Medicare Attestation  I have personally reviewed:  The patient's medical and social history  Their use of alcohol, tobacco or illicit drugs  Their current medications and supplements  The patient's functional ability including ADLs,fall risks, home safety risks, cognitive, and hearing and visual impairment  Diet and physical activities  Evidence for depression or mood disorders  The patient's weight, height, BMI, and visual acuity have been recorded in the chart. I have made referrals, counseling, and provided education to the patient based on review of the above and I have provided the patient with a written personalized care plan for preventive services.      Review of Systems     Objective:   Physical Exam  BP 140/92 mmHg  Pulse 99  Resp 16  Ht  6' (1.829 m)  Wt 299 lb (135.626 kg)  BMI 40.54 kg/m2  SpO2 97%         Assessment & Plan:  Essential hypertension Uncontrolled DASH diet and commitment to daily physical activity for a minimum of 30 minutes discussed and encouraged, as a part of hypertension management. The importance of attaining a healthy weight is also discussed.  BP/Weight 11/29/2015 09/02/2015 07/22/2015 04/01/2015 03/04/2015 12/15/2014 [id number]    Systolic BP [id number] 0000000 123456 0000000 [id number]  Diastolic BP 92 88 84 88 92 98 82  Wt. (Lbs) 299 301.8 307 - 300 311.04 316  BMI 40.54 40.92 41.63 - 40.68 42.18 42.85   Add clonidine     Medicare annual wellness visit, initial Annual exam as documented. Counseling done  re healthy lifestyle involving commitment to 150 minutes exercise per week, heart healthy diet, and attaining healthy weight.The importance of adequate sleep also discussed. Regular seat belt use and home safety, is also discussed. Changes in health habits are decided on by the patient with goals and time frames  set for achieving them. Immunization and cancer screening needs are specifically addressed at this visit.   Type 2 diabetes mellitus with vascular disease ([place]) Improved and well controlled , no change in managment  Hyperlipidemia LDL goal <100 Improved but not at goal, encouraged to lower fat intake

## 2015-11-29 NOTE — Assessment & Plan Note (Signed)
Improved and well controlled , no change in managment

## 2015-11-29 NOTE — Assessment & Plan Note (Signed)
Improved but not at goal, encouraged to lower fat intake

## 2015-11-29 NOTE — Assessment & Plan Note (Signed)
Uncontrolled DASH diet and commitment to daily physical activity for a minimum of 30 minutes discussed and encouraged, as a part of hypertension management. The importance of attaining a healthy weight is also discussed.  BP/Weight 11/29/2015 09/02/2015 07/22/2015 04/01/2015 03/04/2015 12/15/2014 [id number]  Systolic BP [id number] 0000000 123456 0000000 [id number] [id number]  Diastolic BP 92 88 84 88 92 98 82  Wt. (Lbs) 299 301.8 307 - 300 311.04 316  BMI 40.54 40.92 41.63 - 40.68 42.18 42.85   Add clonidine

## 2015-11-30 LAB — HIV ANTIBODY (ROUTINE TESTING W REFLEX): HIV 1&2 Ab, 4th Generation: NONREACTIVE

## 2015-11-30 LAB — HEPATITIS C ANTIBODY: HCV Ab: NEGATIVE

## 2015-12-06 ENCOUNTER — Telehealth: Payer: Self-pay | Admitting: Family Medicine

## 2015-12-06 NOTE — Telephone Encounter (Signed)
Dakota Mendez is calling requesting test results, please advise?

## 2015-12-06 NOTE — Telephone Encounter (Signed)
Aware that add on tests were normal

## 2016-01-13 ENCOUNTER — Other Ambulatory Visit: Payer: Self-pay | Admitting: Family Medicine

## 2016-01-14 ENCOUNTER — Other Ambulatory Visit: Payer: Self-pay | Admitting: Family Medicine

## 2016-03-03 ENCOUNTER — Other Ambulatory Visit: Payer: Self-pay

## 2016-03-03 DIAGNOSIS — I10 Essential (primary) hypertension: Secondary | ICD-10-CM

## 2016-03-03 MED ORDER — CLONIDINE HCL 0.2 MG PO TABS: 0.2000 mg | ORAL_TABLET | Freq: Every day | ORAL | Status: DC

## 2016-03-06 ENCOUNTER — Ambulatory Visit: Payer: Commercial Managed Care - HMO

## 2016-03-06 VITALS — BP 128/82

## 2016-03-06 DIAGNOSIS — I10 Essential (primary) hypertension: Secondary | ICD-10-CM

## 2016-03-06 NOTE — Progress Notes (Signed)
In for blood pressure check and meds refilled. BP was much improved on new medication. Advised to keep next appt and continue same meds

## 2016-05-25 ENCOUNTER — Other Ambulatory Visit: Payer: Self-pay | Admitting: Family Medicine

## 2016-05-25 ENCOUNTER — Telehealth: Payer: Self-pay | Admitting: Family Medicine

## 2016-05-25 NOTE — Telephone Encounter (Signed)
Called to reschedule - Dr. Simpson out of town °

## 2016-05-31 DIAGNOSIS — E785 Hyperlipidemia, unspecified: Secondary | ICD-10-CM | POA: Diagnosis not present

## 2016-05-31 DIAGNOSIS — I10 Essential (primary) hypertension: Secondary | ICD-10-CM | POA: Diagnosis not present

## 2016-05-31 DIAGNOSIS — E1159 Type 2 diabetes mellitus with other circulatory complications: Secondary | ICD-10-CM | POA: Diagnosis not present

## 2016-05-31 LAB — COMPLETE METABOLIC PANEL WITH GFR
ALBUMIN: 3.8 g/dL (ref 3.6–5.1)
ALK PHOS: 63 U/L (ref 40–115)
ALT: 12 U/L (ref 9–46)
AST: 11 U/L (ref 10–35)
BUN: 17 mg/dL (ref 7–25)
CHLORIDE: 104 mmol/L (ref 98–110)
CO2: 24 mmol/L (ref 20–31)
Calcium: 9.3 mg/dL (ref 8.6–10.3)
Creat: 0.98 mg/dL (ref 0.70–1.33)
GFR, EST NON AFRICAN AMERICAN: 88 mL/min (ref >=60)
GFR, Est African American: >89 mL/min (ref >=60)
GLUCOSE: 112 mg/dL — AB (ref 65–99)
POTASSIUM: 4.3 mmol/L (ref 3.5–5.3)
SODIUM: 140 mmol/L (ref 135–146)
Total Bilirubin: 0.8 mg/dL (ref 0.2–1.2)
Total Protein: 6.7 g/dL (ref 6.1–8.1)

## 2016-05-31 LAB — LIPID PANEL
CHOL/HDL RATIO: 4.2 ratio (ref <=5.0)
Cholesterol: 175 mg/dL (ref 125–200)
HDL: 42 mg/dL (ref >=40)
LDL Cholesterol: 100 mg/dL (ref <130)
Triglycerides: 165 mg/dL — ABNORMAL HIGH (ref <150)
VLDL: 33 mg/dL — ABNORMAL HIGH (ref <30)

## 2016-06-01 ENCOUNTER — Ambulatory Visit (INDEPENDENT_AMBULATORY_CARE_PROVIDER_SITE_OTHER): Payer: Commercial Managed Care - HMO | Admitting: Family Medicine

## 2016-06-01 ENCOUNTER — Encounter: Payer: Self-pay | Admitting: Family Medicine

## 2016-06-01 VITALS — BP 130/90 | HR 110 | Resp 16 | Ht 72.0 in | Wt 299.0 lb

## 2016-06-01 DIAGNOSIS — E1159 Type 2 diabetes mellitus with other circulatory complications: Secondary | ICD-10-CM

## 2016-06-01 DIAGNOSIS — Z1211 Encounter for screening for malignant neoplasm of colon: Secondary | ICD-10-CM

## 2016-06-01 DIAGNOSIS — E785 Hyperlipidemia, unspecified: Secondary | ICD-10-CM

## 2016-06-01 DIAGNOSIS — Z0000 Encounter for general adult medical examination without abnormal findings: Secondary | ICD-10-CM | POA: Diagnosis not present

## 2016-06-01 DIAGNOSIS — I10 Essential (primary) hypertension: Secondary | ICD-10-CM

## 2016-06-01 DIAGNOSIS — Z23 Encounter for immunization: Secondary | ICD-10-CM | POA: Diagnosis not present

## 2016-06-01 DIAGNOSIS — F17200 Nicotine dependence, unspecified, uncomplicated: Secondary | ICD-10-CM

## 2016-06-01 LAB — POC HEMOCCULT BLD/STL (OFFICE/1-CARD/DIAGNOSTIC): Fecal Occult Blood, POC: NEGATIVE

## 2016-06-01 LAB — HEMOGLOBIN A1C
Hgb A1c MFr Bld: 6.6 % — ABNORMAL HIGH (ref <5.7)
Mean Plasma Glucose: 143 mg/dL

## 2016-06-01 MED ORDER — CLONIDINE HCL 0.2 MG PO TABS: 0.2000 mg | ORAL_TABLET | Freq: Every day | ORAL | 1 refills | Status: DC

## 2016-06-01 NOTE — Patient Instructions (Signed)
F/u in 4 month, call if you need me sooner  Flu and pneumonia 23 vaccines today  Reduce by 1 cigarette each week, you need to QUIT, start at 14 per day today  Keep appointments for colonoscopy and eye exam you need both  Fasting lipid, cmp and eGFR and hBA1C in 4 months.  Call for podiatry for toenail cutting when you are ready, you should not cut your toenails  Please work on good  health habits so that your health will improve. 1. Commitment to daily physical activity for 30 to 60  minutes, if you are able to do this.  2. Commitment to wise food choices. Aim for half of your  food intake to be vegetable and fruit, one quarter starchy foods, and one quarter protein. Try to eat on a regular schedule  3 meals per day, snacking between meals should be limited to vegetables or fruits or small portions of nuts. 64 ounces of water per day is generally recommended, unless you have specific health conditions, like heart failure or kidney failure where you will need to limit fluid intake.  3. Commitment to sufficient and a  good quality of physical and mental rest daily, generally between 6 to 8 hours per day.  WITH PERSISTANCE AND PERSEVERANCE, THE IMPOSSIBLE , BECOMES THE NORM!   Thank you  for choosing [facility]. We consider it a privelige to serve you.  Delivering excellent health care in a caring and  compassionate way is our goal.  Partnering with you,  so that together we can achieve this goal is our strategy.

## 2016-06-01 NOTE — Assessment & Plan Note (Signed)

## 2016-06-01 NOTE — Progress Notes (Signed)
Dakota Mendez     MRN: [id number]      DOB: Jan 22, 1961   HPI: Patient is in for annual physical exam. No other health concerns are expressed or addressed at the visit. Recent labs,  are reviewed. Immunization is reviewed , and  updated     PE; Pleasant male, alert and oriented x 3, in no cardio-pulmonary distress. Afebrile. HEENT No facial trauma or asymetry. Sinuses non tender. EOMI, pupils equally reactive to light. External ears normal, tympanic membranes clear. Oropharynx moist, no exudate. Neck: supple, no adenopathy,JVD or thyromegaly.No bruits.  Chest: Clear to ascultation bilaterally.No crackles or wheezes. Non tender to palpation  Breast: No asymetry,no masses. No nipple discharge or inversion. No axillary or supraclavicular adenopathy  Cardiovascular system; Heart sounds normal,  S1 and  S2 ,no S3.  No murmur, or thrill. Apical beat not displaced Peripheral pulses normal.  Abdomen: Soft, non tender, no organomegaly or masses. No bruits. Bowel sounds normal. No guarding, tenderness or rebound.  Rectal:  Normal sphincter tone. No hemorrhoids or  masses. guaiac negative stool. Prostate smooth and firm    Musculoskeletal exam: Full ROM of spine, hips , shoulders and knees. No deformity ,swelling or crepitus noted. No muscle wasting or atrophy.   Neurologic: Cranial nerves 2 to 12 intact. Power, tone ,sensation and reflexes normal throughout. No disturbance in gait. No tremor.  Skin: Intact, no ulceration, erythema , scaling or rash noted. Pigmentation normal throughout Bilateral onychomycosis and tinea pedis Psych; Normal mood and affect. Judgement and concentration normal   Assessment & Plan:  Annual physical exam Annual exam as documented. Counseling done  re healthy lifestyle involving commitment to 150 minutes exercise per week, heart healthy diet, and attaining healthy weight.The importance of adequate sleep also discussed. Regular  seat belt use and home safety, is also discussed. Changes in health habits are decided on by the patient with goals and time frames  set for achieving them. Immunization and cancer screening needs are specifically addressed at this visit.   Need for prophylactic vaccination and inoculation against influenza After obtaining informed consent, the vaccine is  administered by LPN.   Need for 23-polyvalent pneumococcal polysaccharide vaccine After obtaining informed consent, the vaccine is  administered by LPN.   TOBACCO ABUSE Patient counseled for approximately 5 minutes regarding the health risks of ongoing nicotine use, specifically all types of cancer, heart disease, stroke and respiratory failure. The options available for help with cessation ,the behavioral changes to assist the process, and the option to either gradully reduce usage  Or abruptly stop.is also discussed. Pt is also encouraged to set specific goals in number of cigarettes used daily, as well as to set a quit date.     Type 2 diabetes mellitus with vascular disease (HCC) Controlled, no change in medication Dakota Mendez is reminded of the importance of commitment to daily physical activity for 30 minutes or more, as able and the need to limit carbohydrate intake to 30 to 60 grams per meal to help with blood sugar control.   The need to take medication as prescribed, test blood sugar as directed, and to call between visits if there is a concern that blood sugar is uncontrolled is also discussed.   Dakota Mendez is reminded of the importance of daily foot exam, annual eye examination, and good blood sugar, blood pressure and cholesterol control.  Diabetic Labs Latest Ref Rng & Units 05/31/2016 11/27/2015 07/19/2015 12/23/2014 06/16/2014  HbA1c <5.7 % 6.6(H) 6.3(H) 7.2(H) 6.9(H)  7.3(H)  Microalbumin 0.00 - 1.89 mg/dL - - - - -  Micro/Creat Ratio 0.0 - 30.0 mg/g - - - - -  Chol 125 - 200 mg/dL 175 166 170 168 159  HDL >=40 mg/dL  42 42 33(L) 40 41  Calc LDL <130 mg/dL 100 91 91 89 72  Triglycerides <150 mg/dL 165(H) 163(H) 230(H) 197(H) 228(H)  Creatinine 0.70 - 1.33 mg/dL 0.98 1.03 - 0.99 1.15   BP/Weight 06/01/2016 03/06/2016 11/29/2015 09/02/2015 07/22/2015 04/01/2015 [id number]  Systolic BP [id number] 0000000 [id number] 0000000 123456 0000000 [id number]  Diastolic BP 90 82 92 88 84 88 92  Wt. (Lbs) 299 - 299 301.8 307 - 300  BMI 40.55 - 40.54 40.92 41.63 - 40.68   Foot/eye exam completion dates Latest Ref Rng & Units 06/01/2016 04/01/2015  Eye Exam No Retinopathy - -  Foot exam Order - - -  Foot Form Completion - Done Done

## 2016-06-01 NOTE — Assessment & Plan Note (Signed)
After obtaining informed consent, the vaccine is  administered by LPN.  

## 2016-06-01 NOTE — Assessment & Plan Note (Signed)

## 2016-06-01 NOTE — Assessment & Plan Note (Signed)
Controlled, no change in medication Dakota Mendez is reminded of the importance of commitment to daily physical activity for 30 minutes or more, as able and the need to limit carbohydrate intake to 30 to 60 grams per meal to help with blood sugar control.   The need to take medication as prescribed, test blood sugar as directed, and to call between visits if there is a concern that blood sugar is uncontrolled is also discussed.   Dakota Mendez is reminded of the importance of daily foot exam, annual eye examination, and good blood sugar, blood pressure and cholesterol control.  Diabetic Labs Latest Ref Rng & Units 05/31/2016 11/27/2015 07/19/2015 12/23/2014 06/16/2014  HbA1c <5.7 % 6.6(H) 6.3(H) 7.2(H) 6.9(H) 7.3(H)  Microalbumin 0.00 - 1.89 mg/dL - - - - -  Micro/Creat Ratio 0.0 - 30.0 mg/g - - - - -  Chol 125 - 200 mg/dL 175 166 170 168 159  HDL >=40 mg/dL 42 42 33(L) 40 41  Calc LDL <130 mg/dL 100 91 91 89 72  Triglycerides <150 mg/dL 165(H) 163(H) 230(H) 197(H) 228(H)  Creatinine 0.70 - 1.33 mg/dL 0.98 1.03 - 0.99 1.15   BP/Weight 06/01/2016 03/06/2016 11/29/2015 09/02/2015 07/22/2015 04/01/2015 [id number]  Systolic BP [id number] 0000000 [id number] 0000000 123456 0000000 [id number]  Diastolic BP 90 82 92 88 84 88 92  Wt. (Lbs) 299 - 299 301.8 307 - 300  BMI 40.55 - 40.54 40.92 41.63 - 40.68   Foot/eye exam completion dates Latest Ref Rng & Units 06/01/2016 04/01/2015  Eye Exam No Retinopathy - -  Foot exam Order - - -  Foot Form Completion - Done Done

## 2016-06-02 ENCOUNTER — Encounter (INDEPENDENT_AMBULATORY_CARE_PROVIDER_SITE_OTHER): Payer: Self-pay | Admitting: *Deleted

## 2016-06-05 ENCOUNTER — Encounter: Payer: Commercial Managed Care - HMO | Admitting: Family Medicine

## 2016-09-05 ENCOUNTER — Ambulatory Visit: Payer: Commercial Managed Care - HMO | Admitting: Internal Medicine

## 2016-09-28 ENCOUNTER — Ambulatory Visit: Payer: Commercial Managed Care - HMO

## 2016-09-29 ENCOUNTER — Ambulatory Visit: Payer: Commercial Managed Care - HMO

## 2016-10-02 ENCOUNTER — Ambulatory Visit: Payer: Commercial Managed Care - HMO | Admitting: Family Medicine

## 2016-10-05 DIAGNOSIS — E1159 Type 2 diabetes mellitus with other circulatory complications: Secondary | ICD-10-CM | POA: Diagnosis not present

## 2016-10-05 DIAGNOSIS — E785 Hyperlipidemia, unspecified: Secondary | ICD-10-CM | POA: Diagnosis not present

## 2016-10-06 ENCOUNTER — Encounter: Payer: Self-pay | Admitting: Family Medicine

## 2016-10-06 ENCOUNTER — Ambulatory Visit (INDEPENDENT_AMBULATORY_CARE_PROVIDER_SITE_OTHER): Payer: Medicare HMO | Admitting: Family Medicine

## 2016-10-06 VITALS — BP 126/88 | HR 105 | Resp 16 | Ht 72.0 in | Wt 295.0 lb

## 2016-10-06 DIAGNOSIS — E785 Hyperlipidemia, unspecified: Secondary | ICD-10-CM | POA: Diagnosis not present

## 2016-10-06 DIAGNOSIS — Z125 Encounter for screening for malignant neoplasm of prostate: Secondary | ICD-10-CM

## 2016-10-06 DIAGNOSIS — F17210 Nicotine dependence, cigarettes, uncomplicated: Secondary | ICD-10-CM | POA: Diagnosis not present

## 2016-10-06 DIAGNOSIS — Z6841 Body Mass Index (BMI) 40.0 and over, adult: Secondary | ICD-10-CM | POA: Diagnosis not present

## 2016-10-06 DIAGNOSIS — E559 Vitamin D deficiency, unspecified: Secondary | ICD-10-CM

## 2016-10-06 DIAGNOSIS — I10 Essential (primary) hypertension: Secondary | ICD-10-CM | POA: Diagnosis not present

## 2016-10-06 DIAGNOSIS — E1159 Type 2 diabetes mellitus with other circulatory complications: Secondary | ICD-10-CM

## 2016-10-06 DIAGNOSIS — F17200 Nicotine dependence, unspecified, uncomplicated: Secondary | ICD-10-CM

## 2016-10-06 LAB — COMPLETE METABOLIC PANEL WITH GFR
ALK PHOS: 66 U/L (ref 40–115)
ALT: 11 U/L (ref 9–46)
AST: 11 U/L (ref 10–35)
Albumin: 3.8 g/dL (ref 3.6–5.1)
BILIRUBIN TOTAL: 0.7 mg/dL (ref 0.2–1.2)
BUN: 21 mg/dL (ref 7–25)
CALCIUM: 9.8 mg/dL (ref 8.6–10.3)
CO2: 29 mmol/L (ref 20–31)
CREATININE: 0.93 mg/dL (ref 0.70–1.33)
Chloride: 101 mmol/L (ref 98–110)
Glucose, Bld: 112 mg/dL — ABNORMAL HIGH (ref 65–99)
Potassium: 4.6 mmol/L (ref 3.5–5.3)
Sodium: 140 mmol/L (ref 135–146)
TOTAL PROTEIN: 7 g/dL (ref 6.1–8.1)

## 2016-10-06 LAB — LIPID PANEL
CHOLESTEROL: 168 mg/dL (ref <200)
HDL: 41 mg/dL (ref >40)
LDL Cholesterol: 94 mg/dL (ref <100)
TRIGLYCERIDES: 165 mg/dL — AB (ref <150)
Total CHOL/HDL Ratio: 4.1 Ratio (ref <5.0)
VLDL: 33 mg/dL — ABNORMAL HIGH (ref <30)

## 2016-10-06 LAB — HEMOGLOBIN A1C
HEMOGLOBIN A1C: 6.1 % — AB (ref <5.7)
MEAN PLASMA GLUCOSE: 128 mg/dL

## 2016-10-06 NOTE — Patient Instructions (Addendum)
F/u in 4 month, call if you need me sooner  Congrats on improved health, weight loss and improved blood sugar  Reduce cigarettes by one each month so start 6 / day, need to QUIT  CBC, fasting lipid, cmp and EGFR, HBA1C,tSH, vit D and PSA in 4 month Weight loss goal of 12 to 16 pounds  It is important that you exercise regularly at least 30 minutes 5 times a week. If you develop chest pain, have severe difficulty breathing, or feel very tired, stop exercising immediately and seek medical attention    Please work on good  health habits so that your health will improve. 1. Commitment to daily physical activity for 30 to 60  minutes, if you are able to do this.  2. Commitment to wise food choices. Aim for half of your  food intake to be vegetable and fruit, one quarter starchy foods, and one quarter protein. Try to eat on a regular schedule  3 meals per day, snacking between meals should be limited to vegetables or fruits or small portions of nuts. 64 ounces of water per day is generally recommended, unless you have specific health conditions, like heart failure or kidney failure where you will need to limit fluid intake.  3. Commitment to sufficient and a  good quality of physical and mental rest daily, generally between 6 to 8 hours per day.  WITH PERSISTANCE AND PERSEVERANCE, THE IMPOSSIBLE , BECOMES THE NORM!    Steps to Quit Smoking Smoking tobacco can be bad for your health. It can also affect almost every organ in your body. Smoking puts you and people around you at risk for many serious long-lasting (chronic) diseases. Quitting smoking is hard, but it is one of the best things that you can do for your health. It is never too late to quit. What are the benefits of quitting smoking? When you quit smoking, you lower your risk for getting serious diseases and conditions. They can include:  Lung cancer or lung disease.  Heart disease.  Stroke.  Heart attack.  Not being able to  have children (infertility).  Weak bones (osteoporosis) and broken bones (fractures). If you have coughing, wheezing, and shortness of breath, those symptoms may get better when you quit. You may also get sick less often. If you are pregnant, quitting smoking can help to lower your chances of having a baby of low birth weight. What can I do to help me quit smoking? Talk with your doctor about what can help you quit smoking. Some things you can do (strategies) include:  Quitting smoking totally, instead of slowly cutting back how much you smoke over a period of time.  Going to in-person counseling. You are more likely to quit if you go to many counseling sessions.  Using resources and support systems, such as:  Online chats with a Social worker.  Phone quitlines.  Printed Furniture conservator/restorer.  Support groups or group counseling.  Text messaging programs.  Mobile phone apps or applications.  Taking medicines. Some of these medicines may have nicotine in them. If you are pregnant or breastfeeding, do not take any medicines to quit smoking unless your doctor says it is okay. Talk with your doctor about counseling or other things that can help you. Talk with your doctor about using more than one strategy at the same time, such as taking medicines while you are also going to in-person counseling. This can help make quitting easier. What things can I do to make it  easier to quit? Quitting smoking might feel very hard at first, but there is a lot that you can do to make it easier. Take these steps:  Talk to your family and friends. Ask them to support and encourage you.  Call phone quitlines, reach out to support groups, or work with a Social worker.  Ask people who smoke to not smoke around you.  Avoid places that make you want (trigger) to smoke, such as:  Bars.  Parties.  Smoke-break areas at work.  Spend time with people who do not smoke.  Lower the stress in your life. Stress can  make you want to smoke. Try these things to help your stress:  Getting regular exercise.  Deep-breathing exercises.  Yoga.  Meditating.  Doing a body scan. To do this, close your eyes, focus on one area of your body at a time from head to toe, and notice which parts of your body are tense. Try to relax the muscles in those areas.  Download or buy apps on your mobile phone or tablet that can help you stick to your quit plan. There are many free apps, such as QuitGuide from the [employer] Office manager for Disease Control and Prevention). You can find more support from smokefree.gov and other websites. This information is not intended to replace advice given to you by your health care provider. Make sure you discuss any questions you have with your health care provider. Document Released: 06/24/2009 Document Revised: 04/25/2016 Document Reviewed: 01/12/2015 Elsevier Interactive Patient Education  2017 [employer].

## 2016-10-07 ENCOUNTER — Encounter: Payer: Self-pay | Admitting: Family Medicine

## 2016-10-07 NOTE — Assessment & Plan Note (Signed)
Improved Controlled, no change in medication Dakota Mendez is reminded of the importance of commitment to daily physical activity for 30 minutes or more, as able and the need to limit carbohydrate intake to 30 to 60 grams per meal to help with blood sugar control.   The need to take medication as prescribed, test blood sugar as directed, and to call between visits if there is a concern that blood sugar is uncontrolled is also discussed.   Dakota Mendez is reminded of the importance of daily foot exam, annual eye examination, and good blood sugar, blood pressure and cholesterol control.  Diabetic Labs Latest Ref Rng & Units 10/05/2016 05/31/2016 11/27/2015 07/19/2015 12/23/2014  HbA1c <5.7 % 6.1(H) 6.6(H) 6.3(H) 7.2(H) 6.9(H)  Microalbumin 0.00 - 1.89 mg/dL - - - - -  Micro/Creat Ratio 0.0 - 30.0 mg/g - - - - -  Chol <200 mg/dL 168 175 166 170 168  HDL >40 mg/dL 41 42 42 33(L) 40  Calc LDL <100 mg/dL 94 100 91 91 89  Triglycerides <150 mg/dL 165(H) 165(H) 163(H) 230(H) 197(H)  Creatinine 0.70 - 1.33 mg/dL 0.93 0.98 1.03 - 0.99   BP/Weight 10/06/2016 06/01/2016 03/06/2016 11/29/2015 09/02/2015 07/22/2015 123456  Systolic BP 123XX123 [id number] 0000000 [id number] 0000000 123456 0000000  Diastolic BP 88 90 82 92 88 84 88  Wt. (Lbs) 295 299 - 299 301.8 307 -  BMI 40.01 40.55 - 40.54 40.92 41.63 -   Foot/eye exam completion dates Latest Ref Rng & Units 06/01/2016 04/01/2015  Eye Exam No Retinopathy - -  Foot exam Order - - -  Foot Form Completion - Done Done

## 2016-10-07 NOTE — Assessment & Plan Note (Signed)
Controlled, no change in medication DASH diet and commitment to daily physical activity for a minimum of 30 minutes discussed and encouraged, as a part of hypertension management. The importance of attaining a healthy weight is also discussed.  BP/Weight 10/06/2016 06/01/2016 03/06/2016 11/29/2015 09/02/2015 07/22/2015 123456  Systolic BP 123XX123 [id number] 0000000 [id number] 0000000 123456 0000000  Diastolic BP 88 90 82 92 88 84 88  Wt. (Lbs) 295 299 - 299 301.8 307 -  BMI 40.01 40.55 - 40.54 40.92 41.63 -

## 2016-10-07 NOTE — Assessment & Plan Note (Signed)
Hyperlipidemia:Low fat diet discussed and encouraged.   Lipid Panel  Lab Results  Component Value Date   CHOL 168 10/05/2016   HDL 41 10/05/2016   LDLCALC 94 10/05/2016   TRIG 165 (H) 10/05/2016   CHOLHDL 4.1 10/05/2016   Need to increaase exercise and reduce fried and fatty food, esp cheese

## 2016-10-07 NOTE — Progress Notes (Signed)
Dakota Mendez     MRN: [id number]      DOB: September 30, 1960   HPI Dakota Mendez is here for follow up and re-evaluation of chronic medical conditions, medication management and review of any available recent lab and radiology data.  Preventive health is updated, specifically  Cancer screening and Immunization.   Questions or concerns regarding consultations or procedures which the PT has had in the interim are  addressed. The PT denies any adverse reactions to current medications since the last visit.  There are no new concerns.  There are no specific complaints  Denies polyuria, polydipsia, blurred vision , or hypoglycemic episodes.   ROS Denies recent fever or chills. Denies sinus pressure, nasal congestion, ear pain or sore throat. Denies chest congestion, productive cough or wheezing. Denies chest pains, palpitations and leg swelling Denies abdominal pain, nausea, vomiting,diarrhea or constipation.   Denies dysuria, frequency, hesitancy or incontinence. Denies uncontrolled  joint pain, swelling and limitation in mobility. Denies headaches, seizures, numbness, or tingling. Denies depression, anxiety or insomnia. Denies skin break down or rash.   PE  BP 126/88   Pulse (!) 105   Resp 16   Ht 6' (1.829 m)   Wt 295 lb (133.8 kg)   SpO2 98%   BMI 40.01 kg/m   Patient alert and oriented and in no cardiopulmonary distress.  HEENT: No facial asymmetry, EOMI,   oropharynx pink and moist.  Neck supple no JVD, no mass.  Chest: Clear to auscultation bilaterally.  CVS: S1, S2 no murmurs, no S3.Regular rate.  ABD: Soft non tender.   Ext: No edema  MS: Adequate ROM spine, shoulders, hips and knees.  Skin: Intact, no ulcerations or rash noted.  Psych: Good eye contact, normal affect. Memory intact not anxious or depressed appearing.  CNS: CN 2-12 intact, power,  normal throughout.no focal deficits noted.   Assessment & Plan  Essential hypertension Controlled, no change  in medication DASH diet and commitment to daily physical activity for a minimum of 30 minutes discussed and encouraged, as a part of hypertension management. The importance of attaining a healthy weight is also discussed.  BP/Weight 10/06/2016 06/01/2016 03/06/2016 11/29/2015 09/02/2015 07/22/2015 123456  Systolic BP 123XX123 [id number] 0000000 [id number] 0000000 123456 0000000  Diastolic BP 88 90 82 92 88 84 88  Wt. (Lbs) 295 299 - 299 301.8 307 -  BMI 40.01 40.55 - 40.54 40.92 41.63 -       Type 2 diabetes mellitus with vascular disease (HCC) Improved Controlled, no change in medication Dakota Mendez is reminded of the importance of commitment to daily physical activity for 30 minutes or more, as able and the need to limit carbohydrate intake to 30 to 60 grams per meal to help with blood sugar control.   The need to take medication as prescribed, test blood sugar as directed, and to call between visits if there is a concern that blood sugar is uncontrolled is also discussed.   Dakota Mendez is reminded of the importance of daily foot exam, annual eye examination, and good blood sugar, blood pressure and cholesterol control.  Diabetic Labs Latest Ref Rng & Units 10/05/2016 05/31/2016 11/27/2015 07/19/2015 12/23/2014  HbA1c <5.7 % 6.1(H) 6.6(H) 6.3(H) 7.2(H) 6.9(H)  Microalbumin 0.00 - 1.89 mg/dL - - - - -  Micro/Creat Ratio 0.0 - 30.0 mg/g - - - - -  Chol <200 mg/dL 168 175 166 170 168  HDL >40 mg/dL 41 42 42 33(L) 40  Calc LDL <100 mg/dL  94 100 91 91 89  Triglycerides <150 mg/dL 165(H) 165(H) 163(H) 230(H) 197(H)  Creatinine 0.70 - 1.33 mg/dL 0.93 0.98 1.03 - 0.99   BP/Weight 10/06/2016 06/01/2016 03/06/2016 11/29/2015 09/02/2015 07/22/2015 123456  Systolic BP 123XX123 [id number] 0000000 [id number] 0000000 123456 0000000  Diastolic BP 88 90 82 92 88 84 88  Wt. (Lbs) 295 299 - 299 301.8 307 -  BMI 40.01 40.55 - 40.54 40.92 41.63 -   Foot/eye exam completion dates Latest Ref Rng & Units 06/01/2016 04/01/2015  Eye Exam No Retinopathy - -  Foot exam Order  - - -  Foot Form Completion - Done Done        TOBACCO ABUSE Patient counseled for approximately 5 minutes regarding the health risks of ongoing nicotine use, specifically all types of cancer, heart disease, stroke and respiratory failure. The options available for help with cessation ,the behavioral changes to assist the process, and the option to either gradully reduce usage  Or abruptly stop.is also discussed. Pt is also encouraged to set specific goals in number of cigarettes used daily, as well as to set a quit date.     Morbid obesity with body mass index of 40.0-44.9 in adult Improved. Patient re-educated about  the importance of commitment to a  minimum of 150 minutes of exercise per week.  The importance of healthy food choices with portion control discussed. Encouraged to start a food diary, count calories and to consider  joining a support group. Sample diet sheets offered. Goals set by the patient for the next several months.   Weight /BMI 10/06/2016 06/01/2016 11/29/2015  WEIGHT 295 lb 299 lb 299 lb  HEIGHT 6\' 0"  6\' 0"  6\' 0"   BMI 40.01 kg/m2 40.55 kg/m2 40.54 kg/m2      Hyperlipidemia LDL goal <100 Hyperlipidemia:Low fat diet discussed and encouraged.   Lipid Panel  Lab Results  Component Value Date   CHOL 168 10/05/2016   HDL 41 10/05/2016   LDLCALC 94 10/05/2016   TRIG 165 (H) 10/05/2016   CHOLHDL 4.1 10/05/2016   Need to increaase exercise and reduce fried and fatty food, esp cheese

## 2016-10-07 NOTE — Assessment & Plan Note (Addendum)
Improved. Patient re-educated about  the importance of commitment to a  minimum of 150 minutes of exercise per week.  The importance of healthy food choices with portion control discussed. Encouraged to start a food diary, count calories and to consider  joining a support group. Sample diet sheets offered. Goals set by the patient for the next several months.   Weight /BMI 10/06/2016 06/01/2016 11/29/2015  WEIGHT 295 lb 299 lb 299 lb  HEIGHT 6\' 0"  6\' 0"  6\' 0"   BMI 40.01 kg/m2 40.55 kg/m2 40.54 kg/m2

## 2016-10-07 NOTE — Assessment & Plan Note (Signed)

## 2016-10-11 ENCOUNTER — Other Ambulatory Visit: Payer: Self-pay | Admitting: Family Medicine

## 2016-10-11 DIAGNOSIS — I10 Essential (primary) hypertension: Secondary | ICD-10-CM

## 2016-11-13 ENCOUNTER — Ambulatory Visit: Payer: Commercial Managed Care - HMO | Admitting: Internal Medicine

## 2016-12-27 ENCOUNTER — Ambulatory Visit: Payer: Medicare HMO

## 2017-01-08 ENCOUNTER — Ambulatory Visit (INDEPENDENT_AMBULATORY_CARE_PROVIDER_SITE_OTHER): Payer: Medicare HMO

## 2017-01-08 VITALS — BP 138/90 | HR 100 | Temp 98.0°F | Ht 72.0 in | Wt 299.1 lb

## 2017-01-08 DIAGNOSIS — Z0000 Encounter for general adult medical examination without abnormal findings: Secondary | ICD-10-CM

## 2017-01-08 DIAGNOSIS — Z716 Tobacco abuse counseling: Secondary | ICD-10-CM | POA: Diagnosis not present

## 2017-01-08 DIAGNOSIS — Z720 Tobacco use: Secondary | ICD-10-CM | POA: Diagnosis not present

## 2017-01-08 NOTE — Progress Notes (Signed)
Subjective:   Dakota Mendez is a 56 y.o. male who presents for Medicare Annual/Subsequent preventive examination.  Review of Systems:  Cardiac Risk Factors include: diabetes mellitus;dyslipidemia;hypertension;male gender;obesity (BMI >30kg/m2);sedentary lifestyle;smoking/ tobacco exposure     Objective:    Vitals: BP 138/90   Pulse 100   Temp 98 F (36.7 C) (Oral)   Ht 6' (1.829 m)   Wt 299 lb 1.9 oz (135.7 kg)   SpO2 97%   BMI 40.57 kg/m   Body mass index is 40.57 kg/m.  Tobacco History  Smoking Status  . Current Every Day Smoker  . Packs/day: 0.50  . Years: 40.00  . Types: Cigarettes  Smokeless Tobacco  . Never Used    Comment: 1 PPD for 35 years     Ready to quit: Yes Counseling given: Yes   Past Medical History:  Diagnosis Date  . Chronic back pain   . Chronic neck pain   . Diabetes mellitus   . Hyperlipidemia   . Hypertension   . Obesity   . Tobacco abuse    Past Surgical History:  Procedure Laterality Date  . SPINE SURGERY  09/21/2011   cervial spine   Family History  Problem Relation Age of Onset  . Cirrhosis Father   . Heart attack Mother   . Depression Mother   . Stroke Mother   . Hearing loss Brother   . Cirrhosis Brother   . Diabetes Sister   . Sleep apnea Son   . Hypertension Son    History  Sexual Activity  . Sexual activity: Yes    Outpatient Encounter Prescriptions as of 01/08/2017  Medication Sig  . amLODipine (NORVASC) 10 MG tablet TAKE 1 TABLET EVERY DAY  . cloNIDine (CATAPRES) 0.2 MG tablet TAKE 1 TABLET (0.2 MG TOTAL) BY MOUTH DAILY.  Marland Kitchen glipiZIDE (GLUCOTROL XL) 10 MG 24 hr tablet TAKE 1 TABLET EVERY DAY WITH BREAKFAST  . glucose blood (ONE TOUCH ULTRA TEST) test strip Use as instructed for once daily testing  Dx 250.00 length of need- 99 months  . losartan-hydrochlorothiazide (HYZAAR) 100-25 MG tablet TAKE 1 TABLET EVERY DAY  . metFORMIN (GLUCOPHAGE) 1000 MG tablet TAKE 1 TABLET TWICE DAILY WITH A MEAL  . ONETOUCH  DELICA LANCETS 54Y MISC 1 each by Does not apply route daily. For once daily testing DX 250.00 Length of need- 99 months  . pravastatin (PRAVACHOL) 20 MG tablet TAKE 1 TABLET EVERY DAY   No facility-administered encounter medications on file as of 01/08/2017.     Activities of Daily Living In your present state of health, do you have any difficulty performing the following activities: 01/08/2017  Hearing? N  Vision? N  Difficulty concentrating or making decisions? N  Walking or climbing stairs? Y  Dressing or bathing? N  Doing errands, shopping? N  Preparing Food and eating ? N  Using the Toilet? N  In the past six months, have you accidently leaked urine? N  Do you have problems with loss of bowel control? N  Managing your Medications? N  Managing your Finances? N  Housekeeping or managing your Housekeeping? N  Some recent data might be hidden    Patient Care Team: Fayrene Helper, MD as PCP - General   Assessment:    Exercise Activities and Dietary recommendations Current Exercise Habits: The patient does not participate in regular exercise at present, Exercise limited by: None identified  Goals    . Exercise 3x per week (30 min per time)  Recommend starting a routine exercise program at least 3 days a week for 30-45 minutes at a time as tolerated.        Fall Risk Fall Risk  01/08/2017 11/29/2015 08/24/2015 02/17/2015 04/18/2013  Falls in the past year? Yes Yes No No No  Number falls in past yr: 2 or more 2 or more - - -  Injury with Fall? No No - - -  Risk for fall due to : Impaired balance/gait - Impaired mobility;Impaired balance/gait Impaired mobility;Impaired balance/gait -  Follow up Education provided;Falls prevention discussed - - - -   Depression Screen PHQ 2/9 Scores 01/08/2017 10/06/2016 08/24/2015 02/17/2015  PHQ - 2 Score 0 0 0 0  PHQ- 9 Score - - - -    Cognitive Function: Normal   6CIT Screen 01/08/2017  What Year? 0 points  What month? 0  points  What time? 0 points  Count back from 20 0 points  Months in reverse 0 points  Repeat phrase 0 points  Total Score 0    Immunization History  Administered Date(s) Administered  . Influenza Whole 10/11/2007, 05/17/2011  . Influenza,inj,Quad PF,36+ Mos 06/13/2013, 06/18/2014, 07/22/2015, 06/01/2016  . Pneumococcal Conjugate-13 12/15/2014  . Pneumococcal Polysaccharide-23 04/05/2011, 06/01/2016  . Td 09/21/2006   Screening Tests Health Maintenance  Topic Date Due  . COLONOSCOPY  03/30/2013  . OPHTHALMOLOGY EXAM  02/17/2016  . TETANUS/TDAP  10/25/2017 (Originally 09/21/2016)  . HEMOGLOBIN A1C  04/04/2017  . INFLUENZA VACCINE  04/11/2017  . FOOT EXAM  06/01/2017  . PNEUMOCOCCAL POLYSACCHARIDE VACCINE  Completed  . Hepatitis C Screening  Completed  . HIV Screening  Completed      Plan:   I have personally reviewed and noted the following in the patient's chart:   . Medical and social history . Use of alcohol, tobacco or illicit drugs  . Current medications and supplements . Functional ability and status . Nutritional status . Physical activity . Advanced directives . List of other physicians . Hospitalizations, surgeries, and ER visits in previous 12 months . Vitals . Screenings to include cognitive, depression, and falls . Referrals and appointments- We will schedule your diabetic eye exam as well as your consult with Dr. Laural Golden for your colonoscopy. Community resource referral sent today for dental care, and to help you pay for a CPAP machine.  In addition, I have reviewed and discussed with patient certain preventive protocols, quality metrics, and best practice recommendations. A written personalized care plan for preventive services as well as general preventive health recommendations were provided to patient.     Stormy Fabian, LPN  6/41/5830

## 2017-01-08 NOTE — Patient Instructions (Addendum)
Mr. Stripling , Thank you for taking time to come for your Medicare Wellness Visit. I appreciate your ongoing commitment to your health goals. Please review the following plan we discussed and let me know if I can assist you in the future.   Screening recommendations/referrals: Community resource referral sent in today to help you find assistance with dental care and a CPAP machine. You should receive a call from our care guide Elmyra Ricks within the next month.  Colonoscopy: Due now, we will call Dr. Olevia Perches office and call you with your appointment time. Diabetic Eye Exam: Overdue, we will call Dr. Kellie Moor office to set up an appointment for you and call you with your appointment date and time.  Recommended yearly dental visit for hygiene and checkup  Vaccinations: Influenza vaccine: Up to date, next due 04/2017 Pneumococcal vaccine: Up to date, next due at age 85 Tdap vaccine: Overdue, declines Shingles vaccine: Due at age 75    Advanced directives: Advance directive discussed with you today. I have provided a copy for you to complete at home and have notarized. Once this is complete please bring a copy in to our office so we can scan it into your chart.  Conditions/risks identified: Obese, recommend starting a routine exercise program at least 3 days a week for 30-45 minutes at a time as tolerated.   Next appointment: Follow up with Dr. Moshe Cipro on 02/06/2017 at 8:20 am. Follow up in 1 year for your annual wellness visit.  Preventive Care 40-64 Years, Male Preventive care refers to lifestyle choices and visits with your health care provider that can promote health and wellness. What does preventive care include?  A yearly physical exam. This is also called an annual well check.  Dental exams once or twice a year.  Routine eye exams. Ask your health care provider how often you should have your eyes checked.  Personal lifestyle choices, including:  Daily care of your teeth and  gums.  Regular physical activity.  Eating a healthy diet.  Avoiding tobacco and drug use.  Limiting alcohol use.  Practicing safe sex.  Taking low-dose aspirin every day starting at age 66. What happens during an annual well check? The services and screenings done by your health care provider during your annual well check will depend on your age, overall health, lifestyle risk factors, and family history of disease. Counseling  Your health care provider may ask you questions about your:  Alcohol use.  Tobacco use.  Drug use.  Emotional well-being.  Home and relationship well-being.  Sexual activity.  Eating habits.  Work and work Statistician. Screening  You may have the following tests or measurements:  Height, weight, and BMI.  Blood pressure.  Lipid and cholesterol levels. These may be checked every 5 years, or more frequently if you are over 70 years old.  Skin check.  Lung cancer screening. You may have this screening every year starting at age 46 if you have a 30-pack-year history of smoking and currently smoke or have quit within the past 15 years.  Fecal occult blood test (FOBT) of the stool. You may have this test every year starting at age 7.  Flexible sigmoidoscopy or colonoscopy. You may have a sigmoidoscopy every 5 years or a colonoscopy every 10 years starting at age 4.  Prostate cancer screening. Recommendations will vary depending on your family history and other risks.  Hepatitis C blood test.  Hepatitis B blood test.  Sexually transmitted disease (STD) testing.  Diabetes screening.  This is done by checking your blood sugar (glucose) after you have not eaten for a while (fasting). You may have this done every 1-3 years. Discuss your test results, treatment options, and if necessary, the need for more tests with your health care provider. Vaccines  Your health care provider may recommend certain vaccines, such as:  Influenza vaccine.  This is recommended every year.  Tetanus, diphtheria, and acellular pertussis (Tdap, Td) vaccine. You may need a Td booster every 10 years.  Zoster vaccine. You may need this after age 17.  Pneumococcal 13-valent conjugate (PCV13) vaccine. You may need this if you have certain conditions and have not been vaccinated.  Pneumococcal polysaccharide (PPSV23) vaccine. You may need one or two doses if you smoke cigarettes or if you have certain conditions. Talk to your health care provider about which screenings and vaccines you need and how often you need them. This information is not intended to replace advice given to you by your health care provider. Make sure you discuss any questions you have with your health care provider. Document Released: 09/24/2015 Document Revised: 05/17/2016 Document Reviewed: 06/29/2015 Elsevier Interactive Patient Education  2017 [place] Prevention in the Home Falls can cause injuries. They can happen to people of all ages. There are many things you can do to make your home safe and to help prevent falls. What can I do on the outside of my home?  Regularly fix the edges of walkways and driveways and fix any cracks.  Remove anything that might make you trip as you walk through a door, such as a raised step or threshold.  Trim any bushes or trees on the path to your home.  Use bright outdoor lighting.  Clear any walking paths of anything that might make someone trip, such as rocks or tools.  Regularly check to see if handrails are loose or broken. Make sure that both sides of any steps have handrails.  Any raised decks and porches should have guardrails on the edges.  Have any leaves, snow, or ice cleared regularly.  Use sand or salt on walking paths during winter.  Clean up any spills in your garage right away. This includes oil or grease spills. What can I do in the bathroom?  Use night lights.  Install grab bars by the toilet and in the  tub and shower. Do not use towel bars as grab bars.  Use non-skid mats or decals in the tub or shower.  If you need to sit down in the shower, use a plastic, non-slip stool.  Keep the floor dry. Clean up any water that spills on the floor as soon as it happens.  Remove soap buildup in the tub or shower regularly.  Attach bath mats securely with double-sided non-slip rug tape.  Do not have throw rugs and other things on the floor that can make you trip. What can I do in the bedroom?  Use night lights.  Make sure that you have a light by your bed that is easy to reach.  Do not use any sheets or blankets that are too big for your bed. They should not hang down onto the floor.  Have a firm chair that has side arms. You can use this for support while you get dressed.  Do not have throw rugs and other things on the floor that can make you trip. What can I do in the kitchen?  Clean up any spills right away.  Avoid walking  on wet floors.  Keep items that you use a lot in easy-to-reach places.  If you need to reach something above you, use a strong step stool that has a grab bar.  Keep electrical cords out of the way.  Do not use floor polish or wax that makes floors slippery. If you must use wax, use non-skid floor wax.  Do not have throw rugs and other things on the floor that can make you trip. What can I do with my stairs?  Do not leave any items on the stairs.  Make sure that there are handrails on both sides of the stairs and use them. Fix handrails that are broken or loose. Make sure that handrails are as long as the stairways.  Check any carpeting to make sure that it is firmly attached to the stairs. Fix any carpet that is loose or worn.  Avoid having throw rugs at the top or bottom of the stairs. If you do have throw rugs, attach them to the floor with carpet tape.  Make sure that you have a light switch at the top of the stairs and the bottom of the stairs. If you  do not have them, ask someone to add them for you. What else can I do to help prevent falls?  Wear shoes that:  Do not have high heels.  Have rubber bottoms.  Are comfortable and fit you well.  Are closed at the toe. Do not wear sandals.  If you use a stepladder:  Make sure that it is fully opened. Do not climb a closed stepladder.  Make sure that both sides of the stepladder are locked into place.  Ask someone to hold it for you, if possible.  Clearly mark and make sure that you can see:  Any grab bars or handrails.  First and last steps.  Where the edge of each step is.  Use tools that help you move around (mobility aids) if they are needed. These include:  Canes.  Walkers.  Scooters.  Crutches.  Turn on the lights when you go into a dark area. Replace any light bulbs as soon as they burn out.  Set up your furniture so you have a clear path. Avoid moving your furniture around.  If any of your floors are uneven, fix them.  If there are any pets around you, be aware of where they are.  Review your medicines with your doctor. Some medicines can make you feel dizzy. This can increase your chance of falling. Ask your doctor what other things that you can do to help prevent falls. This information is not intended to replace advice given to you by your health care provider. Make sure you discuss any questions you have with your health care provider. Document Released: 06/24/2009 Document Revised: 02/03/2016 Document Reviewed: 10/02/2014 Elsevier Interactive Patient Education  2017 [employer].  Steps to Quit Smoking Smoking tobacco can be bad for your health. It can also affect almost every organ in your body. Smoking puts you and people around you at risk for many serious long-lasting (chronic) diseases. Quitting smoking is hard, but it is one of the best things that you can do for your health. It is never too late to quit. What are the benefits of quitting  smoking? When you quit smoking, you lower your risk for getting serious diseases and conditions. They can include:  Lung cancer or lung disease.  Heart disease.  Stroke.  Heart attack.  Not being able to  have children (infertility).  Weak bones (osteoporosis) and broken bones (fractures). If you have coughing, wheezing, and shortness of breath, those symptoms may get better when you quit. You may also get sick less often. If you are pregnant, quitting smoking can help to lower your chances of having a baby of low birth weight. What can I do to help me quit smoking? Talk with your doctor about what can help you quit smoking. Some things you can do (strategies) include:  Quitting smoking totally, instead of slowly cutting back how much you smoke over a period of time.  Going to in-person counseling. You are more likely to quit if you go to many counseling sessions.  Using resources and support systems, such as:  Online chats with a Social worker.  Phone quitlines.  Printed Furniture conservator/restorer.  Support groups or group counseling.  Text messaging programs.  Mobile phone apps or applications.  Taking medicines. Some of these medicines may have nicotine in them. If you are pregnant or breastfeeding, do not take any medicines to quit smoking unless your doctor says it is okay. Talk with your doctor about counseling or other things that can help you. Talk with your doctor about using more than one strategy at the same time, such as taking medicines while you are also going to in-person counseling. This can help make quitting easier. What things can I do to make it easier to quit? Quitting smoking might feel very hard at first, but there is a lot that you can do to make it easier. Take these steps:  Talk to your family and friends. Ask them to support and encourage you.  Call phone quitlines, reach out to support groups, or work with a Social worker.  Ask people who smoke to not smoke  around you.  Avoid places that make you want (trigger) to smoke, such as:  Bars.  Parties.  Smoke-break areas at work.  Spend time with people who do not smoke.  Lower the stress in your life. Stress can make you want to smoke. Try these things to help your stress:  Getting regular exercise.  Deep-breathing exercises.  Yoga.  Meditating.  Doing a body scan. To do this, close your eyes, focus on one area of your body at a time from head to toe, and notice which parts of your body are tense. Try to relax the muscles in those areas.  Download or buy apps on your mobile phone or tablet that can help you stick to your quit plan. There are many free apps, such as QuitGuide from the [employer] Office manager for Disease Control and Prevention). You can find more support from smokefree.gov and other websites. This information is not intended to replace advice given to you by your health care provider. Make sure you discuss any questions you have with your health care provider. Document Released: 06/24/2009 Document Revised: 04/25/2016 Document Reviewed: 01/12/2015 Elsevier Interactive Patient Education  2017 [employer].

## 2017-02-06 ENCOUNTER — Ambulatory Visit: Payer: Medicare HMO

## 2017-02-06 ENCOUNTER — Ambulatory Visit: Payer: Medicare HMO | Admitting: Family Medicine

## 2017-02-13 DIAGNOSIS — Z125 Encounter for screening for malignant neoplasm of prostate: Secondary | ICD-10-CM | POA: Diagnosis not present

## 2017-02-13 DIAGNOSIS — E785 Hyperlipidemia, unspecified: Secondary | ICD-10-CM | POA: Diagnosis not present

## 2017-02-13 DIAGNOSIS — E1159 Type 2 diabetes mellitus with other circulatory complications: Secondary | ICD-10-CM | POA: Diagnosis not present

## 2017-02-13 DIAGNOSIS — E559 Vitamin D deficiency, unspecified: Secondary | ICD-10-CM | POA: Diagnosis not present

## 2017-02-13 DIAGNOSIS — I10 Essential (primary) hypertension: Secondary | ICD-10-CM | POA: Diagnosis not present

## 2017-02-13 LAB — TSH: TSH: 3.14 mIU/L (ref 0.40–4.50)

## 2017-02-13 LAB — CBC
HEMATOCRIT: 45.1 % (ref 38.5–50.0)
Hemoglobin: 14.8 g/dL (ref 13.2–17.1)
MCH: 28.6 pg (ref 27.0–33.0)
MCHC: 32.8 g/dL (ref 32.0–36.0)
MCV: 87.1 fL (ref 80.0–100.0)
MPV: 11 fL (ref 7.5–12.5)
Platelets: 390 10*3/uL (ref 140–400)
RBC: 5.18 MIL/uL (ref 4.20–5.80)
RDW: 14.2 % (ref 11.0–15.0)
WBC: 12.8 10*3/uL — ABNORMAL HIGH (ref 3.8–10.8)

## 2017-02-13 LAB — COMPLETE METABOLIC PANEL WITH GFR
ALT: 10 U/L (ref 9–46)
AST: 10 U/L (ref 10–35)
Albumin: 3.8 g/dL (ref 3.6–5.1)
Alkaline Phosphatase: 61 U/L (ref 40–115)
BUN: 16 mg/dL (ref 7–25)
CHLORIDE: 103 mmol/L (ref 98–110)
CO2: 28 mmol/L (ref 20–31)
Calcium: 9.3 mg/dL (ref 8.6–10.3)
Creat: 1.18 mg/dL (ref 0.70–1.33)
GFR, EST AFRICAN AMERICAN: 81 mL/min (ref >=60)
GFR, Est Non African American: 70 mL/min (ref >=60)
GLUCOSE: 135 mg/dL — AB (ref 65–99)
Potassium: 4.4 mmol/L (ref 3.5–5.3)
SODIUM: 141 mmol/L (ref 135–146)
TOTAL PROTEIN: 6.6 g/dL (ref 6.1–8.1)
Total Bilirubin: 0.6 mg/dL (ref 0.2–1.2)

## 2017-02-13 LAB — LIPID PANEL
Cholesterol: 164 mg/dL (ref <200)
HDL: 37 mg/dL — ABNORMAL LOW (ref >40)
LDL Cholesterol: 64 mg/dL (ref <100)
TRIGLYCERIDES: 317 mg/dL — AB (ref <150)
Total CHOL/HDL Ratio: 4.4 Ratio (ref <5.0)
VLDL: 63 mg/dL — AB (ref <30)

## 2017-02-13 LAB — PSA: PSA: 0.8 ng/mL (ref <=4.0)

## 2017-02-14 ENCOUNTER — Ambulatory Visit: Payer: Medicare HMO | Admitting: Family Medicine

## 2017-02-14 LAB — HEMOGLOBIN A1C
HEMOGLOBIN A1C: 6.6 % — AB (ref <5.7)
Mean Plasma Glucose: 143 mg/dL

## 2017-02-14 LAB — VITAMIN D 25 HYDROXY (VIT D DEFICIENCY, FRACTURES): Vit D, 25-Hydroxy: 30 ng/mL (ref 30–100)

## 2017-03-05 ENCOUNTER — Other Ambulatory Visit: Payer: Self-pay | Admitting: Family Medicine

## 2017-03-05 DIAGNOSIS — I10 Essential (primary) hypertension: Secondary | ICD-10-CM

## 2017-03-06 ENCOUNTER — Encounter: Payer: Self-pay | Admitting: Family Medicine

## 2017-03-13 ENCOUNTER — Encounter: Payer: Self-pay | Admitting: Family Medicine

## 2017-03-13 ENCOUNTER — Ambulatory Visit (INDEPENDENT_AMBULATORY_CARE_PROVIDER_SITE_OTHER): Payer: Medicare HMO | Admitting: Family Medicine

## 2017-03-13 VITALS — BP 130/82 | HR 100 | Temp 99.6°F | Resp 18 | Ht 72.0 in | Wt 297.5 lb

## 2017-03-13 DIAGNOSIS — F17200 Nicotine dependence, unspecified, uncomplicated: Secondary | ICD-10-CM | POA: Diagnosis not present

## 2017-03-13 DIAGNOSIS — H5203 Hypermetropia, bilateral: Secondary | ICD-10-CM | POA: Diagnosis not present

## 2017-03-13 DIAGNOSIS — I10 Essential (primary) hypertension: Secondary | ICD-10-CM | POA: Diagnosis not present

## 2017-03-13 DIAGNOSIS — Z6841 Body Mass Index (BMI) 40.0 and over, adult: Secondary | ICD-10-CM | POA: Diagnosis not present

## 2017-03-13 DIAGNOSIS — F17210 Nicotine dependence, cigarettes, uncomplicated: Secondary | ICD-10-CM

## 2017-03-13 DIAGNOSIS — Z1211 Encounter for screening for malignant neoplasm of colon: Secondary | ICD-10-CM | POA: Diagnosis not present

## 2017-03-13 DIAGNOSIS — E785 Hyperlipidemia, unspecified: Secondary | ICD-10-CM

## 2017-03-13 DIAGNOSIS — E119 Type 2 diabetes mellitus without complications: Secondary | ICD-10-CM | POA: Diagnosis not present

## 2017-03-13 DIAGNOSIS — E1159 Type 2 diabetes mellitus with other circulatory complications: Secondary | ICD-10-CM | POA: Diagnosis not present

## 2017-03-13 DIAGNOSIS — H524 Presbyopia: Secondary | ICD-10-CM | POA: Diagnosis not present

## 2017-03-13 LAB — HM DIABETES EYE EXAM

## 2017-03-13 MED ORDER — GLIPIZIDE ER 5 MG PO TB24: 5.0000 mg | ORAL_TABLET | Freq: Every day | ORAL | 1 refills | Status: DC

## 2017-03-13 NOTE — Patient Instructions (Addendum)
Annual physical in 4. months, call if you need me before  Stop glipizde 10 mg daily , start glipizide 5 mg daily,   It is important that you exercise regularly at least 30 minutes 5 times a week. If you develop chest pain, have severe difficulty breathing, or feel very tired, stop exercising immediately and seek medical attention   Please get colonoscopy referral is sent  NEED TO QUIT SMOKING call [place] repeatedly  pls cut back on butter , cheese, red meat , fried and fatty foods  Non fasting hBA1C, chem 7 and eGFR 1 week before next visit  Thank you  for choosing [facility]. We consider it a privelige to serve you.  Delivering excellent health care in a caring and  compassionate way is our goal.  Partnering with you,  so that together we can achieve this goal is our strategy.

## 2017-03-15 ENCOUNTER — Encounter (INDEPENDENT_AMBULATORY_CARE_PROVIDER_SITE_OTHER): Payer: Self-pay | Admitting: *Deleted

## 2017-03-17 ENCOUNTER — Encounter: Payer: Self-pay | Admitting: Family Medicine

## 2017-03-17 NOTE — Assessment & Plan Note (Signed)
Controlled, no change in medication DASH diet and commitment to daily physical activity for a minimum of 30 minutes discussed and encouraged, as a part of hypertension management. The importance of attaining a healthy weight is also discussed.  BP/Weight 03/13/2017 01/08/2017 10/06/2016 06/01/2016 03/06/2016 11/29/2015 59/13/6859  Systolic BP 923 414 436 [phone] 494  Diastolic BP 82 90 88 90 82 92 88  Wt. (Lbs) 297.5 299.12 295 299 - 299 301.8  BMI 40.35 40.57 40.01 40.55 - 40.54 40.92

## 2017-03-17 NOTE — Assessment & Plan Note (Signed)
Hyperlipidemia:Low fat diet discussed and encouraged.  Uncontrolled needs to reduce fat in diet Lipid Panel  Lab Results  Component Value Date   CHOL 164 02/13/2017   HDL 37 (L) 02/13/2017   LDLCALC 64 02/13/2017   TRIG 317 (H) 02/13/2017   CHOLHDL 4.4 02/13/2017

## 2017-03-17 NOTE — Progress Notes (Signed)
Dakota Mendez     MRN: [id number]      DOB: 1960-09-17   HPI Dakota Mendez is here for follow up and re-evaluation of chronic medical conditions, medication management and review of any available recent lab and radiology data.  Preventive health is updated, specifically  Cancer screening and Immunization.   Questions or concerns regarding consultations or procedures which the PT has had in the interim are  addressed. The PT denies any adverse reactions to current medications since the last visit.  There are no new concerns.  C/o episodes of low blood sugars when he gets weak and shaky  ROS Denies recent fever or chills. Denies sinus pressure, nasal congestion, ear pain or sore throat. Denies chest congestion, productive cough or wheezing. Denies chest pains, palpitations and leg swelling Denies abdominal pain, nausea, vomiting,diarrhea or constipation.   Denies dysuria, frequency, hesitancy or incontinence. Denies joint pain, swelling and limitation in mobility. Denies headaches, seizures, numbness, or tingling. Denies depression, anxiety or insomnia. Denies skin break down or rash.   PE  BP 130/82 (BP Location: Right Arm, Patient Position: Sitting)   Pulse 100   Temp 99.6 F (37.6 C)   Resp 18   Ht 6' (1.829 m)   Wt 297 lb 8 oz (134.9 kg)   SpO2 96%   BMI 40.35 kg/m   Patient alert and oriented and in no cardiopulmonary distress.  HEENT: No facial asymmetry, EOMI,   oropharynx pink and moist.  Neck supple no JVD, no mass.  Chest: Clear to auscultation bilaterally.  CVS: S1, S2 no murmurs, no S3.Regular rate.  ABD: Soft non tender.   Ext: No edema  MS: Adequate ROM spine, shoulders, hips and knees.  Skin: Intact, no ulcerations or rash noted.  Psych: Good eye contact, normal affect. Memory intact not anxious or depressed appearing.  CNS: CN 2-12 intact, power,  normal throughout.no focal deficits noted.   Assessment & Plan  Essential  hypertension Controlled, no change in medication DASH diet and commitment to daily physical activity for a minimum of 30 minutes discussed and encouraged, as a part of hypertension management. The importance of attaining a healthy weight is also discussed.  BP/Weight 03/13/2017 01/08/2017 10/06/2016 06/01/2016 03/06/2016 11/29/2015 03/04/7627  Systolic BP 315 176 160 737 106 269 485  Diastolic BP 82 90 88 90 82 92 88  Wt. (Lbs) 297.5 299.12 295 299 - 299 301.8  BMI 40.35 40.57 40.01 40.55 - 40.54 40.92       Hyperlipidemia LDL goal <100 Hyperlipidemia:Low fat diet discussed and encouraged.  Uncontrolled needs to reduce fat in diet Lipid Panel  Lab Results  Component Value Date   CHOL 164 02/13/2017   HDL 37 (L) 02/13/2017   LDLCALC 64 02/13/2017   TRIG 317 (H) 02/13/2017   CHOLHDL 4.4 02/13/2017       TOBACCO ABUSE Patient is asked and  confirms current  Nicotine use.  Five to seven minutes of time is spent in counseling the patient of the need to quit smoking  Advice to quit is delivered clearly specifically in reducing the risk of developing heart disease, having a stroke, or of developing all types of cancer, especially lung and oral cancer. Improvement in breathing and exercise tolerance and quality of life is also discussed, as is the economic benefit.  Assessment of willingness to quit or to make an attempt to quit is made and documented  Assistance in quit attempt is made with several and varied options presented,  based on patient's desire and need. These include  literature, local classes available, 1800 QUIT NOW number, OTC and prescription medication.  The GOAL to be NICOTINE FREE is re emphasized.  The patient has set a personal goal of either reduction or discontinuation and follow up is arranged between 6 an 16 weeks.    Morbid obesity with body mass index of 40.0-44.9 in adult unchanged Patient re-educated about  the importance of commitment to a  minimum of  150 minutes of exercise per week.  The importance of healthy food choices with portion control discussed. Encouraged to start a food diary, count calories and to consider  joining a support group. Sample diet sheets offered. Goals set by the patient for the next several months.   Weight /BMI 03/13/2017 01/08/2017 10/06/2016  WEIGHT 297 lb 8 oz 299 lb 1.9 oz 295 lb  HEIGHT 6\' 0"  6\' 0"  6\' 0"   BMI 40.35 kg/m2 40.57 kg/m2 40.01 kg/m2

## 2017-03-17 NOTE — Assessment & Plan Note (Signed)

## 2017-03-17 NOTE — Assessment & Plan Note (Signed)
unchanged Patient re-educated about  the importance of commitment to a  minimum of 150 minutes of exercise per week.  The importance of healthy food choices with portion control discussed. Encouraged to start a food diary, count calories and to consider  joining a support group. Sample diet sheets offered. Goals set by the patient for the next several months.   Weight /BMI 03/13/2017 01/08/2017 10/06/2016  WEIGHT 297 lb 8 oz 299 lb 1.9 oz 295 lb  HEIGHT 6\' 0"  6\' 0"  6\' 0"   BMI 40.35 kg/m2 40.57 kg/m2 40.01 kg/m2

## 2017-03-29 ENCOUNTER — Other Ambulatory Visit (INDEPENDENT_AMBULATORY_CARE_PROVIDER_SITE_OTHER): Payer: Self-pay | Admitting: Internal Medicine

## 2017-03-29 DIAGNOSIS — Z1210 Encounter for screening for malignant neoplasm of intestinal tract, unspecified: Secondary | ICD-10-CM

## 2017-06-12 ENCOUNTER — Telehealth (INDEPENDENT_AMBULATORY_CARE_PROVIDER_SITE_OTHER): Payer: Self-pay | Admitting: *Deleted

## 2017-06-12 ENCOUNTER — Encounter (INDEPENDENT_AMBULATORY_CARE_PROVIDER_SITE_OTHER): Payer: Self-pay | Admitting: *Deleted

## 2017-06-12 NOTE — Telephone Encounter (Signed)
Patient needs trilyte 

## 2017-06-13 MED ORDER — PEG 3350-KCL-NA BICARB-NACL 420 G PO SOLR: 4000.0000 mL | Freq: Once | ORAL | 0 refills | Status: AC

## 2017-07-06 ENCOUNTER — Telehealth (INDEPENDENT_AMBULATORY_CARE_PROVIDER_SITE_OTHER): Payer: Self-pay | Admitting: *Deleted

## 2017-07-06 NOTE — Telephone Encounter (Signed)
Referring MD/PCP: simpson   Procedure: tcs  Reason/Indication:  screening  Has patient had this procedure before?  no  If so, when, by whom and where?    Is there a family history of colon cancer?  no  Who?  What age when diagnosed?    Is patient diabetic?   yes      Does patient have prosthetic heart valve or mechanical valve?  no  Do you have a pacemaker?  no  Has patient ever had endocarditis? no  Has patient had joint replacement within last 12 months?  no  Is patient constipated or take laxatives? no  Does patient have a history of alcohol/drug use?  no  Is patient on Coumadin, Plavix and/or Aspirin? no  Medications: pravastatin 20 mg daily, metformin 1000 mg bid, losartan 25 mg daily, glipizide 5 mg daily, clonidine 2 mg nightly, amlodipine 10 mg daily  Allergies: nkda  Medication Adjustment per Dr Laural Golden: hold diabetic medicine evening before and morning of  Procedure date & time: 08/08/17 at 930

## 2017-07-10 NOTE — Telephone Encounter (Signed)
agree

## 2017-07-23 ENCOUNTER — Other Ambulatory Visit: Payer: Self-pay | Admitting: Family Medicine

## 2017-07-23 DIAGNOSIS — I10 Essential (primary) hypertension: Secondary | ICD-10-CM

## 2017-07-24 DIAGNOSIS — E1159 Type 2 diabetes mellitus with other circulatory complications: Secondary | ICD-10-CM | POA: Diagnosis not present

## 2017-07-25 ENCOUNTER — Other Ambulatory Visit: Payer: Self-pay

## 2017-07-25 ENCOUNTER — Other Ambulatory Visit (HOSPITAL_COMMUNITY)
Admission: AD | Admit: 2017-07-25 | Discharge: 2017-07-25 | Disposition: A | Discharge status: Home / Self Care | Payer: Medicare HMO | Source: Skilled Nursing Facility | Attending: Family Medicine | Admitting: Family Medicine

## 2017-07-25 ENCOUNTER — Encounter: Payer: Self-pay | Admitting: Family Medicine

## 2017-07-25 ENCOUNTER — Ambulatory Visit (INDEPENDENT_AMBULATORY_CARE_PROVIDER_SITE_OTHER): Payer: Medicare HMO | Admitting: Family Medicine

## 2017-07-25 VITALS — BP 138/86 | HR 122 | Resp 16 | Ht 72.0 in | Wt 295.0 lb

## 2017-07-25 DIAGNOSIS — E785 Hyperlipidemia, unspecified: Secondary | ICD-10-CM

## 2017-07-25 DIAGNOSIS — Z0000 Encounter for general adult medical examination without abnormal findings: Secondary | ICD-10-CM | POA: Diagnosis not present

## 2017-07-25 DIAGNOSIS — R000 Tachycardia, unspecified: Secondary | ICD-10-CM

## 2017-07-25 DIAGNOSIS — Z23 Encounter for immunization: Secondary | ICD-10-CM

## 2017-07-25 DIAGNOSIS — B351 Tinea unguium: Secondary | ICD-10-CM

## 2017-07-25 DIAGNOSIS — F17210 Nicotine dependence, cigarettes, uncomplicated: Secondary | ICD-10-CM | POA: Diagnosis not present

## 2017-07-25 DIAGNOSIS — Z6841 Body Mass Index (BMI) 40.0 and over, adult: Secondary | ICD-10-CM

## 2017-07-25 DIAGNOSIS — E1159 Type 2 diabetes mellitus with other circulatory complications: Secondary | ICD-10-CM | POA: Insufficient documentation

## 2017-07-25 DIAGNOSIS — F17200 Nicotine dependence, unspecified, uncomplicated: Secondary | ICD-10-CM

## 2017-07-25 DIAGNOSIS — G4733 Obstructive sleep apnea (adult) (pediatric): Secondary | ICD-10-CM

## 2017-07-25 LAB — COMPLETE METABOLIC PANEL WITH GFR
AG RATIO: 1.4 (calc) (ref 1.0–2.5)
ALKALINE PHOSPHATASE (APISO): 62 U/L (ref 40–115)
ALT: 13 U/L (ref 9–46)
AST: 11 U/L (ref 10–35)
Albumin: 4 g/dL (ref 3.6–5.1)
BILIRUBIN TOTAL: 0.7 mg/dL (ref 0.2–1.2)
BUN: 16 mg/dL (ref 7–25)
CALCIUM: 9.3 mg/dL (ref 8.6–10.3)
CHLORIDE: 101 mmol/L (ref 98–110)
CO2: 30 mmol/L (ref 20–32)
Creat: 0.93 mg/dL (ref 0.70–1.33)
GFR, Est African American: 107 mL/min/{1.73_m2} (ref >=60)
GFR, Est Non African American: 93 mL/min/{1.73_m2} (ref >=60)
GLOBULIN: 2.8 g/dL (ref 1.9–3.7)
Glucose, Bld: 101 mg/dL — ABNORMAL HIGH (ref 65–99)
POTASSIUM: 4.2 mmol/L (ref 3.5–5.3)
SODIUM: 139 mmol/L (ref 135–146)
Total Protein: 6.8 g/dL (ref 6.1–8.1)

## 2017-07-25 LAB — HEMOGLOBIN A1C
HEMOGLOBIN A1C: 6.8 %{Hb} — AB (ref <5.7)
MEAN PLASMA GLUCOSE: 148 (calc)
eAG (mmol/L): 8.2 (calc)

## 2017-07-25 MED ORDER — TERBINAFINE HCL 250 MG PO TABS: 250.0000 mg | ORAL_TABLET | Freq: Every day | ORAL | 1 refills | Status: DC

## 2017-07-25 NOTE — Patient Instructions (Addendum)
F/u mid March, call if you need me before  Still NEED CPAP machine !!!  Condolence about your recent loss  Please reduce fried and fatty foods   Please STOP smoking   Leave energy drinks  Foot exam today  Fasting lipid, cmp and EGFr and hBA1C  3 to 5 days before visit  All the best colonoscopy  Twelve weeks of medication sent for toenails  Microalb from office today     

## 2017-07-26 LAB — MICROALBUMIN / CREATININE URINE RATIO
CREATININE, UR: 98 mg/dL
Microalb Creat Ratio: 20 mg/g creat (ref 0.0–30.0)
Microalb, Ur: 19.6 ug/mL — ABNORMAL HIGH

## 2017-07-28 ENCOUNTER — Encounter: Payer: Self-pay | Admitting: Family Medicine

## 2017-07-28 NOTE — Progress Notes (Signed)
Dakota Mendez     MRN: [id number]      DOB: 02-18-1961   HPI: Patient is in for annual physical exam. Recent labs,e are reviewed. Immunization is reviewed , and  updated     PE; BP 138/86   Pulse (!) 122   Resp 16   Ht 6' (1.829 m)   Wt 295 lb (133.8 kg)   SpO2 96%   BMI 40.01 kg/m   Pleasant male, alert and oriented x 3, in no cardio-pulmonary distress. Afebrile. HEENT No facial trauma or asymetry. Sinuses non tender. EOMI, pupils equally reactive to light. External ears normal, tympanic membranes clear. Oropharynx moist, no exudate. Neck: supple, no adenopathy,JVD or thyromegaly.No bruits.  Chest: Clear to ascultation bilaterally.No crackles or wheezes. Non tender to palpation  Breast: No asymetry,no masses. No nipple discharge or inversion. No axillary or supraclavicular adenopathy  Cardiovascular system; Heart sounds normal,  S1 and  S2 ,no S3.  No murmur, or thrill. Apical beat not displaced Peripheral pulses normal.  Abdomen: Soft, non tender, no organomegaly or masses. No bruits. Bowel sounds normal. No guarding, tenderness or rebound.  Rectal:  Not done , has upcoming colonoscopy  Musculoskeletal exam: Full ROM of spine, hips , shoulders and knees. No deformity ,swelling or crepitus noted. No muscle wasting or atrophy.   Neurologic: Cranial nerves 2 to 12 intact. Power, tone ,sensation and reflexes normal throughout. No disturbance in gait. No tremor.  Skin: Intact, bilateral onychomycosis of toenails Pigmentation normal throughout  Psych; Normal mood and affect. Judgement and concentration normal   Assessment & Plan:  Annual physical exam Annual exam as documented. Counseling done  re healthy lifestyle involving commitment to 150 minutes exercise per week, heart healthy diet, and attaining healthy weight.The importance of adequate sleep also discussed. Regular seat belt use and home safety, is also discussed. Changes in health  habits are decided on by the patient with goals and time frames  set for achieving them. Immunization and cancer screening needs are specifically addressed at this visit.   Tachycardia Patient noted to be tachycardic repeatedly. Did state that he had energy drink the day of the visit OFFICE DJS:HFWYO tachycardia with no acute ischemia  TOBACCO ABUSE Patient is asked and  confirms current  Nicotine use.  Five to seven minutes of time is spent in counseling the patient of the need to quit smoking  Advice to quit is delivered clearly specifically in reducing the risk of developing heart disease, having a stroke, or of developing all types of cancer, especially lung and oral cancer. Improvement in breathing and exercise tolerance and quality of life is also discussed, as is the economic benefit.  Assessment of willingness to quit or to make an attempt to quit is made and documented  Assistance in quit attempt is made with several and varied options presented, based on patient's desire and need. These include  literature, local classes available, 1800 QUIT NOW number, OTC and prescription medication.  The GOAL to be NICOTINE FREE is re emphasized.  The patient has set a personal goal of either reduction or discontinuation and follow up is arranged between 6 an 16 weeks.    Obstructive sleep apnea Untreated, still needs to get machine, importance of same is stressed  Type 2 diabetes mellitus with vascular disease (HCC) Controlled, no change in medication [name] is reminded of the importance of commitment to daily physical activity for 30 minutes or more, as able and the need to limit carbohydrate  intake to 30 to 60 grams per meal to help with blood sugar control.   The need to take medication as prescribed, test blood sugar as directed, and to call between visits if there is a concern that blood sugar is uncontrolled is also discussed.   [name] is reminded of the importance of  daily foot exam, annual eye examination, and good blood sugar, blood pressure and cholesterol control.  Diabetic Labs Latest Ref Rng & Units 07/25/2017 07/24/2017 02/13/2017 10/05/2016 05/31/2016  HbA1c <5.7 % of total Hgb - 6.8(H) 6.6(H) 6.1(H) 6.6(H)  Microalbumin Not Estab. ug/mL 19.6(H) - - - -  Micro/Creat Ratio 0.0 - 30.0 mg/g creat 20.0 - - - -  Chol <200 mg/dL - - 164 168 175  HDL >40 mg/dL - - 37(L) 41 42  Calc LDL <100 mg/dL - - 64 94 100  Triglycerides <150 mg/dL - - 317(H) 165(H) 165(H)  Creatinine 0.70 - 1.33 mg/dL - 0.93 1.18 0.93 0.98   BP/Weight 07/25/2017 03/13/2017 01/08/2017 10/06/2016 06/01/2016 03/06/2016 1/91/4782  Systolic BP 956 213 086 578 469 629 528  Diastolic BP 86 82 90 88 90 82 92  Wt. (Lbs) 295 297.5 299.12 295 299 - 299  BMI 40.01 40.35 40.57 40.01 40.55 - 40.54   Foot/eye exam completion dates Latest Ref Rng & Units 07/25/2017 03/13/2017  Eye Exam No Retinopathy - No Retinopathy  Foot exam Order - - -  Foot Form Completion - Done -        Onychomycosis Bilateral onychomycosis of toenails, severe, terbinafine x 3 months prescribed  '

## 2017-07-28 NOTE — Assessment & Plan Note (Signed)
Bilateral onychomycosis of toenails, severe, terbinafine x 3 months prescribed

## 2017-07-28 NOTE — Assessment & Plan Note (Signed)
Controlled, no change in medication Dakota Mendez is reminded of the importance of commitment to daily physical activity for 30 minutes or more, as able and the need to limit carbohydrate intake to 30 to 60 grams per meal to help with blood sugar control.   The need to take medication as prescribed, test blood sugar as directed, and to call between visits if there is a concern that blood sugar is uncontrolled is also discussed.   Dakota Mendez is reminded of the importance of daily foot exam, annual eye examination, and good blood sugar, blood pressure and cholesterol control.  Diabetic Labs Latest Ref Rng & Units 07/25/2017 07/24/2017 02/13/2017 10/05/2016 05/31/2016  HbA1c <5.7 % of total Hgb - 6.8(H) 6.6(H) 6.1(H) 6.6(H)  Microalbumin Not Estab. ug/mL 19.6(H) - - - -  Micro/Creat Ratio 0.0 - 30.0 mg/g creat 20.0 - - - -  Chol <200 mg/dL - - 164 168 175  HDL >40 mg/dL - - 37(L) 41 42  Calc LDL <100 mg/dL - - 64 94 100  Triglycerides <150 mg/dL - - 317(H) 165(H) 165(H)  Creatinine 0.70 - 1.33 mg/dL - 0.93 1.18 0.93 0.98   BP/Weight 07/25/2017 03/13/2017 01/08/2017 10/06/2016 06/01/2016 03/06/2016 7/74/1423  Systolic BP 953 202 334 356 861 683 729  Diastolic BP 86 82 90 88 90 82 92  Wt. (Lbs) 295 297.5 299.12 295 299 - 299  BMI 40.01 40.35 40.57 40.01 40.55 - 40.54   Foot/eye exam completion dates Latest Ref Rng & Units 07/25/2017 03/13/2017  Eye Exam No Retinopathy - No Retinopathy  Foot exam Order - - -  Foot Form Completion - Done -

## 2017-07-28 NOTE — Assessment & Plan Note (Signed)
Untreated, still needs to get machine, importance of same is stressed

## 2017-07-28 NOTE — Assessment & Plan Note (Signed)

## 2017-07-28 NOTE — Assessment & Plan Note (Signed)
Patient noted to be tachycardic repeatedly. Did state that he had energy drink the day of the visit OFFICE CHE:NIDPO tachycardia with no acute ischemia

## 2017-07-28 NOTE — Assessment & Plan Note (Signed)
Hyperlipidemia:Low fat diet discussed and encouraged.   Lipid Panel  Lab Results  Component Value Date   CHOL 164 02/13/2017   HDL 37 (L) 02/13/2017   LDLCALC 64 02/13/2017   TRIG 317 (H) 02/13/2017   CHOLHDL 4.4 02/13/2017   Uncontrolled, needs to reduce fried and fatty foods

## 2017-07-28 NOTE — Assessment & Plan Note (Signed)

## 2017-08-08 ENCOUNTER — Encounter (HOSPITAL_COMMUNITY): Payer: Self-pay | Admitting: *Deleted

## 2017-08-08 ENCOUNTER — Encounter (HOSPITAL_COMMUNITY)
Admission: RE | Disposition: A | Discharge status: Home / Self Care | Payer: Self-pay | Source: Ambulatory Visit | Attending: Internal Medicine

## 2017-08-08 ENCOUNTER — Ambulatory Visit (HOSPITAL_COMMUNITY)
Admission: RE | Admit: 2017-08-08 | Discharge: 2017-08-08 | Disposition: A | Discharge status: Home / Self Care | Payer: Medicare HMO | Source: Ambulatory Visit | Attending: Internal Medicine | Admitting: Internal Medicine

## 2017-08-08 ENCOUNTER — Other Ambulatory Visit: Payer: Self-pay

## 2017-08-08 DIAGNOSIS — E119 Type 2 diabetes mellitus without complications: Secondary | ICD-10-CM | POA: Insufficient documentation

## 2017-08-08 DIAGNOSIS — E669 Obesity, unspecified: Secondary | ICD-10-CM | POA: Insufficient documentation

## 2017-08-08 DIAGNOSIS — G4730 Sleep apnea, unspecified: Secondary | ICD-10-CM | POA: Diagnosis not present

## 2017-08-08 DIAGNOSIS — K648 Other hemorrhoids: Secondary | ICD-10-CM | POA: Insufficient documentation

## 2017-08-08 DIAGNOSIS — G8929 Other chronic pain: Secondary | ICD-10-CM | POA: Diagnosis not present

## 2017-08-08 DIAGNOSIS — I10 Essential (primary) hypertension: Secondary | ICD-10-CM | POA: Insufficient documentation

## 2017-08-08 DIAGNOSIS — F17210 Nicotine dependence, cigarettes, uncomplicated: Secondary | ICD-10-CM | POA: Insufficient documentation

## 2017-08-08 DIAGNOSIS — Z1210 Encounter for screening for malignant neoplasm of intestinal tract, unspecified: Secondary | ICD-10-CM | POA: Insufficient documentation

## 2017-08-08 DIAGNOSIS — Z7984 Long term (current) use of oral hypoglycemic drugs: Secondary | ICD-10-CM | POA: Diagnosis not present

## 2017-08-08 DIAGNOSIS — D123 Benign neoplasm of transverse colon: Secondary | ICD-10-CM | POA: Diagnosis not present

## 2017-08-08 DIAGNOSIS — D125 Benign neoplasm of sigmoid colon: Secondary | ICD-10-CM | POA: Diagnosis not present

## 2017-08-08 DIAGNOSIS — K635 Polyp of colon: Secondary | ICD-10-CM | POA: Insufficient documentation

## 2017-08-08 DIAGNOSIS — K644 Residual hemorrhoidal skin tags: Secondary | ICD-10-CM | POA: Diagnosis not present

## 2017-08-08 DIAGNOSIS — Z1211 Encounter for screening for malignant neoplasm of colon: Secondary | ICD-10-CM | POA: Diagnosis not present

## 2017-08-08 DIAGNOSIS — E785 Hyperlipidemia, unspecified: Secondary | ICD-10-CM | POA: Insufficient documentation

## 2017-08-08 DIAGNOSIS — Z79899 Other long term (current) drug therapy: Secondary | ICD-10-CM | POA: Diagnosis not present

## 2017-08-08 HISTORY — DX: Sleep apnea, unspecified: G47.30

## 2017-08-08 HISTORY — PX: COLONOSCOPY: SHX5424

## 2017-08-08 LAB — GLUCOSE, CAPILLARY: Glucose-Capillary: 149 mg/dL — ABNORMAL HIGH (ref 65–99)

## 2017-08-08 SURGERY — COLONOSCOPY
Anesthesia: Moderate Sedation

## 2017-08-08 MED ORDER — STERILE WATER FOR IRRIGATION IR SOLN
Status: DC | PRN
  Administered 2017-08-08: 10:00:00

## 2017-08-08 MED ORDER — MEPERIDINE HCL 50 MG/ML IJ SOLN
INTRAMUSCULAR | Status: AC
  Filled 2017-08-08: qty 1

## 2017-08-08 MED ORDER — SODIUM CHLORIDE 0.9 % IV SOLN
INTRAVENOUS | Status: DC
  Administered 2017-08-08: 1000 mL via INTRAVENOUS

## 2017-08-08 MED ORDER — MEPERIDINE HCL 50 MG/ML IJ SOLN
INTRAMUSCULAR | Status: DC | PRN
  Administered 2017-08-08 (×2): 25 mg via INTRAVENOUS

## 2017-08-08 MED ORDER — MIDAZOLAM HCL 5 MG/5ML IJ SOLN
INTRAMUSCULAR | Status: AC
  Filled 2017-08-08: qty 10

## 2017-08-08 MED ORDER — MIDAZOLAM HCL 5 MG/5ML IJ SOLN
INTRAMUSCULAR | Status: DC | PRN
  Administered 2017-08-08 (×5): 2 mg via INTRAVENOUS

## 2017-08-08 NOTE — H&P (Signed)
Dakota Mendez is an 56 y.o. male.   Chief Complaint: Patient is here for colonoscopy. HPI: Patient is a 56 year old African-American male who is here for screening colonoscopy.  He denies abdominal pain change in bowel habits or rectal bleeding. This is patient's first exam. Family history is negative for CRC.  Past Medical History:  Diagnosis Date  . Chronic back pain   . Chronic neck pain   . Diabetes mellitus   . Hyperlipidemia   . Hypertension   . Obesity   . Sleep apnea    states he does not wear his CPAP  . Tobacco abuse     Past Surgical History:  Procedure Laterality Date  . CERVICAL SPINE SURGERY    . SPINE SURGERY  09/21/2011   cervial spine    Family History  Problem Relation Age of Onset  . Cirrhosis Father   . Heart attack Mother   . Depression Mother   . Stroke Mother   . Hearing loss Brother   . Cirrhosis Brother   . Diabetes Sister   . Sleep apnea Son   . Hypertension Son    Social History:  reports that he has been smoking cigarettes.  He has a 20.00 pack-year smoking history. he has never used smokeless tobacco. He reports that he does not drink alcohol or use drugs.  Allergies: No Known Allergies  Medications Prior to Admission  Medication Sig Dispense Refill  . amLODipine (NORVASC) 10 MG tablet TAKE 1 TABLET EVERY DAY 90 tablet 1  . cloNIDine (CATAPRES) 0.2 MG tablet TAKE 1 TABLET EVERY DAY (Patient taking differently: TAKE 1 TABLET EVERY DAY AT NIGHT) 90 tablet 1  . glipiZIDE (GLUCOTROL XL) 5 MG 24 hr tablet TAKE 1 TABLET EVERY DAY WITH BREAKFAST  (DECREASE  IN  DOSE) 90 tablet 1  . ibuprofen (ADVIL,MOTRIN) 200 MG tablet Take 200-400 mg daily as needed by mouth for mild pain.    Marland Kitchen losartan-hydrochlorothiazide (HYZAAR) 100-25 MG tablet TAKE 1 TABLET EVERY DAY 90 tablet 1  . metFORMIN (GLUCOPHAGE) 1000 MG tablet TAKE 1 TABLET TWICE DAILY WITH A MEAL 180 tablet 1  . pravastatin (PRAVACHOL) 20 MG tablet TAKE 1 TABLET EVERY DAY (Patient taking  differently: TAKE 1 TABLET EVERY DAY AT NIGHT) 90 tablet 1  . terbinafine (LAMISIL) 250 MG tablet Take 1 tablet (250 mg total) daily by mouth. 42 tablet 1  . glipiZIDE (GLUCOTROL XL) 10 MG 24 hr tablet Take 10 mg daily by mouth.    Marland Kitchen glucose blood (ONE TOUCH ULTRA TEST) test strip Use as instructed for once daily testing  Dx 250.00 length of need- 99 months 100 each 11  . ONETOUCH DELICA LANCETS 93G MISC 1 each by Does not apply route daily. For once daily testing DX 250.00 Length of need- 99 months 100 each 11    Results for orders placed or performed during the hospital encounter of 08/08/17 (from the past 48 hour(s))  Glucose, capillary     Status: Abnormal   Collection Time: 08/08/17  9:12 AM  Result Value Ref Range   Glucose-Capillary 149 (H) 65 - 99 mg/dL   No results found.  ROS  Blood pressure (!) 134/92, pulse (!) 104, temperature 98.4 F (36.9 C), temperature source Oral, resp. rate 18, height 6' (1.829 m), weight 295 lb (133.8 kg), SpO2 96 %. Physical Exam  Constitutional: He appears well-developed and well-nourished.  HENT:  Mouth/Throat: Oropharynx is clear and moist.  Eyes: Conjunctivae are normal. No scleral icterus.  Neck: No thyromegaly present.  Cardiovascular: Normal rate, regular rhythm and normal heart sounds.  No murmur heard. Respiratory: Effort normal and breath sounds normal.  GI:  Abdomen is full but soft and nontender without organomegaly or masses.  Musculoskeletal: He exhibits no edema.  Lymphadenopathy:    He has no cervical adenopathy.  Neurological: He is alert.  Skin: Skin is warm and dry.     Assessment/Plan Average risk screening colonoscopy.  Hildred Laser, MD 08/08/2017, 9:38 AM

## 2017-08-08 NOTE — Discharge Instructions (Signed)
No aspirin or NSAIDs for 1 week. °Resume usual medications and diet as before. °No driving for 24 hours. °Physician will call with biopsy results. ° ° ° ° ° °Colonoscopy, Adult, Care After °This sheet gives you information about how to care for yourself after your procedure. Your doctor may also give you more specific instructions. If you have problems or questions, call your doctor. °Follow these instructions at home: °General instructions ° °· For the first 24 hours after the procedure: °? Do not drive or use machinery. °? Do not sign important documents. °? Do not drink alcohol. °? Do your daily activities more slowly than normal. °? Eat foods that are soft and easy to digest. °? Rest often. °· Take over-the-counter or prescription medicines only as told by your doctor. °· It is up to you to get the results of your procedure. Ask your doctor, or the department performing the procedure, when your results will be ready. °To help cramping and bloating: °· Try walking around. °· Put heat on your belly (abdomen) as told by your doctor. Use a heat source that your doctor recommends, such as a moist heat pack or a heating pad. °? Put a towel between your skin and the heat source. °? Leave the heat on for 20-30 minutes. °? Remove the heat if your skin turns bright red. This is especially important if you cannot feel pain, heat, or cold. You can get burned. °Eating and drinking °· Drink enough fluid to keep your pee (urine) clear or pale yellow. °· Return to your normal diet as told by your doctor. Avoid heavy or fried foods that are hard to digest. °· Avoid drinking alcohol for as long as told by your doctor. °Contact a doctor if: °· You have blood in your poop (stool) 2-3 days after the procedure. °Get help right away if: °· You have more than a small amount of blood in your poop. °· You see large clumps of tissue (blood clots) in your poop. °· Your belly is swollen. °· You feel sick to your stomach (nauseous). °· You  throw up (vomit). °· You have a fever. °· You have belly pain that gets worse, and medicine does not help your pain. °This information is not intended to replace advice given to you by your health care provider. Make sure you discuss any questions you have with your health care provider. °Document Released: 09/30/2010 Document Revised: 05/22/2016 Document Reviewed: 05/22/2016 °Elsevier Interactive Patient Education © 2017 [employer] ° ° ° ° °Colon Polyps °Polyps are tissue growths inside the body. Polyps can grow in many places, including the large intestine (colon). A polyp may be a round bump or a mushroom-shaped growth. You could have one polyp or several. °Most colon polyps are noncancerous (benign). However, some colon polyps can become cancerous over time. °What are the causes? °The exact cause of colon polyps is not known. °What increases the risk? °This condition is more likely to develop in people who: °· Have a family history of colon cancer or colon polyps. °· Are older than 50 or older than 45 if they are African American. °· Have inflammatory bowel disease, such as ulcerative colitis or Crohn disease. °· Are overweight. °· Smoke cigarettes. °· Do not get enough exercise. °· Drink too much alcohol. °· Eat a diet that is: °? High in fat and red meat. °? Low in fiber. °· Had childhood cancer that was treated with abdominal radiation. ° °What are the signs or symptoms? °  polyps do not cause symptoms. If you have symptoms, they may include: °· Blood coming from your rectum when having a bowel movement. °· Blood in your stool. The stool may look dark red or black. °· A change in bowel habits, such as constipation or diarrhea. ° °How is this diagnosed? °This condition is diagnosed with a colonoscopy. This is a procedure that uses a lighted, flexible scope to look at the inside of your colon. °How is this treated? °Treatment for this condition involves removing any polyps that are found. Those polyps will  then be tested for cancer. If cancer is found, your health care provider will talk to you about options for colon cancer treatment. °Follow these instructions at home: °Diet °· Eat plenty of fiber, such as fruits, vegetables, and whole grains. °· Eat foods that are high in calcium and vitamin D, such as milk, cheese, yogurt, eggs, liver, fish, and broccoli. °· Limit foods high in fat, red meats, and processed meats, such as hot dogs, sausage, bacon, and lunch meats. °· Maintain a healthy weight, or lose weight if recommended by your health care provider. °General instructions °· Do not smoke cigarettes. °· Do not drink alcohol excessively. °· Keep all follow-up visits as told by your health care provider. This is important. This includes keeping regularly scheduled colonoscopies. Talk to your health care provider about when you need a colonoscopy. °· Exercise every day or as told by your health care provider. °Contact a health care provider if: °· You have new or worsening bleeding during a bowel movement. °· You have new or increased blood in your stool. °· You have a change in bowel habits. °· You unexpectedly lose weight. °This information is not intended to replace advice given to you by your health care provider. Make sure you discuss any questions you have with your health care provider. °Document Released: 05/24/2004 Document Revised: 02/03/2016 Document Reviewed: 07/19/2015 °Elsevier Interactive Patient Education © 2018 [employer] ° °

## 2017-08-08 NOTE — Op Note (Signed)
[facility] Patient Name: Angle Karel Procedure Date: 08/08/2017 9:30 AM MRN: [id number] Date of Birth: Apr 18, 1961 Attending MD: Hildred Laser , MD CSN: [id number] Age: 56 Admit Type: Outpatient Procedure:                Colonoscopy Indications:              Screening for colorectal malignant neoplasm Providers:                Hildred Laser, MD, Janeece Riggers, RN, Randa Spike,                            Technician Referring MD:             Norwood Levo. Moshe Cipro, MD Medicines:                Meperidine 50 mg IV, Midazolam 10 mg IV Complications:            No immediate complications. Estimated Blood Loss:     Estimated blood loss was minimal. Procedure:                Pre-Anesthesia Assessment:                           - Prior to the procedure, a History and Physical                            was performed, and patient medications and                            allergies were reviewed. The patient's tolerance of                            previous anesthesia was also reviewed. The risks                            and benefits of the procedure and the sedation                            options and risks were discussed with the patient.                            All questions were answered, and informed consent                            was obtained. Prior Anticoagulants: The patient has                            taken no previous anticoagulant or antiplatelet                            agents. ASA Grade Assessment: II - A patient with                            mild systemic disease. After reviewing the risks  and benefits, the patient was deemed in                            satisfactory condition to undergo the procedure.                           After obtaining informed consent, the colonoscope                            was passed under direct vision. Throughout the                            procedure, the patient's blood pressure, pulse, and                             oxygen saturations were monitored continuously. The                            [id number] ([id number]) scope was introduced through                            the anus and advanced to the the cecum, identified                            by appendiceal orifice and ileocecal valve. The                            colonoscopy was somewhat difficult due to a                            tortuous colon. The patient tolerated the procedure                            well. The quality of the bowel preparation was                            good. The ileocecal valve, appendiceal orifice, and                            rectum were photographed. Scope In: 9:51:16 AM Scope Out: 10:18:47 AM Scope Withdrawal Time: 0 hours 12 minutes 38 seconds  Total Procedure Duration: 0 hours 27 minutes 31 seconds  Findings:      The perianal and digital rectal examinations were normal.      A 6 mm polyp was found in the transverse colon. The polyp was sessile.       The polyp was removed with a hot snare. Resection and retrieval were       complete. The pathology specimen was placed into Bottle Number 1.      A small polyp was found in the sigmoid colon. The polyp was sessile.       Biopsies were taken with a cold forceps for histology. The pathology       specimen was placed into Bottle Number 1.      External and internal hemorrhoids were found during retroflexion.  The       hemorrhoids were small. Impression:               - One 6 mm polyp in the transverse colon, removed                            with a hot snare. Resected and retrieved.                           - One small polyp in the sigmoid colon. Biopsied.                           - External and internal hemorrhoids. Moderate Sedation:      Moderate (conscious) sedation was administered by the endoscopy nurse       and supervised by the endoscopist. The following parameters were       monitored: oxygen saturation, heart rate,  blood pressure, CO2       capnography and response to care. Total physician intraservice time was       36 minutes. Recommendation:           - Patient has a contact number available for                            emergencies. The signs and symptoms of potential                            delayed complications were discussed with the                            patient. Return to normal activities tomorrow.                            Written discharge instructions were provided to the                            patient.                           - Resume previous diet today.                           - Continue present medications.                           - No aspirin, ibuprofen, naproxen, or other                            non-steroidal anti-inflammatory drugs for 1 week                            after polyp removal.                           - Await pathology results.                           -  Repeat colonoscopy for surveillance based on                            pathology results. Procedure Code(s):        --- Professional ---                           [id number], Colonoscopy, flexible; with removal of                            tumor(s), polyp(s), or other lesion(s) by snare                            technique                           45380, 59, Colonoscopy, flexible; with biopsy,                            single or multiple                           99152, Moderate sedation services provided by the                            same physician or other qualified health care                            professional performing the diagnostic or                            therapeutic service that the sedation supports,                            requiring the presence of an independent trained                            observer to assist in the monitoring of the                            patient's level of consciousness and physiological                            status; initial 15  minutes of intraservice time,                            patient age 86 years or older                           [phone], Moderate sedation services; each additional                            15 minutes intraservice time Diagnosis Code(s):        --- Professional ---  Z12.11, Encounter for screening for malignant                            neoplasm of colon                           D12.3, Benign neoplasm of transverse colon (hepatic                            flexure or splenic flexure)                           D12.5, Benign neoplasm of sigmoid colon                           K64.8, Other hemorrhoids CPT copyright 2016 [employer]. All rights reserved. The codes documented in this report are preliminary and upon coder review may  be revised to meet current compliance requirements. Hildred Laser, MD Hildred Laser, MD 08/08/2017 10:27:42 AM This report has been signed electronically. Number of Addenda: 0

## 2017-08-10 ENCOUNTER — Encounter (HOSPITAL_COMMUNITY): Payer: Self-pay | Admitting: Internal Medicine

## 2017-11-26 DIAGNOSIS — E1159 Type 2 diabetes mellitus with other circulatory complications: Secondary | ICD-10-CM | POA: Diagnosis not present

## 2017-11-26 DIAGNOSIS — E785 Hyperlipidemia, unspecified: Secondary | ICD-10-CM | POA: Diagnosis not present

## 2017-11-27 ENCOUNTER — Encounter: Payer: Self-pay | Admitting: Family Medicine

## 2017-11-27 ENCOUNTER — Ambulatory Visit (INDEPENDENT_AMBULATORY_CARE_PROVIDER_SITE_OTHER): Payer: Medicare HMO | Admitting: Family Medicine

## 2017-11-27 VITALS — BP 140/90 | HR 113 | Resp 16 | Ht 72.0 in | Wt 294.1 lb

## 2017-11-27 DIAGNOSIS — I10 Essential (primary) hypertension: Secondary | ICD-10-CM | POA: Diagnosis not present

## 2017-11-27 DIAGNOSIS — F17210 Nicotine dependence, cigarettes, uncomplicated: Secondary | ICD-10-CM | POA: Diagnosis not present

## 2017-11-27 DIAGNOSIS — Z6841 Body Mass Index (BMI) 40.0 and over, adult: Secondary | ICD-10-CM | POA: Diagnosis not present

## 2017-11-27 DIAGNOSIS — F17200 Nicotine dependence, unspecified, uncomplicated: Secondary | ICD-10-CM

## 2017-11-27 DIAGNOSIS — E1159 Type 2 diabetes mellitus with other circulatory complications: Secondary | ICD-10-CM | POA: Diagnosis not present

## 2017-11-27 DIAGNOSIS — E785 Hyperlipidemia, unspecified: Secondary | ICD-10-CM

## 2017-11-27 LAB — HEMOGLOBIN A1C
EAG (MMOL/L): 8.9 (calc)
Hgb A1c MFr Bld: 7.2 % of total Hgb — ABNORMAL HIGH (ref <5.7)
Mean Plasma Glucose: 160 (calc)

## 2017-11-27 LAB — COMPLETE METABOLIC PANEL WITH GFR
AG RATIO: 1.3 (calc) (ref 1.0–2.5)
ALBUMIN MSPROF: 3.9 g/dL (ref 3.6–5.1)
ALT: 13 U/L (ref 9–46)
AST: 13 U/L (ref 10–35)
Alkaline phosphatase (APISO): 67 U/L (ref 40–115)
BUN: 20 mg/dL (ref 7–25)
CALCIUM: 9.4 mg/dL (ref 8.6–10.3)
CO2: 30 mmol/L (ref 20–32)
Chloride: 102 mmol/L (ref 98–110)
Creat: 0.98 mg/dL (ref 0.70–1.33)
GFR, EST AFRICAN AMERICAN: 101 mL/min/{1.73_m2} (ref >=60)
GFR, EST NON AFRICAN AMERICAN: 87 mL/min/{1.73_m2} (ref >=60)
Globulin: 3 g/dL (calc) (ref 1.9–3.7)
Glucose, Bld: 139 mg/dL — ABNORMAL HIGH (ref 65–99)
POTASSIUM: 4.2 mmol/L (ref 3.5–5.3)
Sodium: 140 mmol/L (ref 135–146)
TOTAL PROTEIN: 6.9 g/dL (ref 6.1–8.1)
Total Bilirubin: 0.8 mg/dL (ref 0.2–1.2)

## 2017-11-27 LAB — LIPID PANEL
CHOLESTEROL: 175 mg/dL (ref <200)
HDL: 40 mg/dL — AB (ref >40)
LDL Cholesterol (Calc): 109 mg/dL (calc) — ABNORMAL HIGH
NON-HDL CHOLESTEROL (CALC): 135 mg/dL — AB (ref <130)
Total CHOL/HDL Ratio: 4.4 (calc) (ref <5.0)
Triglycerides: 150 mg/dL — ABNORMAL HIGH (ref <150)

## 2017-11-27 MED ORDER — POTASSIUM CHLORIDE ER 10 MEQ PO TBCR: 10.0000 meq | EXTENDED_RELEASE_TABLET | Freq: Every day | ORAL | 3 refills | Status: DC

## 2017-11-27 MED ORDER — IBUPROFEN 800 MG PO TABS: ORAL_TABLET | ORAL | 0 refills | Status: DC

## 2017-11-27 MED ORDER — HYDROCHLOROTHIAZIDE 12.5 MG PO CAPS: 12.5000 mg | ORAL_CAPSULE | Freq: Every day | ORAL | 3 refills | Status: DC

## 2017-11-27 NOTE — Assessment & Plan Note (Signed)
Uncontrolled add HCTZ  DASH diet and commitment to daily physical activity for a minimum of 30 minutes discussed and encouraged, as a part of hypertension management. The importance of attaining a healthy weight is also discussed.  BP/Weight 11/27/2017 08/08/2017 07/25/2017 03/13/2017 01/08/2017 10/06/2016 2/48/2500  Systolic BP 370 488 891 694 503 888 280  Diastolic BP 90 89 86 82 90 88 90  Wt. (Lbs) 294.12 295 295 297.5 299.12 295 299  BMI 39.89 40.01 40.01 40.35 40.57 40.01 40.55

## 2017-11-27 NOTE — Assessment & Plan Note (Signed)
Asked: and confirms currently smoking cigarettes Assess: still not ready to set quit date wants to cut back Advised:need to quit due to personal increased risk of heart disease andbeing diabetic, stroke with hypertension and all types of cancer and lung failure Arrange f/u in 3 to 4 months, may call between visits. Aware of 1800 quit NOW and community resources.No medication desired to help with quitting    Time spent 5 mins

## 2017-11-27 NOTE — Assessment & Plan Note (Signed)
Hyperlipidemia:Low fat diet discussed and encouraged.   Lipid Panel  Lab Results  Component Value Date   CHOL 175 11/26/2017   HDL 40 (L) 11/26/2017   LDLCALC 109 (H) 11/26/2017   TRIG 150 (H) 11/26/2017   CHOLHDL 4.4 11/26/2017   Uncontrolled needs to reduce fat in diet and increase exercise  Updated lab needed at/ before next visit.

## 2017-11-27 NOTE — Progress Notes (Signed)
Dakota Mendez     MRN: [id number]      DOB: Jan 24, 1961   HPI Dakota Mendez is here for follow up and re-evaluation of chronic medical conditions, medication management and review of any available recent lab and radiology data.  Preventive health is updated, specifically  Cancer screening and Immunization.   Questions or concerns regarding consultations or procedures which the PT has had in the interim are  addressed. The PT denies any adverse reactions to current medications since the last visit.  There are no new concerns.  There are no specific complaints  Denies polyuria, polydipsia, blurred vision , or hypoglycemic episodes.   ROS Denies recent fever or chills. Denies sinus pressure, nasal congestion, ear pain or sore throat. Denies chest congestion, productive cough or wheezing. Denies chest pains, palpitations and leg swelling Denies abdominal pain, nausea, vomiting,diarrhea or constipation.   Denies dysuria, frequency, hesitancy or incontinence. Denies joint pain, swelling and limitation in mobility. Denies headaches, seizures, numbness, or tingling. Denies depression, anxiety or insomnia. Denies skin break down or rash.   PE  BP 140/90   Pulse (!) 113   Resp 16   Ht 6' (1.829 m)   Wt 294 lb 1.9 oz (133.4 kg)   SpO2 97%   BMI 39.89 kg/m   Patient alert and oriented and in no cardiopulmonary distress.  HEENT: No facial asymmetry, EOMI,   oropharynx pink and moist.  Neck supple no JVD, no mass.  Chest: Clear to auscultation bilaterally.  CVS: S1, S2 no murmurs, no S3.Regular rate.  ABD: Soft non tender.   Ext: No edema  MS: Adequate ROM spine, shoulders, hips and knees.  Skin: Intact, no ulcerations or rash noted.  Psych: Good eye contact, normal affect. Memory intact not anxious or depressed appearing.  CNS: CN 2-12 intact, power,  normal throughout.no focal deficits noted.   Assessment & Plan  Essential hypertension Uncontrolled add HCTZ  DASH  diet and commitment to daily physical activity for a minimum of 30 minutes discussed and encouraged, as a part of hypertension management. The importance of attaining a healthy weight is also discussed.  BP/Weight 11/27/2017 08/08/2017 07/25/2017 03/13/2017 01/08/2017 10/06/2016 0/94/7096  Systolic BP 283 662 947 654 650 354 656  Diastolic BP 90 89 86 82 90 88 90  Wt. (Lbs) 294.12 295 295 297.5 299.12 295 299  BMI 39.89 40.01 40.01 40.35 40.57 40.01 40.55       Type 2 diabetes mellitus with vascular disease (HCC) Adequate thjough nor as good control, no med change Dakota Mendez is reminded of the importance of commitment to daily physical activity for 30 minutes or more, as able and the need to limit carbohydrate intake to 30 to 60 grams per meal to help with blood sugar control.   The need to take medication as prescribed, test blood sugar as directed, and to call between visits if there is a concern that blood sugar is uncontrolled is also discussed.   Dakota Mendez is reminded of the importance of daily foot exam, annual eye examination, and good blood sugar, blood pressure and cholesterol control.  Diabetic Labs Latest Ref Rng & Units 11/26/2017 07/25/2017 07/24/2017 02/13/2017 10/05/2016  HbA1c <5.7 % of total Hgb 7.2(H) - 6.8(H) 6.6(H) 6.1(H)  Microalbumin Not Estab. ug/mL - 19.6(H) - - -  Micro/Creat Ratio 0.0 - 30.0 mg/g creat - 20.0 - - -  Chol <200 mg/dL 175 - - 164 168  HDL >40 mg/dL 40(L) - - 37(L) 41  Calc LDL mg/dL (calc) 109(H) - - 64 94  Triglycerides <150 mg/dL 150(H) - - 317(H) 165(H)  Creatinine 0.70 - 1.33 mg/dL 0.98 - 0.93 1.18 0.93   BP/Weight 11/27/2017 08/08/2017 07/25/2017 03/13/2017 01/08/2017 10/06/2016 12/02/5571  Systolic BP 220 254 270 623 762 831 517  Diastolic BP 90 89 86 82 90 88 90  Wt. (Lbs) 294.12 295 295 297.5 299.12 295 299  BMI 39.89 40.01 40.01 40.35 40.57 40.01 40.55   Foot/eye exam completion dates Latest Ref Rng & Units 07/25/2017 03/13/2017  Eye Exam No  Retinopathy - No Retinopathy  Foot exam Order - - -  Foot Form Completion - Done -        Hyperlipidemia LDL goal <100 Hyperlipidemia:Low fat diet discussed and encouraged.   Lipid Panel  Lab Results  Component Value Date   CHOL 175 11/26/2017   HDL 40 (L) 11/26/2017   LDLCALC 109 (H) 11/26/2017   TRIG 150 (H) 11/26/2017   CHOLHDL 4.4 11/26/2017   Uncontrolled needs to reduce fat in diet and increase exercise  Updated lab needed at/ before next visit.   Morbid obesity with body mass index of 40.0-44.9 in adult Unchanged. Patient re-educated about  the importance of commitment to a  minimum of 150 minutes of exercise per week.  The importance of healthy food choices with portion control discussed. Encouraged to start a food diary, count calories and to consider  joining a support group. Sample diet sheets offered. Goals set by the patient for the next several months.   Weight /BMI 11/27/2017 08/08/2017 07/25/2017  WEIGHT 294 lb 1.9 oz 295 lb 295 lb  HEIGHT 6\' 0"  6\' 0"  6\' 0"   BMI 39.89 kg/m2 40.01 kg/m2 40.01 kg/m2      TOBACCO ABUSE Asked: and confirms currently smoking cigarettes Assess: still not ready to set quit date wants to cut back Advised:need to quit due to personal increased risk of heart disease andbeing diabetic, stroke with hypertension and all types of cancer and lung failure Arrange f/u in 3 to 4 months, may call between visits. Aware of 1800 quit NOW and community resources.No medication desired to help with quitting    Time spent 5 mins

## 2017-11-27 NOTE — Assessment & Plan Note (Signed)
Adequate thjough nor as good control, no med change Dakota Mendez is reminded of the importance of commitment to daily physical activity for 30 minutes or more, as able and the need to limit carbohydrate intake to 30 to 60 grams per meal to help with blood sugar control.   The need to take medication as prescribed, test blood sugar as directed, and to call between visits if there is a concern that blood sugar is uncontrolled is also discussed.   Dakota Mendez is reminded of the importance of daily foot exam, annual eye examination, and good blood sugar, blood pressure and cholesterol control.  Diabetic Labs Latest Ref Rng & Units 11/26/2017 07/25/2017 07/24/2017 02/13/2017 10/05/2016  HbA1c <5.7 % of total Hgb 7.2(H) - 6.8(H) 6.6(H) 6.1(H)  Microalbumin Not Estab. ug/mL - 19.6(H) - - -  Micro/Creat Ratio 0.0 - 30.0 mg/g creat - 20.0 - - -  Chol <200 mg/dL 175 - - 164 168  HDL >40 mg/dL 40(L) - - 37(L) 41  Calc LDL mg/dL (calc) 109(H) - - 64 94  Triglycerides <150 mg/dL 150(H) - - 317(H) 165(H)  Creatinine 0.70 - 1.33 mg/dL 0.98 - 0.93 1.18 0.93   BP/Weight 11/27/2017 08/08/2017 07/25/2017 03/13/2017 01/08/2017 10/06/2016 3/95/3202  Systolic BP 334 356 861 683 729 021 115  Diastolic BP 90 89 86 82 90 88 90  Wt. (Lbs) 294.12 295 295 297.5 299.12 295 299  BMI 39.89 40.01 40.01 40.35 40.57 40.01 40.55   Foot/eye exam completion dates Latest Ref Rng & Units 07/25/2017 03/13/2017  Eye Exam No Retinopathy - No Retinopathy  Foot exam Order - - -  Foot Form Completion - Done -

## 2017-11-27 NOTE — Assessment & Plan Note (Signed)
Unchanged. Patient re-educated about  the importance of commitment to a  minimum of 150 minutes of exercise per week.  The importance of healthy food choices with portion control discussed. Encouraged to start a food diary, count calories and to consider  joining a support group. Sample diet sheets offered. Goals set by the patient for the next several months.   Weight /BMI 11/27/2017 08/08/2017 07/25/2017  WEIGHT 294 lb 1.9 oz 295 lb 295 lb  HEIGHT 6\' 0"  6\' 0"  6\' 0"   BMI 39.89 kg/m2 40.01 kg/m2 40.01 kg/m2

## 2017-11-27 NOTE — Patient Instructions (Addendum)
Wellness with nurse in April, mid to end preferred,   CBC, fasting lipid, cmp and EGFr, HBA1C, PSA   5 days before September appt    F/U with MD early Septemebr , call if you need me before  New additional tablet for BP is HCTZ 12.5 mg one daily with one potassium tablet ,  Both are sent in  Ibuprofen for use ONLY if needed in limited amt is sent in  It is important that you exercise regularly at least 45 minutes 5 times a week. If you develop chest pain, have severe difficulty breathing, or feel very tired, stop exercising immediately and seek medical attention    Please stop cookies and sugar should only be in fruit  Increase vegetables and water  CONGRATS on being up to date  Thank you  for choosing [facility]. We consider it a privelige to serve you.  Delivering excellent health care in a caring and  compassionate way is our goal.  Partnering with you,  so that together we can achieve this goal is our strategy.

## 2017-12-10 ENCOUNTER — Other Ambulatory Visit: Payer: Self-pay | Admitting: Family Medicine

## 2017-12-10 DIAGNOSIS — I10 Essential (primary) hypertension: Secondary | ICD-10-CM

## 2018-02-18 ENCOUNTER — Ambulatory Visit: Payer: Medicare HMO

## 2018-03-27 ENCOUNTER — Ambulatory Visit (INDEPENDENT_AMBULATORY_CARE_PROVIDER_SITE_OTHER): Payer: Medicare HMO

## 2018-03-27 VITALS — BP 134/84 | HR 90 | Temp 98.5°F | Ht 72.0 in | Wt 296.8 lb

## 2018-03-27 DIAGNOSIS — Z0000 Encounter for general adult medical examination without abnormal findings: Secondary | ICD-10-CM

## 2018-03-27 NOTE — Progress Notes (Signed)
Subjective:   Dakota Mendez is a 57 y.o. male who presents for Medicare Annual/Subsequent preventive examination.  Review of Systems: N/A Cardiac Risk Factors include: advanced age (>81men, >72 women);obesity (BMI >30kg/m2);diabetes mellitus;sedentary lifestyle;dyslipidemia;hypertension;male gender;smoking/ tobacco exposure     Objective:    Vitals: BP 134/84   Pulse 90   Temp 98.5 F (36.9 C) (Oral)   Ht 6' (1.829 m)   Wt 296 lb 12 oz (134.6 kg)   BMI 40.25 kg/m   Body mass index is 40.25 kg/m.  Advanced Directives 03/27/2018 08/08/2017 01/08/2017 08/24/2015 03/04/2015 02/17/2015  Does Patient Have a Medical Advance Directive? Yes No No No No No  Type of Paramedic of [place];Living will - - - - -  Copy of [place] in Chart? No - copy requested - - - - -  Would patient like information on creating a medical advance directive? - No - Patient declined Yes (MAU/Ambulatory/Procedural Areas - Information given) No - patient declined information No - patient declined information No - patient declined information    Tobacco Social History   Tobacco Use  Smoking Status Current Every Day Smoker  . Packs/day: 0.50  . Years: 40.00  . Pack years: 20.00  . Types: Cigarettes  Smokeless Tobacco Never Used  Tobacco Comment   1 PPD for 35 years     Ready to quit: Not Answered Counseling given: Not Answered Comment: 1 PPD for 35 years   Clinical Intake:  Pre-visit preparation completed: Yes  Pain : 0-10 Pain Score: 6  Pain Type: Chronic pain Pain Location: Back(BACK AND NECK ) Pain Orientation: Lower Pain Descriptors / Indicators: Aching Pain Onset: More than a month ago Pain Frequency: Intermittent     Nutritional Status: BMI > 30  Obese Nutritional Risks: None Diabetes: Yes CBG done?: No Did pt. bring in CBG monitor from home?: No  How often do you need to have someone help you when you read instructions, pamphlets, or  other written materials from your doctor or pharmacy?: 1 - Never What is the last grade level you completed in school?: 10th grade  Interpreter Needed?: No  Information entered by :: Andrez Grime, LPN  Past Medical History:  Diagnosis Date  . Chronic back pain   . Chronic neck pain   . Diabetes mellitus   . Hyperlipidemia   . Hypertension   . Obesity   . Sleep apnea    states he does not wear his CPAP  . Tobacco abuse    Past Surgical History:  Procedure Laterality Date  . CERVICAL SPINE SURGERY    . COLONOSCOPY N/A 08/08/2017   Procedure: COLONOSCOPY;  Surgeon: Rogene Houston, MD;  Location: [facility];  Service: Endoscopy;  Laterality: N/A;  930  . SPINE SURGERY  09/21/2011   cervial spine   Family History  Problem Relation Age of Onset  . Cirrhosis Father   . Heart attack Mother   . Depression Mother   . Stroke Mother   . Hearing loss Brother   . Cirrhosis Brother   . Diabetes Sister   . Sleep apnea Son   . Hypertension Son    Social History   Socioeconomic History  . Marital status: Married    Spouse name: Not on file  . Number of children: 4  . Years of education: Not on file  . Highest education level: 10th grade  Occupational History  . Occupation: disabled-truck dirver  Social Needs  .  Financial resource strain: Not hard at all  . Food insecurity:    Worry: Never true    Inability: Never true  . Transportation needs:    Medical: No    Non-medical: No  Tobacco Use  . Smoking status: Current Every Day Smoker    Packs/day: 0.50    Years: 40.00    Pack years: 20.00    Types: Cigarettes  . Smokeless tobacco: Never Used  . Tobacco comment: 1 PPD for 35 years  Substance and Sexual Activity  . Alcohol use: No  . Drug use: No  . Sexual activity: Yes  Lifestyle  . Physical activity:    Days per week: 0 days    Minutes per session: 0 min  . Stress: Not at all  Relationships  . Social connections:    Talks on phone: More than three times  a week    Gets together: More than three times a week    Attends religious service: More than 4 times per year    Active member of club or organization: No    Attends meetings of clubs or organizations: Never    Relationship status: Married  Other Topics Concern  . Not on file  Social History Narrative  . Not on file    Outpatient Encounter Medications as of 03/27/2018  Medication Sig  . amLODipine (NORVASC) 10 MG tablet TAKE 1 TABLET EVERY DAY  . cloNIDine (CATAPRES) 0.2 MG tablet TAKE 1 TABLET EVERY DAY  . glipiZIDE (GLUCOTROL XL) 10 MG 24 hr tablet Take 10 mg daily by mouth.  Marland Kitchen glipiZIDE (GLUCOTROL XL) 5 MG 24 hr tablet TAKE 1 TABLET EVERY DAY WITH BREAKFAST  (DECREASE  IN  DOSE)  . glucose blood (ONE TOUCH ULTRA TEST) test strip Use as instructed for once daily testing  Dx 250.00 length of need- 99 months  . hydrochlorothiazide (MICROZIDE) 12.5 MG capsule Take 1 capsule (12.5 mg total) by mouth daily.  Marland Kitchen ibuprofen (ADVIL,MOTRIN) 800 MG tablet TAKE 1 TABLET TWICE DAILY AS NEEDED FOR PAIN  . losartan-hydrochlorothiazide (HYZAAR) 100-25 MG tablet TAKE 1 TABLET EVERY DAY  . metFORMIN (GLUCOPHAGE) 1000 MG tablet TAKE 1 TABLET TWICE DAILY WITH A MEAL  . ONETOUCH DELICA LANCETS 16X MISC 1 each by Does not apply route daily. For once daily testing DX 250.00 Length of need- 99 months  . potassium chloride (KLOR-CON 10) 10 MEQ tablet Take 1 tablet (10 mEq total) by mouth daily.  . pravastatin (PRAVACHOL) 20 MG tablet TAKE 1 TABLET EVERY DAY  . terbinafine (LAMISIL) 250 MG tablet Take 1 tablet (250 mg total) daily by mouth.   No facility-administered encounter medications on file as of 03/27/2018.     Activities of Daily Living In your present state of health, do you have any difficulty performing the following activities: 03/27/2018  Hearing? N  Vision? N  Difficulty concentrating or making decisions? N  Walking or climbing stairs? Y  Comment Patient has some balance issues   Dressing  or bathing? N  Doing errands, shopping? N  Preparing Food and eating ? N  Using the Toilet? N  In the past six months, have you accidently leaked urine? N  Do you have problems with loss of bowel control? N  Managing your Medications? N  Managing your Finances? N  Housekeeping or managing your Housekeeping? N  Some recent data might be hidden    Patient Care Team: Fayrene Helper, MD as PCP - General   Assessment:  This is a routine wellness examination for Felder.  Exercise Activities and Dietary recommendations Current Exercise Habits: The patient does not participate in regular exercise at present, Exercise limited by: None identified  Goals    . Exercise 3x per week (30 min per time)     Recommend starting a routine exercise program at least 3 days a week for 30-45 minutes at a time as tolerated.         Fall Risk Fall Risk  03/27/2018 01/08/2017 11/29/2015 08/24/2015 02/17/2015  Falls in the past year? No Yes Yes No No  Number falls in past yr: - 2 or more 2 or more - -  Injury with Fall? - No No - -  Risk for fall due to : - Impaired balance/gait - Impaired mobility;Impaired balance/gait Impaired mobility;Impaired balance/gait  Follow up - Education provided;Falls prevention discussed - - -   Is the patient's home free of loose throw rugs in walkways, pet beds, electrical cords, etc?   yes      Grab bars in the bathroom? yes      Handrails on the stairs?   yes      Adequate lighting?   yes  Timed Get Up and Go Performed: passed, completed within 30 seconds  Depression Screen PHQ 2/9 Scores 03/27/2018 03/13/2017 01/08/2017 10/06/2016  PHQ - 2 Score 2 0 0 0  PHQ- 9 Score 3 - - -    Cognitive Function     6CIT Screen 03/27/2018 01/08/2017  What Year? 0 points 0 points  What month? 0 points 0 points  What time? 0 points 0 points  Count back from 20 0 points 0 points  Months in reverse 0 points 0 points  Repeat phrase 0 points 0 points  Total Score 0 0     Immunization History  Administered Date(s) Administered  . Influenza Whole 10/11/2007, 05/17/2011  . Influenza,inj,Quad PF,6+ Mos 06/13/2013, 06/18/2014, 07/22/2015, 06/01/2016, 07/25/2017  . Pneumococcal Conjugate-13 12/15/2014  . Pneumococcal Polysaccharide-23 04/05/2011, 06/01/2016  . Td 09/21/2006    Qualifies for Shingles Vaccine? Advised patient to check with pharmacy about receiving this vaccine   Screening Tests Health Maintenance  Topic Date Due  . OPHTHALMOLOGY EXAM  03/13/2018  . TETANUS/TDAP  11/28/2018 (Originally 09/21/2016)  . INFLUENZA VACCINE  04/11/2018  . HEMOGLOBIN A1C  05/29/2018  . FOOT EXAM  07/28/2018  . COLONOSCOPY  08/09/2027  . PNEUMOCOCCAL POLYSACCHARIDE VACCINE  Completed  . Hepatitis C Screening  Completed  . HIV Screening  Completed   Cancer Screenings: Lung: Low Dose CT Chest recommended if Age 67-80 years, 30 pack-year currently smoking OR have quit w/in 15years. Patient does qualify.  Colorectal: completed 08/08/2017  Additional Screenings:  Hepatitis C Screening: completed 11/27/15     Patient declined Tetanus vaccine.  Plan:    I have personally reviewed and noted the following in the patient's chart:   . Medical and social history . Use of alcohol, tobacco or illicit drugs  . Current medications and supplements . Functional ability and status . Nutritional status . Physical activity . Advanced directives . List of other physicians . Hospitalizations, surgeries, and ER visits in previous 12 months . Vitals . Screenings to include cognitive, depression, and falls . Referrals and appointments  In addition, I have reviewed and discussed with patient certain preventive protocols, quality metrics, and best practice recommendations. A written personalized care plan for preventive services as well as general preventive health recommendations were provided to patient.  Andrez Grime, LPN  5/83/1674

## 2018-03-27 NOTE — Patient Instructions (Signed)
[name] , Thank you for taking time to come for your Medicare Wellness Visit. I appreciate your ongoing commitment to your health goals. Please review the following plan we discussed and let me know if I can assist you in the future.   Screening recommendations/referrals: Colonoscopy: up to date, next due 08/09/2027 Recommended yearly ophthalmology/optometry visit for glaucoma screening and checkup Recommended yearly dental visit for hygiene and checkup  Vaccinations: Influenza vaccine: due 04/2018  Pneumococcal vaccine: up to date Tdap vaccine: declined due to insurance  Shingles vaccine: Check with your pharmacy about receiving the Shingrix vaccine      Advanced directives: Please bring a copy of your POA (Power of Attorney) and/or Living Will to your next appointment.   Conditions/risks identified: Try to start exercising at least 3 times a week for about 30-45 minutes.   Next appointment: schedule next AWV in 1 year   Preventive Care 40-64 Years, Male Preventive care refers to lifestyle choices and visits with your health care provider that can promote health and wellness. What does preventive care include?  A yearly physical exam. This is also called an annual well check.  Dental exams once or twice a year.  Routine eye exams. Ask your health care provider how often you should have your eyes checked.  Personal lifestyle choices, including:  Daily care of your teeth and gums.  Regular physical activity.  Eating a healthy diet.  Avoiding tobacco and drug use.  Limiting alcohol use.  Practicing safe sex.  Taking low-dose aspirin every day starting at age 12. What happens during an annual well check? The services and screenings done by your health care provider during your annual well check will depend on your age, overall health, lifestyle risk factors, and family history of disease. Counseling  Your health care provider may ask you questions about  your:  Alcohol use.  Tobacco use.  Drug use.  Emotional well-being.  Home and relationship well-being.  Sexual activity.  Eating habits.  Work and work Statistician. Screening  You may have the following tests or measurements:  Height, weight, and BMI.  Blood pressure.  Lipid and cholesterol levels. These may be checked every 5 years, or more frequently if you are over 10 years old.  Skin check.  Lung cancer screening. You may have this screening every year starting at age 52 if you have a 30-pack-year history of smoking and currently smoke or have quit within the past 15 years.  Fecal occult blood test (FOBT) of the stool. You may have this test every year starting at age 9.  Flexible sigmoidoscopy or colonoscopy. You may have a sigmoidoscopy every 5 years or a colonoscopy every 10 years starting at age 36.  Prostate cancer screening. Recommendations will vary depending on your family history and other risks.  Hepatitis C blood test.  Hepatitis B blood test.  Sexually transmitted disease (STD) testing.  Diabetes screening. This is done by checking your blood sugar (glucose) after you have not eaten for a while (fasting). You may have this done every 1-3 years. Discuss your test results, treatment options, and if necessary, the need for more tests with your health care provider. Vaccines  Your health care provider may recommend certain vaccines, such as:  Influenza vaccine. This is recommended every year.  Tetanus, diphtheria, and acellular pertussis (Tdap, Td) vaccine. You may need a Td booster every 10 years.  Zoster vaccine. You may need this after age 63.  Pneumococcal 13-valent conjugate (PCV13) vaccine. You  may need this if you have certain conditions and have not been vaccinated.  Pneumococcal polysaccharide (PPSV23) vaccine. You may need one or two doses if you smoke cigarettes or if you have certain conditions. Talk to your health care provider about  which screenings and vaccines you need and how often you need them. This information is not intended to replace advice given to you by your health care provider. Make sure you discuss any questions you have with your health care provider. Document Released: 09/24/2015 Document Revised: 05/17/2016 Document Reviewed: 06/29/2015 Elsevier Interactive Patient Education  2017 [place] Prevention in the Home Falls can cause injuries. They can happen to people of all ages. There are many things you can do to make your home safe and to help prevent falls. What can I do on the outside of my home?  Regularly fix the edges of walkways and driveways and fix any cracks.  Remove anything that might make you trip as you walk through a door, such as a raised step or threshold.  Trim any bushes or trees on the path to your home.  Use bright outdoor lighting.  Clear any walking paths of anything that might make someone trip, such as rocks or tools.  Regularly check to see if handrails are loose or broken. Make sure that both sides of any steps have handrails.  Any raised decks and porches should have guardrails on the edges.  Have any leaves, snow, or ice cleared regularly.  Use sand or salt on walking paths during winter.  Clean up any spills in your garage right away. This includes oil or grease spills. What can I do in the bathroom?  Use night lights.  Install grab bars by the toilet and in the tub and shower. Do not use towel bars as grab bars.  Use non-skid mats or decals in the tub or shower.  If you need to sit down in the shower, use a plastic, non-slip stool.  Keep the floor dry. Clean up any water that spills on the floor as soon as it happens.  Remove soap buildup in the tub or shower regularly.  Attach bath mats securely with double-sided non-slip rug tape.  Do not have throw rugs and other things on the floor that can make you trip. What can I do in the  bedroom?  Use night lights.  Make sure that you have a light by your bed that is easy to reach.  Do not use any sheets or blankets that are too big for your bed. They should not hang down onto the floor.  Have a firm chair that has side arms. You can use this for support while you get dressed.  Do not have throw rugs and other things on the floor that can make you trip. What can I do in the kitchen?  Clean up any spills right away.  Avoid walking on wet floors.  Keep items that you use a lot in easy-to-reach places.  If you need to reach something above you, use a strong step stool that has a grab bar.  Keep electrical cords out of the way.  Do not use floor polish or wax that makes floors slippery. If you must use wax, use non-skid floor wax.  Do not have throw rugs and other things on the floor that can make you trip. What can I do with my stairs?  Do not leave any items on the stairs.  Make sure that there  are handrails on both sides of the stairs and use them. Fix handrails that are broken or loose. Make sure that handrails are as long as the stairways.  Check any carpeting to make sure that it is firmly attached to the stairs. Fix any carpet that is loose or worn.  Avoid having throw rugs at the top or bottom of the stairs. If you do have throw rugs, attach them to the floor with carpet tape.  Make sure that you have a light switch at the top of the stairs and the bottom of the stairs. If you do not have them, ask someone to add them for you. What else can I do to help prevent falls?  Wear shoes that:  Do not have high heels.  Have rubber bottoms.  Are comfortable and fit you well.  Are closed at the toe. Do not wear sandals.  If you use a stepladder:  Make sure that it is fully opened. Do not climb a closed stepladder.  Make sure that both sides of the stepladder are locked into place.  Ask someone to hold it for you, if possible.  Clearly mark and make  sure that you can see:  Any grab bars or handrails.  First and last steps.  Where the edge of each step is.  Use tools that help you move around (mobility aids) if they are needed. These include:  Canes.  Walkers.  Scooters.  Crutches.  Turn on the lights when you go into a dark area. Replace any light bulbs as soon as they burn out.  Set up your furniture so you have a clear path. Avoid moving your furniture around.  If any of your floors are uneven, fix them.  If there are any pets around you, be aware of where they are.  Review your medicines with your doctor. Some medicines can make you feel dizzy. This can increase your chance of falling. Ask your doctor what other things that you can do to help prevent falls. This information is not intended to replace advice given to you by your health care provider. Make sure you discuss any questions you have with your health care provider. Document Released: 06/24/2009 Document Revised: 02/03/2016 Document Reviewed: 10/02/2014 Elsevier Interactive Patient Education  2017 [place] [employer].

## 2018-05-30 ENCOUNTER — Ambulatory Visit (INDEPENDENT_AMBULATORY_CARE_PROVIDER_SITE_OTHER): Payer: Medicare HMO | Admitting: Family Medicine

## 2018-05-30 ENCOUNTER — Encounter: Payer: Self-pay | Admitting: Family Medicine

## 2018-05-30 VITALS — BP 128/84 | HR 88 | Resp 15 | Ht 72.0 in | Wt 296.0 lb

## 2018-05-30 DIAGNOSIS — Z125 Encounter for screening for malignant neoplasm of prostate: Secondary | ICD-10-CM

## 2018-05-30 DIAGNOSIS — F17200 Nicotine dependence, unspecified, uncomplicated: Secondary | ICD-10-CM | POA: Diagnosis not present

## 2018-05-30 DIAGNOSIS — Z23 Encounter for immunization: Secondary | ICD-10-CM

## 2018-05-30 DIAGNOSIS — E559 Vitamin D deficiency, unspecified: Secondary | ICD-10-CM

## 2018-05-30 DIAGNOSIS — Z6841 Body Mass Index (BMI) 40.0 and over, adult: Secondary | ICD-10-CM

## 2018-05-30 DIAGNOSIS — F17218 Nicotine dependence, cigarettes, with other nicotine-induced disorders: Secondary | ICD-10-CM | POA: Diagnosis not present

## 2018-05-30 DIAGNOSIS — E785 Hyperlipidemia, unspecified: Secondary | ICD-10-CM

## 2018-05-30 DIAGNOSIS — I10 Essential (primary) hypertension: Secondary | ICD-10-CM | POA: Diagnosis not present

## 2018-05-30 DIAGNOSIS — E1159 Type 2 diabetes mellitus with other circulatory complications: Secondary | ICD-10-CM

## 2018-05-30 MED ORDER — BUPROPION HCL ER (SR) 100 MG PO TB12: 100.0000 mg | ORAL_TABLET | Freq: Two times a day (BID) | ORAL | 1 refills | Status: DC

## 2018-05-30 NOTE — Patient Instructions (Addendum)
Physical exam with MD end Nov  Or early December, call if  You need me before  Flu vaccine today  Fasting lipid, cmp and EGFr, CBC, PSA , TSH and vit D tomorrow ( [facility])   Call  [address] for help with stopping smoking , plan to quit 2 weeks after you start medication sent today  QUIT DATE is Oct 7  You will be contacte re lung scan for lung cancer screening  It is important that you exercise regularly at least 30 minutes 5 times a week. If you develop chest pain, have severe difficulty breathing, or feel very tired, stop exercising immediately and seek medical attention  Thank you  for choosing  [facility]. We consider it a privelige to serve you.  Delivering excellent health care in a caring and  compassionate way is our goal.  Partnering with you,  so that together we can achieve this goal is our strategy.

## 2018-05-31 DIAGNOSIS — E559 Vitamin D deficiency, unspecified: Secondary | ICD-10-CM | POA: Diagnosis not present

## 2018-05-31 DIAGNOSIS — E1159 Type 2 diabetes mellitus with other circulatory complications: Secondary | ICD-10-CM | POA: Diagnosis not present

## 2018-05-31 DIAGNOSIS — Z125 Encounter for screening for malignant neoplasm of prostate: Secondary | ICD-10-CM | POA: Diagnosis not present

## 2018-05-31 DIAGNOSIS — E785 Hyperlipidemia, unspecified: Secondary | ICD-10-CM | POA: Diagnosis not present

## 2018-06-01 ENCOUNTER — Encounter: Payer: Self-pay | Admitting: Family Medicine

## 2018-06-01 NOTE — Assessment & Plan Note (Signed)
Controlled, no change in medication DASH diet and commitment to daily physical activity for a minimum of 30 minutes discussed and encouraged, as a part of hypertension management. The importance of attaining a healthy weight is also discussed.  BP/Weight 05/30/2018 03/27/2018 11/27/2017 08/08/2017 07/25/2017 03/13/2017 9/58/4417  Systolic BP 127 871 836 725 500 164 290  Diastolic BP 84 84 90 89 86 82 90  Wt. (Lbs) 296 296.75 294.12 295 295 297.5 299.12  BMI 40.14 40.25 39.89 40.01 40.01 40.35 40.57

## 2018-06-01 NOTE — Assessment & Plan Note (Signed)
Hyperlipidemia:Low fat diet discussed and encouraged.   Lipid Panel  Lab Results  Component Value Date   CHOL 179 05/31/2018   HDL 38 (L) 05/31/2018   LDLCALC 100 (H) 05/31/2018   TRIG 288 (H) 05/31/2018   CHOLHDL 4.7 05/31/2018   Needs to reduce fatty foods TG elevated

## 2018-06-01 NOTE — Assessment & Plan Note (Signed)
Deteriorated. Patient re-educated about  the importance of commitment to a  minimum of 150 minutes of exercise per week.  The importance of healthy food choices with portion control discussed. Encouraged to start a food diary, count calories and to consider  joining a support group. Sample diet sheets offered. Goals set by the patient for the next several months.   Weight /BMI 05/30/2018 03/27/2018 11/27/2017  WEIGHT 296 lb 296 lb 12 oz 294 lb 1.9 oz  HEIGHT 6\' 0"  6\' 0"  6\' 0"   BMI 40.14 kg/m2 40.25 kg/m2 39.89 kg/m2

## 2018-06-01 NOTE — Assessment & Plan Note (Signed)
After obtaining informed consent, the vaccine is  administered by LPN.  

## 2018-06-01 NOTE — Assessment & Plan Note (Signed)
Asked : confirms smokes 1 PPD Assess: wants  To quit Advise: needs to reduce risk of stroke, heart attack and all types of cancer, and save $$ Assist: counseled fr 11 mins, and medication is prescribed, also lung screening scan ordered  Arrange f/u in 4 months

## 2018-06-01 NOTE — Assessment & Plan Note (Signed)
Updated lab needed at/ before next visit. Dakota Mendez is reminded of the importance of commitment to daily physical activity for 30 minutes or more, as able and the need to limit carbohydrate intake to 30 to 60 grams per meal to help with blood sugar control.   The need to take medication as prescribed, test blood sugar as directed, and to call between visits if there is a concern that blood sugar is uncontrolled is also discussed.   Dakota Mendez is reminded of the importance of daily foot exam, annual eye examination, and good blood sugar, blood pressure and cholesterol control.  Diabetic Labs Latest Ref Rng & Units 05/31/2018 11/26/2017 07/25/2017 07/24/2017 02/13/2017  HbA1c <5.7 % of total Hgb - 7.2(H) - 6.8(H) 6.6(H)  Microalbumin Not Estab. ug/mL - - 19.6(H) - -  Micro/Creat Ratio 0.0 - 30.0 mg/g creat - - 20.0 - -  Chol <200 mg/dL 179 175 - - 164  HDL >40 mg/dL 38(L) 40(L) - - 37(L)  Calc LDL mg/dL (calc) 100(H) 109(H) - - 64  Triglycerides <150 mg/dL 288(H) 150(H) - - 317(H)  Creatinine 0.70 - 1.33 mg/dL 1.13 0.98 - 0.93 1.18   BP/Weight 05/30/2018 03/27/2018 11/27/2017 08/08/2017 07/25/2017 03/13/2017 3/90/3009  Systolic BP 233 007 622 633 354 562 563  Diastolic BP 84 84 90 89 86 82 90  Wt. (Lbs) 296 296.75 294.12 295 295 297.5 299.12  BMI 40.14 40.25 39.89 40.01 40.01 40.35 40.57   Foot/eye exam completion dates Latest Ref Rng & Units 07/25/2017 03/13/2017  Eye Exam No Retinopathy - No Retinopathy  Foot exam Order - - -  Foot Form Completion - Done -

## 2018-06-01 NOTE — Progress Notes (Signed)
Dakota Mendez     MRN: [id number]      DOB: 29-Mar-1961   HPI Dakota Mendez is here for follow up and re-evaluation of chronic medical conditions, medication management and review of any available recent lab and radiology data.  Preventive health is updated, specifically  Cancer screening and Immunization.   Questions or concerns regarding consultations or procedures which the PT has had in the interim are  addressed. The PT denies any adverse reactions to current medications since the last visit.  There are no new concerns.  There are no specific complaints   ROS Denies recent fever or chills. Denies sinus pressure, nasal congestion, ear pain or sore throat. Denies chest congestion, productive cough or wheezing. Denies chest pains, palpitations and leg swelling Denies abdominal pain, nausea, vomiting,diarrhea or constipation.   Denies dysuria, frequency, hesitancy or incontinence. Denies joint pain, swelling and limitation in mobility. Denies headaches, seizures, numbness, or tingling. Denies depression, anxiety or insomnia. Denies skin break down or rash.   PE  BP 128/84   Pulse 88   Resp 15   Ht 6' (1.829 m)   Wt 296 lb (134.3 kg)   SpO2 97%   BMI 40.14 kg/m   Patient alert and oriented and in no cardiopulmonary distress.  HEENT: No facial asymmetry, EOMI,   oropharynx pink and moist.  Neck supple no JVD, no mass.  Chest: Clear to auscultation bilaterally.  CVS: S1, S2 no murmurs, no S3.Regular rate.  ABD: Soft non tender.   Ext: No edema  MS: Adequate ROM spine, shoulders, hips and knees.  Skin: Intact, no ulcerations or rash noted.  Psych: Good eye contact, normal affect. Memory intact not anxious or depressed appearing.  CNS: CN 2-12 intact, power,  normal throughout.no focal deficits noted.   Assessment & Plan  Type 2 diabetes mellitus with vascular disease ([place]) Updated lab needed at/ before next visit. Dakota Mendez is reminded of the importance  of commitment to daily physical activity for 30 minutes or more, as able and the need to limit carbohydrate intake to 30 to 60 grams per meal to help with blood sugar control.   The need to take medication as prescribed, test blood sugar as directed, and to call between visits if there is a concern that blood sugar is uncontrolled is also discussed.   Dakota Mendez is reminded of the importance of daily foot exam, annual eye examination, and good blood sugar, blood pressure and cholesterol control.  Diabetic Labs Latest Ref Rng & Units 05/31/2018 11/26/2017 07/25/2017 07/24/2017 02/13/2017  HbA1c <5.7 % of total Hgb - 7.2(H) - 6.8(H) 6.6(H)  Microalbumin Not Estab. ug/mL - - 19.6(H) - -  Micro/Creat Ratio 0.0 - 30.0 mg/g creat - - 20.0 - -  Chol <200 mg/dL 179 175 - - 164  HDL >40 mg/dL 38(L) 40(L) - - 37(L)  Calc LDL mg/dL (calc) 100(H) 109(H) - - 64  Triglycerides <150 mg/dL 288(H) 150(H) - - 317(H)  Creatinine 0.70 - 1.33 mg/dL 1.13 0.98 - 0.93 1.18   BP/Weight 05/30/2018 03/27/2018 11/27/2017 08/08/2017 07/25/2017 03/13/2017 11/30/252  Systolic BP 270 623 762 831 517 616 073  Diastolic BP 84 84 90 89 86 82 90  Wt. (Lbs) 296 296.75 294.12 295 295 297.5 299.12  BMI 40.14 40.25 39.89 40.01 40.01 40.35 40.57   Foot/eye exam completion dates Latest Ref Rng & Units 07/25/2017 03/13/2017  Eye Exam No Retinopathy - No Retinopathy  Foot exam Order - - -  Foot Form Completion - Done -        Hyperlipidemia LDL goal <100 Hyperlipidemia:Low fat diet discussed and encouraged.   Lipid Panel  Lab Results  Component Value Date   CHOL 179 05/31/2018   HDL 38 (L) 05/31/2018   LDLCALC 100 (H) 05/31/2018   TRIG 288 (H) 05/31/2018   CHOLHDL 4.7 05/31/2018   Needs to reduce fatty foods TG elevated    Essential hypertension Controlled, no change in medication DASH diet and commitment to daily physical activity for a minimum of 30 minutes discussed and encouraged, as a part of hypertension  management. The importance of attaining a healthy weight is also discussed.  BP/Weight 05/30/2018 03/27/2018 11/27/2017 08/08/2017 07/25/2017 03/13/2017 05/31/1006  Systolic BP 121 975 883 254 982 641 583  Diastolic BP 84 84 90 89 86 82 90  Wt. (Lbs) 296 296.75 294.12 295 295 297.5 299.12  BMI 40.14 40.25 39.89 40.01 40.01 40.35 40.57       Morbid obesity with body mass index of 40.0-44.9 in adult Deteriorated. Patient re-educated about  the importance of commitment to a  minimum of 150 minutes of exercise per week.  The importance of healthy food choices with portion control discussed. Encouraged to start a food diary, count calories and to consider  joining a support group. Sample diet sheets offered. Goals set by the patient for the next several months.   Weight /BMI 05/30/2018 03/27/2018 11/27/2017  WEIGHT 296 lb 296 lb 12 oz 294 lb 1.9 oz  HEIGHT 6\' 0"  6\' 0"  6\' 0"   BMI 40.14 kg/m2 40.25 kg/m2 39.89 kg/m2      TOBACCO ABUSE Asked : confirms smokes 1 PPD Assess: wants  To quit Advise: needs to reduce risk of stroke, heart attack and all types of cancer, and save $$ Assist: counseled fr 11 mins, and medication is prescribed, also lung screening scan ordered  Arrange f/u in 4 months  Need for immunization against influenza After obtaining informed consent, the vaccine is  administered by LPN.

## 2018-06-03 ENCOUNTER — Telehealth: Payer: Self-pay | Admitting: Family Medicine

## 2018-06-03 NOTE — Telephone Encounter (Signed)
Pt is returning your call regarding Blood work

## 2018-06-04 LAB — COMPLETE METABOLIC PANEL WITH GFR
AG RATIO: 1.4 (calc) (ref 1.0–2.5)
ALT: 21 U/L (ref 9–46)
AST: 14 U/L (ref 10–35)
Albumin: 4.1 g/dL (ref 3.6–5.1)
Alkaline phosphatase (APISO): 70 U/L (ref 40–115)
BUN: 20 mg/dL (ref 7–25)
CALCIUM: 9.9 mg/dL (ref 8.6–10.3)
CO2: 31 mmol/L (ref 20–32)
CREATININE: 1.13 mg/dL (ref 0.70–1.33)
Chloride: 99 mmol/L (ref 98–110)
GFR, EST AFRICAN AMERICAN: 84 mL/min/{1.73_m2} (ref >=60)
GFR, EST NON AFRICAN AMERICAN: 73 mL/min/{1.73_m2} (ref >=60)
GLOBULIN: 3 g/dL (ref 1.9–3.7)
Glucose, Bld: 147 mg/dL — ABNORMAL HIGH (ref 65–99)
POTASSIUM: 3.8 mmol/L (ref 3.5–5.3)
Sodium: 139 mmol/L (ref 135–146)
TOTAL PROTEIN: 7.1 g/dL (ref 6.1–8.1)
Total Bilirubin: 0.8 mg/dL (ref 0.2–1.2)

## 2018-06-04 LAB — HEMOGLOBIN A1C W/OUT EAG: HEMOGLOBIN A1C: 7.4 %{Hb} — AB (ref <5.7)

## 2018-06-04 LAB — LIPID PANEL
CHOLESTEROL: 179 mg/dL (ref <200)
HDL: 38 mg/dL — AB (ref >40)
LDL Cholesterol (Calc): 100 mg/dL (calc) — ABNORMAL HIGH
Non-HDL Cholesterol (Calc): 141 mg/dL (calc) — ABNORMAL HIGH (ref <130)
Total CHOL/HDL Ratio: 4.7 (calc) (ref <5.0)
Triglycerides: 288 mg/dL — ABNORMAL HIGH (ref <150)

## 2018-06-04 LAB — CBC
HEMATOCRIT: 43.9 % (ref 38.5–50.0)
Hemoglobin: 14.6 g/dL (ref 13.2–17.1)
MCH: 28 pg (ref 27.0–33.0)
MCHC: 33.3 g/dL (ref 32.0–36.0)
MCV: 84.3 fL (ref 80.0–100.0)
MPV: 11.6 fL (ref 7.5–12.5)
PLATELETS: 379 10*3/uL (ref 140–400)
RBC: 5.21 10*6/uL (ref 4.20–5.80)
RDW: 13.6 % (ref 11.0–15.0)
WBC: 11.5 10*3/uL — ABNORMAL HIGH (ref 3.8–10.8)

## 2018-06-04 LAB — VITAMIN D 25 HYDROXY (VIT D DEFICIENCY, FRACTURES): VIT D 25 HYDROXY: 26 ng/mL — AB (ref 30–100)

## 2018-06-04 LAB — PSA: PSA: 0.8 ng/mL (ref <=4.0)

## 2018-06-04 LAB — TSH: TSH: 2.49 mIU/L (ref 0.40–4.50)

## 2018-06-04 NOTE — Telephone Encounter (Signed)
Advised patient of results with verbal understanding. He understands we will call him back with the A1C results when they are available.

## 2018-06-09 ENCOUNTER — Encounter: Payer: Self-pay | Admitting: Family Medicine

## 2018-06-09 ENCOUNTER — Other Ambulatory Visit: Payer: Self-pay | Admitting: Family Medicine

## 2018-06-09 MED ORDER — PRAVASTATIN SODIUM 40 MG PO TABS: 40.0000 mg | ORAL_TABLET | Freq: Every day | ORAL | 3 refills | Status: DC

## 2018-06-19 ENCOUNTER — Ambulatory Visit (HOSPITAL_COMMUNITY)
Admission: RE | Admit: 2018-06-19 | Discharge: 2018-06-19 | Disposition: A | Discharge status: Home / Self Care | Payer: Medicare HMO | Source: Ambulatory Visit | Attending: Family Medicine | Admitting: Family Medicine

## 2018-06-19 DIAGNOSIS — F17218 Nicotine dependence, cigarettes, with other nicotine-induced disorders: Secondary | ICD-10-CM | POA: Diagnosis not present

## 2018-06-19 DIAGNOSIS — Z87891 Personal history of nicotine dependence: Secondary | ICD-10-CM | POA: Diagnosis not present

## 2018-06-20 ENCOUNTER — Other Ambulatory Visit: Payer: Self-pay | Admitting: Family Medicine

## 2018-06-20 DIAGNOSIS — I10 Essential (primary) hypertension: Secondary | ICD-10-CM

## 2018-06-21 ENCOUNTER — Encounter: Payer: Self-pay | Admitting: Family Medicine

## 2018-08-06 ENCOUNTER — Ambulatory Visit (INDEPENDENT_AMBULATORY_CARE_PROVIDER_SITE_OTHER): Payer: Medicare HMO | Admitting: Family Medicine

## 2018-08-06 ENCOUNTER — Encounter: Payer: Self-pay | Admitting: Family Medicine

## 2018-08-06 VITALS — BP 120/84 | HR 98 | Resp 16 | Ht 72.0 in | Wt 295.0 lb

## 2018-08-06 DIAGNOSIS — N521 Erectile dysfunction due to diseases classified elsewhere: Secondary | ICD-10-CM

## 2018-08-06 DIAGNOSIS — I10 Essential (primary) hypertension: Secondary | ICD-10-CM

## 2018-08-06 DIAGNOSIS — F17200 Nicotine dependence, unspecified, uncomplicated: Secondary | ICD-10-CM

## 2018-08-06 DIAGNOSIS — Z0000 Encounter for general adult medical examination without abnormal findings: Secondary | ICD-10-CM

## 2018-08-06 DIAGNOSIS — E785 Hyperlipidemia, unspecified: Secondary | ICD-10-CM

## 2018-08-06 DIAGNOSIS — N528 Other male erectile dysfunction: Secondary | ICD-10-CM

## 2018-08-06 DIAGNOSIS — Z6841 Body Mass Index (BMI) 40.0 and over, adult: Secondary | ICD-10-CM

## 2018-08-06 DIAGNOSIS — E1159 Type 2 diabetes mellitus with other circulatory complications: Secondary | ICD-10-CM

## 2018-08-06 NOTE — Patient Instructions (Signed)
F/U in 3  months, call if you need me before Fasting lipid, cmp and EGFr, hBA1c  1 week before next visit  Foot exam is good except thick nails, let me know if you want a referral  I am referring you to Specialist about erections ( [place])  Restart the QUITTING effort because you NEED to!   Work on food choice, portion control, daily exercise and weight loss  Thank you  for choosing [facility]. We consider it a privelige to serve you.  Delivering excellent health care in a caring and  compassionate way is our goal.  Partnering with you,  so that together we can achieve this goal is our strategy.

## 2018-08-07 NOTE — Assessment & Plan Note (Signed)
Controlled, no change in medication Dakota Mendez is reminded of the importance of commitment to daily physical activity for 30 minutes or more, as able and the need to limit carbohydrate intake to 30 to 60 grams per meal to help with blood sugar control.   The need to take medication as prescribed, test blood sugar as directed, and to call between visits if there is a concern that blood sugar is uncontrolled is also discussed.   Dakota Mendez is reminded of the importance of daily foot exam, annual eye examination, and good blood sugar, blood pressure and cholesterol control.  Diabetic Labs Latest Ref Rng & Units 05/31/2018 11/26/2017 07/25/2017 07/24/2017 02/13/2017  HbA1c <5.7 % of total Hgb 7.4(H) 7.2(H) - 6.8(H) 6.6(H)  Microalbumin Not Estab. ug/mL - - 19.6(H) - -  Micro/Creat Ratio 0.0 - 30.0 mg/g creat - - 20.0 - -  Chol <200 mg/dL 179 175 - - 164  HDL >40 mg/dL 38(L) 40(L) - - 37(L)  Calc LDL mg/dL (calc) 100(H) 109(H) - - 64  Triglycerides <150 mg/dL 288(H) 150(H) - - 317(H)  Creatinine 0.70 - 1.33 mg/dL 1.13 0.98 - 0.93 1.18   BP/Weight 08/06/2018 05/30/2018 03/27/2018 11/27/2017 08/08/2017 44/96/7591 02/11/8465  Systolic BP 599 357 017 793 903 009 233  Diastolic BP 84 84 84 90 89 86 82  Wt. (Lbs) 295 296 296.75 294.12 295 295 297.5  BMI 40.01 40.14 40.25 39.89 40.01 40.01 40.35   Foot/eye exam completion dates Latest Ref Rng & Units 08/06/2018 07/25/2017  Eye Exam No Retinopathy - -  Foot exam Order - - -  Foot Form Completion - Done Done

## 2018-08-07 NOTE — Assessment & Plan Note (Addendum)

## 2018-08-07 NOTE — Assessment & Plan Note (Signed)
Difficulty in attaining and maintaining erection despite desire and cost of medication available is prohibitive, refer to Urology to explore options

## 2018-08-07 NOTE — Assessment & Plan Note (Signed)
Asked:confirms currently smokes cigarettes Assess: Unwilling to quit but cutting back Advise: needs to QUIT to reduce risk of cancer, cardio and cerebrovascular disease Assist: counseled for 5 minutes and literature provided Arrange: follow up in 3 months  

## 2018-08-07 NOTE — Progress Notes (Signed)
Dakota Mendez     MRN: [id number]      DOB: 03/05/1961   HPI: Patient is in for annual physical exam. C/o worsening ED wants help Recent labs, if available are reviewed. I  PE; BP 120/84   Pulse 98   Resp 16   Ht 6' (1.829 m)   Wt 295 lb (133.8 kg)   SpO2 96%   BMI 40.01 kg/m    Pleasant male, alert and oriented x 3, in no cardio-pulmonary distress. Afebrile. HEENT No facial trauma or asymetry. Sinuses non tender. EOMI, pupils equally reactive to light. External ears normal, tympanic membranes clear. Oropharynx moist, no exudate. Neck: supple, no adenopathy,JVD or thyromegaly.No bruits.  Chest: Clear to ascultation bilaterally.No crackles or wheezes. Non tender to palpation    Cardiovascular system; Heart sounds normal,  S1 and  S2 ,no S3.  No murmur, or thrill. Apical beat not displaced Peripheral pulses normal.  Abdomen: Soft, non tender, no organomegaly or masses. No bruits. Bowel sounds normal. No guarding, tenderness or rebound.      Musculoskeletal exam: Full ROM of spine, hips , shoulders and knees. No deformity ,swelling or crepitus noted. No muscle wasting or atrophy.   Neurologic: Cranial nerves 2 to 12 intact. Power, tone ,sensation and reflexes normal throughout. No disturbance in gait. No tremor.  Skin: Intact, no ulceration, erythema , scaling or rash noted. Pigmentation normal throughout  Psych; Normal mood and affect. Judgement and concentration normal   Assessment & Plan:  Physical exam, annual  Annual exam as documented. Counseling done  re healthy lifestyle involving commitment to 150 minutes exercise per week, heart healthy diet, and attaining healthy weight.The importance of adequate sleep also discussed. Regular seat belt use and home safety, is also discussed. Changes in health habits are decided on by the patient with goals and time frames  set for achieving them. Immunization and cancer screening needs are  specifically addressed at this visit.   TOBACCO ABUSE Asked:confirms currently smokes cigarettes Assess: Unwilling to quit but cutting back Advise: needs to QUIT to reduce risk of cancer, cardio and cerebrovascular disease Assist: counseled for 5 minutes and literature provided Arrange: follow up in 3 months   Morbid obesity with body mass index of 40.0-44.9 in adult Unchanged Patient re-educated about  the importance of commitment to a  minimum of 150 minutes of exercise per week.  The importance of healthy food choices with portion control discussed. Encouraged to start a food diary, count calories and to consider  joining a support group. Sample diet sheets offered. Goals set by the patient for the next several months.   Weight /BMI 08/06/2018 05/30/2018 03/27/2018  WEIGHT 295 lb 296 lb 296 lb 12 oz  HEIGHT 6\' 0"  6\' 0"  6\' 0"   BMI 40.01 kg/m2 40.14 kg/m2 40.25 kg/m2      Type 2 diabetes mellitus with vascular disease ([facility]) Controlled, no change in medication Dakota Mendez is reminded of the importance of commitment to daily physical activity for 30 minutes or more, as able and the need to limit carbohydrate intake to 30 to 60 grams per meal to help with blood sugar control.   The need to take medication as prescribed, test blood sugar as directed, and to call between visits if there is a concern that blood sugar is uncontrolled is also discussed.   Dakota Mendez is reminded of the importance of daily foot exam, annual eye examination, and good blood sugar, blood pressure and cholesterol control.  Diabetic  Labs Latest Ref Rng & Units 05/31/2018 11/26/2017 07/25/2017 07/24/2017 02/13/2017  HbA1c <5.7 % of total Hgb 7.4(H) 7.2(H) - 6.8(H) 6.6(H)  Microalbumin Not Estab. ug/mL - - 19.6(H) - -  Micro/Creat Ratio 0.0 - 30.0 mg/g creat - - 20.0 - -  Chol <200 mg/dL 179 175 - - 164  HDL >40 mg/dL 38(L) 40(L) - - 37(L)  Calc LDL mg/dL (calc) 100(H) 109(H) - - 64  Triglycerides <150 mg/dL  288(H) 150(H) - - 317(H)  Creatinine 0.70 - 1.33 mg/dL 1.13 0.98 - 0.93 1.18   BP/Weight 08/06/2018 05/30/2018 03/27/2018 11/27/2017 08/08/2017 02/40/9735 11/11/9922  Systolic BP 268 341 962 229 798 921 194  Diastolic BP 84 84 84 90 89 86 82  Wt. (Lbs) 295 296 296.75 294.12 295 295 297.5  BMI 40.01 40.14 40.25 39.89 40.01 40.01 40.35   Foot/eye exam completion dates Latest Ref Rng & Units 08/06/2018 07/25/2017  Eye Exam No Retinopathy - -  Foot exam Order - - -  Foot Form Completion - Done Done        Erectile dysfunction due to diseases classified elsewhere Difficulty in attaining and maintaining erection despite desire and cost of medication available is prohibitive, refer to Urology to explore options

## 2018-08-07 NOTE — Assessment & Plan Note (Signed)
Unchanged Patient re-educated about  the importance of commitment to a  minimum of 150 minutes of exercise per week.  The importance of healthy food choices with portion control discussed. Encouraged to start a food diary, count calories and to consider  joining a support group. Sample diet sheets offered. Goals set by the patient for the next several months.   Weight /BMI 08/06/2018 05/30/2018 03/27/2018  WEIGHT 295 lb 296 lb 296 lb 12 oz  HEIGHT 6\' 0"  6\' 0"  6\' 0"   BMI 40.01 kg/m2 40.14 kg/m2 40.25 kg/m2

## 2018-09-10 ENCOUNTER — Other Ambulatory Visit: Payer: Self-pay | Admitting: Family Medicine

## 2018-11-05 DIAGNOSIS — E1159 Type 2 diabetes mellitus with other circulatory complications: Secondary | ICD-10-CM | POA: Diagnosis not present

## 2018-11-05 DIAGNOSIS — E785 Hyperlipidemia, unspecified: Secondary | ICD-10-CM | POA: Diagnosis not present

## 2018-11-05 DIAGNOSIS — I10 Essential (primary) hypertension: Secondary | ICD-10-CM | POA: Diagnosis not present

## 2018-11-06 ENCOUNTER — Encounter: Payer: Self-pay | Admitting: Family Medicine

## 2018-11-06 ENCOUNTER — Ambulatory Visit (INDEPENDENT_AMBULATORY_CARE_PROVIDER_SITE_OTHER): Payer: Medicare HMO | Admitting: Family Medicine

## 2018-11-06 VITALS — BP 130/82 | HR 88 | Resp 14 | Ht 72.0 in | Wt 297.0 lb

## 2018-11-06 DIAGNOSIS — Z0000 Encounter for general adult medical examination without abnormal findings: Secondary | ICD-10-CM

## 2018-11-06 DIAGNOSIS — E1159 Type 2 diabetes mellitus with other circulatory complications: Secondary | ICD-10-CM | POA: Diagnosis not present

## 2018-11-06 DIAGNOSIS — F17210 Nicotine dependence, cigarettes, uncomplicated: Secondary | ICD-10-CM | POA: Diagnosis not present

## 2018-11-06 DIAGNOSIS — Z6841 Body Mass Index (BMI) 40.0 and over, adult: Secondary | ICD-10-CM | POA: Diagnosis not present

## 2018-11-06 DIAGNOSIS — F17200 Nicotine dependence, unspecified, uncomplicated: Secondary | ICD-10-CM

## 2018-11-06 DIAGNOSIS — E785 Hyperlipidemia, unspecified: Secondary | ICD-10-CM

## 2018-11-06 LAB — HEMOGLOBIN A1C
HEMOGLOBIN A1C: 7.3 %{Hb} — AB (ref <5.7)
Mean Plasma Glucose: 163 (calc)
eAG (mmol/L): 9 (calc)

## 2018-11-06 LAB — COMPLETE METABOLIC PANEL WITH GFR
AG Ratio: 1.6 (calc) (ref 1.0–2.5)
ALKALINE PHOSPHATASE (APISO): 66 U/L (ref 35–144)
ALT: 17 U/L (ref 9–46)
AST: 13 U/L (ref 10–35)
Albumin: 4.1 g/dL (ref 3.6–5.1)
BUN: 19 mg/dL (ref 7–25)
CALCIUM: 9.2 mg/dL (ref 8.6–10.3)
CO2: 27 mmol/L (ref 20–32)
CREATININE: 1.22 mg/dL (ref 0.70–1.33)
Chloride: 100 mmol/L (ref 98–110)
GFR, EST NON AFRICAN AMERICAN: 66 mL/min/{1.73_m2} (ref >=60)
GFR, Est African American: 77 mL/min/{1.73_m2} (ref >=60)
Globulin: 2.5 g/dL (calc) (ref 1.9–3.7)
Glucose, Bld: 147 mg/dL — ABNORMAL HIGH (ref 65–99)
POTASSIUM: 4.2 mmol/L (ref 3.5–5.3)
Sodium: 139 mmol/L (ref 135–146)
Total Bilirubin: 0.8 mg/dL (ref 0.2–1.2)
Total Protein: 6.6 g/dL (ref 6.1–8.1)

## 2018-11-06 LAB — LIPID PANEL
CHOL/HDL RATIO: 4.4 (calc) (ref <5.0)
Cholesterol: 191 mg/dL (ref <200)
HDL: 43 mg/dL (ref >=40)
LDL Cholesterol (Calc): 115 mg/dL (calc) — ABNORMAL HIGH
NON-HDL CHOLESTEROL (CALC): 148 mg/dL — AB (ref <130)
Triglycerides: 209 mg/dL — ABNORMAL HIGH (ref <150)

## 2018-11-06 NOTE — Patient Instructions (Addendum)
Wellness with nurse end August , flu vaccine at visit, and lab review, call if you need me before  Anuual physical exam with MD first week in december  Need to reduce fried and fatty foods, and also sweets, sugar and potatoes, bad cholesterol is higher than it should be   Commit to daily exercise Start 12 cigarettes / day and cut down by 1 each week   HBA1C, fasting cmp and EGFr, lipid middle August (quest)  Weight loss goal of 2.5 pounds per month  Think about what you will eat, plan ahead. Choose " clean, green, fresh or frozen" over canned, processed or packaged foods which are more sugary, salty and fatty. 70 to 75% of food eaten should be vegetables and fruit. Three meals at set times with snacks allowed between meals, but they must be fruit or vegetables. Aim to eat over a 12 hour period , example 7 am to 7 pm, and STOP after  your last meal of the day. Drink water,generally about 64 ounces per day, no other drink is as healthy. Fruit juice is best enjoyed in a healthy way, by EATING the fruit.  Thank you  for choosing  Primary Care. We consider it a privelige to serve you.  Delivering excellent health care in a caring and  compassionate way is our goal.  Partnering with you,  so that together we can achieve this goal is our strategy.     Steps to Quit Smoking  Smoking tobacco can be bad for your health. It can also affect almost every organ in your body. Smoking puts you and people around you at risk for many serious long-lasting (chronic) diseases. Quitting smoking is hard, but it is one of the best things that you can do for your health. It is never too late to quit. What are the benefits of quitting smoking? When you quit smoking, you lower your risk for getting serious diseases and conditions. They can include:  Lung cancer or lung disease.  Heart disease.  Stroke.  Heart attack.  Not being able to have children (infertility).  Weak bones  (osteoporosis) and broken bones (fractures). If you have coughing, wheezing, and shortness of breath, those symptoms may get better when you quit. You may also get sick less often. If you are pregnant, quitting smoking can help to lower your chances of having a baby of low birth weight. What can I do to help me quit smoking? Talk with your doctor about what can help you quit smoking. Some things you can do (strategies) include:  Quitting smoking totally, instead of slowly cutting back how much you smoke over a period of time.  Going to in-person counseling. You are more likely to quit if you go to many counseling sessions.  Using resources and support systems, such as: ? Database administrator with a Social worker. ? Phone quitlines. ? Careers information officer. ? Support groups or group counseling. ? Text messaging programs. ? Mobile phone apps or applications.  Taking medicines. Some of these medicines may have nicotine in them. If you are pregnant or breastfeeding, do not take any medicines to quit smoking unless your doctor says it is okay. Talk with your doctor about counseling or other things that can help you. Talk with your doctor about using more than one strategy at the same time, such as taking medicines while you are also going to in-person counseling. This can help make quitting easier. What things can I do to make it easier to  quit? Quitting smoking might feel very hard at first, but there is a lot that you can do to make it easier. Take these steps:  Talk to your family and friends. Ask them to support and encourage you.  Call phone quitlines, reach out to support groups, or work with a Social worker.  Ask people who smoke to not smoke around you.  Avoid places that make you want (trigger) to smoke, such as: ? Bars. ? Parties. ? Smoke-break areas at work.  Spend time with people who do not smoke.  Lower the stress in your life. Stress can make you want to smoke. Try these things to  help your stress: ? Getting regular exercise. ? Deep-breathing exercises. ? Yoga. ? Meditating. ? Doing a body scan. To do this, close your eyes, focus on one area of your body at a time from head to toe, and notice which parts of your body are tense. Try to relax the muscles in those areas.  Download or buy apps on your mobile phone or tablet that can help you stick to your quit plan. There are many free apps, such as QuitGuide from the [employer] Office manager for Disease Control and Prevention). You can find more support from smokefree.gov and other websites. This information is not intended to replace advice given to you by your health care provider. Make sure you discuss any questions you have with your health care provider. Document Released: 06/24/2009 Document Revised: 04/25/2016 Document Reviewed: 01/12/2015 Elsevier Interactive Patient Education  2019 [employer].

## 2018-11-07 ENCOUNTER — Encounter: Payer: Self-pay | Admitting: Family Medicine

## 2018-11-07 NOTE — Progress Notes (Signed)
   Dakota Mendez     MRN: [id number]      DOB: 10-05-1960   HPI: Patient is in for annual physical exam.  Recent labs,  are reviewed. Immunization is reviewed , and  updated if needed.    PE; BP 130/82   Pulse 88   Resp 14   Ht 6' (1.829 m)   Wt 297 lb (134.7 kg)   SpO2 97% Comment: room air  BMI 40.28 kg/m   Pleasant male, alert and oriented x 3, in no cardio-pulmonary distress. Afebrile. HEENT No facial trauma or asymetry. Sinuses non tender. EOMI, pupils equally reactive to light. External ears normal, tympanic membranes clear. Oropharynx moist, no exudate.Poor dentition Neck: supple, no adenopathy,JVD or thyromegaly.No bruits.  Chest: Clear to ascultation bilaterally.No crackles or wheezes. Non tender to palpation  Breast: No asymetry,no masses. No nipple discharge or inversion. No axillary or supraclavicular adenopathy  Cardiovascular system; Heart sounds normal,  S1 and  S2 ,no S3.  No murmur, or thrill. Apical beat not displaced Peripheral pulses normal.  Abdomen: Soft, non tender, no organomegaly or masses. No bruits. Bowel sounds normal. No guarding, tenderness or rebound.    Musculoskeletal exam: Decreased ROM of spine, hips , shoulders and knees.  deformity ,swelling and  crepitus noted. No muscle wasting or atrophy.   Neurologic: Cranial nerves 2 to 12 intact. Power, tone ,sensation and reflexes normal throughout. No disturbance in gait. No tremor.  Skin: Intact, no ulceration, erythema , scaling or rash noted. Pigmentation normal throughout  Psych; Normal mood and affect. Judgement and concentration normal   Assessment & Plan:

## 2018-11-07 NOTE — Assessment & Plan Note (Signed)
Asked:confirms currently smokes cigarettes Assess: Unwilling to set  quit date ,but cutting back Advise: needs to QUIT to reduce risk of cancer, cardio and cerebrovascular disease Assist: counseled for 5 minutes and literature provided Arrange: follow up in 3 months

## 2018-11-07 NOTE — Assessment & Plan Note (Signed)
Deteriorated.  Patient re-educated about  the importance of commitment to a  minimum of 150 minutes of exercise per week as able.  The importance of healthy food choices with portion control discussed, as well as eating regularly and within a 12 hour window most days. The need to choose "clean , green" food 50 to 75% of the time is discussed, as well as to make water the primary drink and set a goal of 64 ounces water daily.  Encouraged to start a food diary,  and to consider  joining a support group. Sample diet sheets offered. Goals set by the patient for the next several months.   Weight /BMI 11/06/2018 08/06/2018 05/30/2018  WEIGHT 297 lb 295 lb 296 lb  HEIGHT 6\' 0"  6\' 0"  6\' 0"   BMI 40.28 kg/m2 40.01 kg/m2 40.14 kg/m2

## 2018-11-07 NOTE — Assessment & Plan Note (Signed)
Hyperlipidemia:Low fat diet discussed and encouraged.   Lipid Panel  Lab Results  Component Value Date   CHOL 191 11/05/2018   HDL 43 11/05/2018   LDLCALC 115 (H) 11/05/2018   TRIG 209 (H) 11/05/2018   CHOLHDL 4.4 11/05/2018   Not at goal, change in food choice , no med change

## 2018-11-07 NOTE — Assessment & Plan Note (Signed)
Controlled, no change in medication Dakota Mendez is reminded of the importance of commitment to daily physical activity for 30 minutes or more, as able and the need to limit carbohydrate intake to 30 to 60 grams per meal to help with blood sugar control.   The need to take medication as prescribed, test blood sugar as directed, and to call between visits if there is a concern that blood sugar is uncontrolled is also discussed.   Dakota Mendez is reminded of the importance of daily foot exam, annual eye examination, and good blood sugar, blood pressure and cholesterol control.  Diabetic Labs Latest Ref Rng & Units 11/05/2018 05/31/2018 11/26/2017 07/25/2017 07/24/2017  HbA1c <5.7 % of total Hgb 7.3(H) 7.4(H) 7.2(H) - 6.8(H)  Microalbumin Not Estab. ug/mL - - - 19.6(H) -  Micro/Creat Ratio 0.0 - 30.0 mg/g creat - - - 20.0 -  Chol <200 mg/dL 191 179 175 - -  HDL > OR = 40 mg/dL 43 38(L) 40(L) - -  Calc LDL mg/dL (calc) 115(H) 100(H) 109(H) - -  Triglycerides <150 mg/dL 209(H) 288(H) 150(H) - -  Creatinine 0.70 - 1.33 mg/dL 1.22 1.13 0.98 - 0.93   BP/Weight 11/06/2018 08/06/2018 05/30/2018 03/27/2018 11/27/2017 08/08/2017 63/89/3734  Systolic BP 287 681 157 262 035 597 416  Diastolic BP 82 84 84 84 90 89 86  Wt. (Lbs) 297 295 296 296.75 294.12 295 295  BMI 40.28 40.01 40.14 40.25 39.89 40.01 40.01   Foot/eye exam completion dates Latest Ref Rng & Units 08/06/2018 07/25/2017  Eye Exam No Retinopathy - -  Foot exam Order - - -  Foot Form Completion - Done Done

## 2018-11-07 NOTE — Assessment & Plan Note (Signed)

## 2018-11-20 ENCOUNTER — Other Ambulatory Visit: Payer: Self-pay | Admitting: Family Medicine

## 2018-11-20 DIAGNOSIS — I10 Essential (primary) hypertension: Secondary | ICD-10-CM

## 2019-01-22 ENCOUNTER — Encounter: Payer: Self-pay | Admitting: *Deleted

## 2019-03-06 ENCOUNTER — Other Ambulatory Visit: Payer: Self-pay

## 2019-03-06 ENCOUNTER — Other Ambulatory Visit: Payer: Medicare HMO

## 2019-03-06 DIAGNOSIS — R6889 Other general symptoms and signs: Secondary | ICD-10-CM | POA: Diagnosis not present

## 2019-03-06 DIAGNOSIS — Z20822 Contact with and (suspected) exposure to covid-19: Secondary | ICD-10-CM

## 2019-03-10 ENCOUNTER — Telehealth: Payer: Self-pay | Admitting: Family Medicine

## 2019-03-10 ENCOUNTER — Encounter: Payer: Self-pay | Admitting: Family Medicine

## 2019-03-10 NOTE — Telephone Encounter (Signed)
Pt is Positive for Covid

## 2019-03-10 NOTE — Telephone Encounter (Signed)
Has questions regarding Covid results

## 2019-03-11 NOTE — Telephone Encounter (Signed)
Disregard per [place]. [facility] has contacted the patient with his results

## 2019-03-12 ENCOUNTER — Other Ambulatory Visit: Payer: Self-pay | Admitting: Family Medicine

## 2019-03-13 LAB — NOVEL CORONAVIRUS, NAA: SARS-CoV-2, NAA: DETECTED — AB

## 2019-03-28 ENCOUNTER — Other Ambulatory Visit: Payer: Self-pay | Admitting: Family Medicine

## 2019-03-28 DIAGNOSIS — I10 Essential (primary) hypertension: Secondary | ICD-10-CM

## 2019-04-01 ENCOUNTER — Other Ambulatory Visit: Payer: Self-pay

## 2019-04-01 ENCOUNTER — Encounter: Payer: Self-pay | Admitting: Family Medicine

## 2019-04-01 ENCOUNTER — Ambulatory Visit (INDEPENDENT_AMBULATORY_CARE_PROVIDER_SITE_OTHER): Payer: Medicare HMO | Admitting: Family Medicine

## 2019-04-01 VITALS — BP 130/82 | HR 88 | Resp 14 | Ht 72.0 in | Wt 297.0 lb

## 2019-04-01 DIAGNOSIS — Z0000 Encounter for general adult medical examination without abnormal findings: Secondary | ICD-10-CM | POA: Diagnosis not present

## 2019-04-01 NOTE — Progress Notes (Signed)
Subjective:   Dakota Mendez is a 58 y.o. male who presents for Medicare Annual/Subsequent preventive examination.  Location of Patient: Home Location of Provider: Telehealth Consent was obtain for visit to be over via telehealth. I verified that I am speaking with the correct person using two identifiers.  Review of Systems:   Cardiac Risk Factors include: advanced age (>7men, >77 women);diabetes mellitus;dyslipidemia;hypertension;obesity (BMI >30kg/m2)     Objective:    Vitals: BP 130/82   Pulse 88   Resp 14   Ht 6' (1.829 m)   Wt 297 lb (134.7 kg)   BMI 40.28 kg/m   Body mass index is 40.28 kg/m.  Advanced Directives 03/27/2018 08/08/2017 01/08/2017 08/24/2015 03/04/2015 02/17/2015  Does Patient Have a Medical Advance Directive? Yes No No No No No  Type of Paramedic of [place];Living will - - - - -  Copy of [place] in Chart? No - copy requested - - - - -  Would patient like information on creating a medical advance directive? - No - Patient declined Yes (MAU/Ambulatory/Procedural Areas - Information given) No - patient declined information No - patient declined information No - patient declined information    Tobacco Social History   Tobacco Use  Smoking Status Current Every Day Smoker  . Packs/day: 0.50  . Years: 40.00  . Pack years: 20.00  . Types: Cigarettes  Smokeless Tobacco Never Used  Tobacco Comment   1 PPD for 35 years     Ready to quit: Yes Counseling given: Yes Comment: 1 PPD for 35 years   Clinical Intake:  Pre-visit preparation completed: Yes  Pain : 0-10 Pain Score: 4  Pain Type: Chronic pain Pain Location: Neck Pain Descriptors / Indicators: Stabbing Pain Onset: More than a month ago Pain Frequency: Other (Comment)(almost every day when he holds neck a certain way) Pain Relieving Factors: moving neck a different way, getting up and moving around Effect of Pain on Daily Activities: sometimes   Pain Relieving Factors: moving neck a different way, getting up and moving around  BMI - recorded: 40.28 Nutritional Status: BMI > 30  Obese Nutritional Risks: None Diabetes: Yes CBG done?: No Did pt. bring in CBG monitor from home?: No  How often do you need to have someone help you when you read instructions, pamphlets, or other written materials from your doctor or pharmacy?: 1 - Never What is the last grade level you completed in school?: 10  Interpreter Needed?: No     Past Medical History:  Diagnosis Date  . Chronic back pain   . Chronic neck pain   . Diabetes mellitus   . Hyperlipidemia   . Hypertension   . Obesity   . Sleep apnea    states he does not wear his CPAP  . Tobacco abuse    Past Surgical History:  Procedure Laterality Date  . CERVICAL SPINE SURGERY    . COLONOSCOPY N/A 08/08/2017   Procedure: COLONOSCOPY;  Surgeon: Rogene Houston, MD;  Location: [facility];  Service: Endoscopy;  Laterality: N/A;  930  . SPINE SURGERY  09/21/2011   cervial spine   Family History  Problem Relation Age of Onset  . Cirrhosis Father   . Heart attack Mother   . Depression Mother   . Stroke Mother   . Hearing loss Brother   . Cirrhosis Brother   . Diabetes Sister   . Sleep apnea Son   . Hypertension Son  Social History   Socioeconomic History  . Marital status: Married    Spouse name: Not on file  . Number of children: 4  . Years of education: Not on file  . Highest education level: 10th grade  Occupational History  . Occupation: disabled-truck dirver  Social Needs  . Financial resource strain: Not hard at all  . Food insecurity    Worry: Never true    Inability: Never true  . Transportation needs    Medical: No    Non-medical: No  Tobacco Use  . Smoking status: Current Every Day Smoker    Packs/day: 0.50    Years: 40.00    Pack years: 20.00    Types: Cigarettes  . Smokeless tobacco: Never Used  . Tobacco comment: 1 PPD for 35 years   Substance and Sexual Activity  . Alcohol use: No  . Drug use: No  . Sexual activity: Yes  Lifestyle  . Physical activity    Days per week: 0 days    Minutes per session: 0 min  . Stress: Not at all  Relationships  . Social connections    Talks on phone: More than three times a week    Gets together: More than three times a week    Attends religious service: More than 4 times per year    Active member of club or organization: No    Attends meetings of clubs or organizations: Never    Relationship status: Married  Other Topics Concern  . Not on file  Social History Narrative  . Not on file    Outpatient Encounter Medications as of 04/01/2019  Medication Sig  . amLODipine (NORVASC) 10 MG tablet TAKE 1 TABLET EVERY DAY  . cloNIDine (CATAPRES) 0.2 MG tablet TAKE 1 TABLET EVERY DAY  . glipiZIDE (GLUCOTROL XL) 5 MG 24 hr tablet TAKE 1 TABLET EVERY DAY WITH BREAKFAST  (DECREASE  IN  DOSE)  . glucose blood (ONE TOUCH ULTRA TEST) test strip Use as instructed for once daily testing  Dx 250.00 length of need- 99 months  . hydrochlorothiazide (MICROZIDE) 12.5 MG capsule TAKE 1 CAPSULE (12.5 MG TOTAL) BY MOUTH DAILY.  Marland Kitchen ibuprofen (ADVIL,MOTRIN) 800 MG tablet TAKE 1 TABLET TWICE DAILY AS NEEDED FOR PAIN  . losartan-hydrochlorothiazide (HYZAAR) 100-25 MG tablet TAKE 1 TABLET EVERY DAY  . metFORMIN (GLUCOPHAGE) 1000 MG tablet TAKE 1 TABLET TWICE DAILY WITH A MEAL  . ONETOUCH DELICA LANCETS 28U MISC 1 each by Does not apply route daily. For once daily testing DX 250.00 Length of need- 99 months  . potassium chloride (K-DUR) 10 MEQ tablet TAKE 1 TABLET (10 MEQ TOTAL) BY MOUTH DAILY.  . pravastatin (PRAVACHOL) 40 MG tablet TAKE 1 TABLET DAILY (DOSE INCREASE 06/10/18)   No facility-administered encounter medications on file as of 04/01/2019.     Activities of Daily Living In your present state of health, do you have any difficulty performing the following activities: 04/01/2019  Hearing? N   Vision? N  Difficulty concentrating or making decisions? N  Walking or climbing stairs? N  Dressing or bathing? N  Doing errands, shopping? N  Preparing Food and eating ? N  Using the Toilet? N  In the past six months, have you accidently leaked urine? N  Do you have problems with loss of bowel control? N  Managing your Medications? N  Managing your Finances? N  Housekeeping or managing your Housekeeping? N  Some recent data might be hidden    Patient Care  Team: Fayrene Helper, MD as PCP - General   Assessment:   This is a routine wellness examination for Darric.  Exercise Activities and Dietary recommendations Current Exercise Habits: Home exercise routine, Type of exercise: walking, Time (Minutes): 20, Frequency (Times/Week): 4, Weekly Exercise (Minutes/Week): 80, Intensity: Moderate, Exercise limited by: None identified  Goals    . Exercise 3x per week (30 min per time)     Recommend starting a routine exercise program at least 3 days a week for 30-45 minutes at a time as tolerated.         Fall Risk Fall Risk  04/01/2019 11/06/2018 08/06/2018 05/30/2018 03/27/2018  Falls in the past year? 1 0 0 No No  Number falls in past yr: 0 0 - - -  Injury with Fall? 1 0 - - -  Risk for fall due to : - Impaired balance/gait;Impaired vision - - -  Follow up - Falls evaluation completed - - -   Is the patient's home free of loose throw rugs in walkways, pet beds, electrical cords, etc?   yes      Grab bars in the bathroom? no      Handrails on the stairs?   yes      Adequate lighting?   yes  Timed Get Up and Go Performed: n/a telemed visit   Depression Screen PHQ 2/9 Scores 04/01/2019 11/06/2018 08/06/2018 05/30/2018  PHQ - 2 Score 0 0 0 0  PHQ- 9 Score - - - -    Cognitive Function     6CIT Screen 04/01/2019 03/27/2018 01/08/2017  What Year? 0 points 0 points 0 points  What month? 0 points 0 points 0 points  What time? 0 points 0 points 0 points  Count back from 20 0 points  0 points 0 points  Months in reverse 0 points 0 points 0 points  Repeat phrase 0 points 0 points 0 points  Total Score 0 0 0    Immunization History  Administered Date(s) Administered  . Influenza Whole 10/11/2007, 05/17/2011  . Influenza,inj,Quad PF,6+ Mos 06/13/2013, 06/18/2014, 07/22/2015, 06/01/2016, 07/25/2017, 05/30/2018  . Pneumococcal Conjugate-13 12/15/2014  . Pneumococcal Polysaccharide-23 04/05/2011, 06/01/2016  . Td 09/21/2006    Qualifies for Shingles Vaccine? Has not had   Screening Tests Health Maintenance  Topic Date Due  . TETANUS/TDAP  09/21/2016  . OPHTHALMOLOGY EXAM  03/13/2018  . INFLUENZA VACCINE  04/12/2019  . HEMOGLOBIN A1C  05/06/2019  . FOOT EXAM  11/08/2019  . COLONOSCOPY  08/09/2027  . PNEUMOCOCCAL POLYSACCHARIDE VACCINE AGE 35-64 HIGH RISK  Completed  . Hepatitis C Screening  Completed  . HIV Screening  Completed   Cancer Screenings: Lung: Low Dose CT Chest recommended if Age 75-80 years, 30 pack-year currently smoking OR have quit w/in 15years. Patient does qualify. Colorectal: Due 2028  Additional Screenings:  Hepatitis C Screening: complete      Plan:     1. Encounter for Medicare annual wellness exam  I have personally reviewed and noted the following in the patient's chart:   . Medical and social history . Use of alcohol, tobacco or illicit drugs  . Current medications and supplements . Functional ability and status . Nutritional status . Physical activity . Advanced directives . List of other physicians . Hospitalizations, surgeries, and ER visits in previous 12 months . Vitals . Screenings to include cognitive, depression, and falls . Referrals and appointments  In addition, I have reviewed and discussed with patient certain preventive protocols, quality  metrics, and best practice recommendations. A written personalized care plan for preventive services as well as general preventive health recommendations were provided to  patient.   I provided 20 minutes of non-face-to-face time during this encounter.   Perlie Mayo, NP  04/01/2019

## 2019-04-01 NOTE — Patient Instructions (Signed)
Dakota Mendez , Thank you for taking time to come for your Medicare Wellness Visit. I appreciate your ongoing commitment to your health goals. Please review the following plan we discussed and let me know if I can assist you in the future.   Please continue to practice social distancing to keep you, your family, and our community safe.  If you must go out, please wear a Mask and practice good handwashing.  Screening recommendations/referrals: Colonoscopy: Due 2028 Recommended yearly ophthalmology/optometry visit for glaucoma screening and checkup Recommended yearly dental visit for hygiene and checkup  Vaccinations: Influenza vaccine: Due Fall 2020 Pneumococcal vaccine: Completed  Tdap vaccine: Was due 2018 Shingles vaccine: Due in the coming years  Advanced directives: Speak with Dr Moshe Cipro about this and wife  Conditions/risks identified: Fall   Next appointment: 05/27/2019  Preventive Care 40-64 Years, Male Preventive care refers to lifestyle choices and visits with your health care provider that can promote health and wellness. What does preventive care include?  A yearly physical exam. This is also called an annual well check.  Dental exams once or twice a year.  Routine eye exams. Ask your health care provider how often you should have your eyes checked.  Personal lifestyle choices, including:  Daily care of your teeth and gums.  Regular physical activity.  Eating a healthy diet.  Avoiding tobacco and drug use.  Limiting alcohol use.  Practicing safe sex.  Taking low-dose aspirin every day starting at age 55. What happens during an annual well check? The services and screenings done by your health care provider during your annual well check will depend on your age, overall health, lifestyle risk factors, and family history of disease. Counseling  Your health care provider may ask you questions about your:  Alcohol use.  Tobacco use.  Drug use.  Emotional  well-being.  Home and relationship well-being.  Sexual activity.  Eating habits.  Work and work Statistician. Screening  You may have the following tests or measurements:  Height, weight, and BMI.  Blood pressure.  Lipid and cholesterol levels. These may be checked every 5 years, or more frequently if you are over 31 years old.  Skin check.  Lung cancer screening. You may have this screening every year starting at age 41 if you have a 30-pack-year history of smoking and currently smoke or have quit within the past 15 years.  Fecal occult blood test (FOBT) of the stool. You may have this test every year starting at age 22.  Flexible sigmoidoscopy or colonoscopy. You may have a sigmoidoscopy every 5 years or a colonoscopy every 10 years starting at age 2.  Prostate cancer screening. Recommendations will vary depending on your family history and other risks.  Hepatitis C blood test.  Hepatitis B blood test.  Sexually transmitted disease (STD) testing.  Diabetes screening. This is done by checking your blood sugar (glucose) after you have not eaten for a while (fasting). You may have this done every 1-3 years. Discuss your test results, treatment options, and if necessary, the need for more tests with your health care provider. Vaccines  Your health care provider may recommend certain vaccines, such as:  Influenza vaccine. This is recommended every year.  Tetanus, diphtheria, and acellular pertussis (Tdap, Td) vaccine. You may need a Td booster every 10 years.  Zoster vaccine. You may need this after age 21.  Pneumococcal 13-valent conjugate (PCV13) vaccine. You may need this if you have certain conditions and have not been vaccinated.  Pneumococcal polysaccharide (PPSV23) vaccine. You may need one or two doses if you smoke cigarettes or if you have certain conditions. Talk to your health care provider about which screenings and vaccines you need and how often you need  them. This information is not intended to replace advice given to you by your health care provider. Make sure you discuss any questions you have with your health care provider. Document Released: 09/24/2015 Document Revised: 05/17/2016 Document Reviewed: 06/29/2015 Elsevier Interactive Patient Education  2017 [employer] Prevention in the Home Falls can cause injuries. They can happen to people of all ages. There are many things you can do to make your home safe and to help prevent falls. What can I do on the outside of my home?  Regularly fix the edges of walkways and driveways and fix any cracks.  Remove anything that might make you trip as you walk through a door, such as a raised step or threshold.  Trim any bushes or trees on the path to your home.  Use bright outdoor lighting.  Clear any walking paths of anything that might make someone trip, such as rocks or tools.  Regularly check to see if handrails are loose or broken. Make sure that both sides of any steps have handrails.  Any raised decks and porches should have guardrails on the edges.  Have any leaves, snow, or ice cleared regularly.  Use sand or salt on walking paths during winter.  Clean up any spills in your garage right away. This includes oil or grease spills. What can I do in the bathroom?  Use night lights.  Install grab bars by the toilet and in the tub and shower. Do not use towel bars as grab bars.  Use non-skid mats or decals in the tub or shower.  If you need to sit down in the shower, use a plastic, non-slip stool.  Keep the floor dry. Clean up any water that spills on the floor as soon as it happens.  Remove soap buildup in the tub or shower regularly.  Attach bath mats securely with double-sided non-slip rug tape.  Do not have throw rugs and other things on the floor that can make you trip. What can I do in the bedroom?  Use night lights.  Make sure that you have a light by your  bed that is easy to reach.  Do not use any sheets or blankets that are too big for your bed. They should not hang down onto the floor.  Have a firm chair that has side arms. You can use this for support while you get dressed.  Do not have throw rugs and other things on the floor that can make you trip. What can I do in the kitchen?  Clean up any spills right away.  Avoid walking on wet floors.  Keep items that you use a lot in easy-to-reach places.  If you need to reach something above you, use a strong step stool that has a grab bar.  Keep electrical cords out of the way.  Do not use floor polish or wax that makes floors slippery. If you must use wax, use non-skid floor wax.  Do not have throw rugs and other things on the floor that can make you trip. What can I do with my stairs?  Do not leave any items on the stairs.  Make sure that there are handrails on both sides of the stairs and use them. Fix handrails that  are broken or loose. Make sure that handrails are as long as the stairways.  Check any carpeting to make sure that it is firmly attached to the stairs. Fix any carpet that is loose or worn.  Avoid having throw rugs at the top or bottom of the stairs. If you do have throw rugs, attach them to the floor with carpet tape.  Make sure that you have a light switch at the top of the stairs and the bottom of the stairs. If you do not have them, ask someone to add them for you. What else can I do to help prevent falls?  Wear shoes that:  Do not have high heels.  Have rubber bottoms.  Are comfortable and fit you well.  Are closed at the toe. Do not wear sandals.  If you use a stepladder:  Make sure that it is fully opened. Do not climb a closed stepladder.  Make sure that both sides of the stepladder are locked into place.  Ask someone to hold it for you, if possible.  Clearly mark and make sure that you can see:  Any grab bars or handrails.  First and last  steps.  Where the edge of each step is.  Use tools that help you move around (mobility aids) if they are needed. These include:  Canes.  Walkers.  Scooters.  Crutches.  Turn on the lights when you go into a dark area. Replace any light bulbs as soon as they burn out.  Set up your furniture so you have a clear path. Avoid moving your furniture around.  If any of your floors are uneven, fix them.  If there are any pets around you, be aware of where they are.  Review your medicines with your doctor. Some medicines can make you feel dizzy. This can increase your chance of falling. Ask your doctor what other things that you can do to help prevent falls. This information is not intended to replace advice given to you by your health care provider. Make sure you discuss any questions you have with your health care provider. Document Released: 06/24/2009 Document Revised: 02/03/2016 Document Reviewed: 10/02/2014 Elsevier Interactive Patient Education  2017 [employer].

## 2019-05-05 ENCOUNTER — Ambulatory Visit: Payer: Medicare HMO

## 2019-05-27 ENCOUNTER — Ambulatory Visit: Payer: Medicare HMO | Admitting: Family Medicine

## 2019-06-09 ENCOUNTER — Ambulatory Visit (INDEPENDENT_AMBULATORY_CARE_PROVIDER_SITE_OTHER): Payer: Medicare HMO

## 2019-06-09 ENCOUNTER — Other Ambulatory Visit: Payer: Self-pay

## 2019-06-09 DIAGNOSIS — Z23 Encounter for immunization: Secondary | ICD-10-CM | POA: Diagnosis not present

## 2019-06-19 ENCOUNTER — Other Ambulatory Visit: Payer: Self-pay | Admitting: Family Medicine

## 2019-07-10 ENCOUNTER — Ambulatory Visit: Payer: Medicare HMO | Admitting: Family Medicine

## 2019-07-18 ENCOUNTER — Ambulatory Visit (INDEPENDENT_AMBULATORY_CARE_PROVIDER_SITE_OTHER): Payer: Medicare HMO | Admitting: Family Medicine

## 2019-07-18 ENCOUNTER — Other Ambulatory Visit: Payer: Self-pay

## 2019-07-18 ENCOUNTER — Encounter: Payer: Self-pay | Admitting: Family Medicine

## 2019-07-18 VITALS — BP 140/88 | HR 100 | Temp 98.7°F | Resp 15 | Ht 72.0 in | Wt 288.1 lb

## 2019-07-18 DIAGNOSIS — I10 Essential (primary) hypertension: Secondary | ICD-10-CM

## 2019-07-18 DIAGNOSIS — M25512 Pain in left shoulder: Secondary | ICD-10-CM | POA: Diagnosis not present

## 2019-07-18 DIAGNOSIS — M79602 Pain in left arm: Secondary | ICD-10-CM | POA: Diagnosis not present

## 2019-07-18 MED ORDER — IBUPROFEN 800 MG PO TABS: ORAL_TABLET | ORAL | 0 refills | Status: DC

## 2019-07-18 NOTE — Patient Instructions (Signed)
  I appreciate the opportunity to provide you with the care for your health and wellness. Today we discussed: sore arm and shoulder  Follow up: 08/13/2019  No labs or referrals today  Ibuprofen sent in, please use only when needed. You can use tylenol as well to help with pain.  I am glad arm is feeling better.  Avoid lifting heavy items until fully recovered.  It takes about 12 weeks for muscles and joints to feel better after injury or soreness from something.   Please continue to practice social distancing to keep you, your family, and our community safe.  If you must go out, please wear a mask and practice good handwashing.  It was a pleasure to see you and I look forward to continuing to work together on your health and well-being. Please do not hesitate to call the office if you need care or have questions about your care.  Have a wonderful day and week. With Gratitude, Cherly Beach, DNP, AGNP-BC

## 2019-07-18 NOTE — Progress Notes (Signed)
Subjective:     Patient ID: Dakota Mendez, male   DOB: 1961-04-25, 57 y.o.   MRN: [id number]  Dakota Mendez presents for Arm Pain and Shoulder Pain  Dakota Mendez is a 58 year old male patient of Dr. Griffin Dakin.  Presents today with some ongoing arm and shoulder pain.  Reports that it started about a month ago.  Reports that he has not had any injury or trauma to it but that he does lay on that side when he watches TV.  He also reports that he has had some issues with that arm in the past secondary to having to have a spinal surgery-cervical.  He says it hurts sometimes when he reaches out quickly or raise his arm quickly.  He denies having any radiation into the jaw chest or back denies having any sweating denies having any relationship to heart attack or discomfort that could also be related to possible MI.  Usually only aggravates him when he is raising it has difficulty with pulling his arm back behind his back.  And range of motion is limited.  But this is been the that way since he had his cervical surgery.  Reports just leaving it alone and not reaching out helps it.  Does use occasional ibuprofen for it needs refill of this if possible.  Right now he he reports it is a 0 out of 10 with discomfort.  He reports that it is gotten better over the last month.  And continues to feel better each day.  Today patient denies signs and symptoms of COVID 19 infection including fever, chills, cough, shortness of breath, and headache.  Past Medical, Surgical, Social History, Allergies, and Medications have been Reviewed.   Past Medical History:  Diagnosis Date  . Chronic back pain   . Chronic neck pain   . Diabetes mellitus   . Hyperlipidemia   . Hypertension   . Obesity   . Sleep apnea    states he does not wear his CPAP  . Tobacco abuse    Past Surgical History:  Procedure Laterality Date  . CERVICAL SPINE SURGERY    . COLONOSCOPY N/A 08/08/2017   Procedure: COLONOSCOPY;  Surgeon:  Rogene Houston, MD;  Location: [facility];  Service: Endoscopy;  Laterality: N/A;  930  . SPINE SURGERY  09/21/2011   cervial spine   Social History   Socioeconomic History  . Marital status: Married    Spouse name: Not on file  . Number of children: 4  . Years of education: Not on file  . Highest education level: 10th grade  Occupational History  . Occupation: disabled-truck dirver  Social Needs  . Financial resource strain: Not hard at all  . Food insecurity    Worry: Never true    Inability: Never true  . Transportation needs    Medical: No    Non-medical: No  Tobacco Use  . Smoking status: Current Every Day Smoker    Packs/day: 0.50    Years: 40.00    Pack years: 20.00    Types: Cigarettes  . Smokeless tobacco: Never Used  . Tobacco comment: 1 PPD for 35 years  Substance and Sexual Activity  . Alcohol use: No  . Drug use: No  . Sexual activity: Yes  Lifestyle  . Physical activity    Days per week: 0 days    Minutes per session: 0 min  . Stress: Not at all  Relationships  . Social connections  Talks on phone: More than three times a week    Gets together: More than three times a week    Attends religious service: More than 4 times per year    Active member of club or organization: No    Attends meetings of clubs or organizations: Never    Relationship status: Married  . Intimate partner violence    Fear of current or ex partner: No    Emotionally abused: No    Physically abused: No    Forced sexual activity: No  Other Topics Concern  . Not on file  Social History Narrative  . Not on file    Outpatient Encounter Medications as of 07/18/2019  Medication Sig  . amLODipine (NORVASC) 10 MG tablet TAKE 1 TABLET EVERY DAY  . cloNIDine (CATAPRES) 0.2 MG tablet TAKE 1 TABLET EVERY DAY  . glipiZIDE (GLUCOTROL XL) 5 MG 24 hr tablet TAKE 1 TABLET EVERY DAY WITH BREAKFAST  (DECREASE  IN  DOSE)  . glucose blood (ONE TOUCH ULTRA TEST) test strip Use as  instructed for once daily testing  Dx 250.00 length of need- 99 months  . hydrochlorothiazide (MICROZIDE) 12.5 MG capsule TAKE 1 CAPSULE (12.5 MG TOTAL) BY MOUTH DAILY.  Marland Kitchen ibuprofen (ADVIL) 800 MG tablet TAKE 1 TABLET TWICE DAILY AS NEEDED FOR PAIN  . losartan-hydrochlorothiazide (HYZAAR) 100-25 MG tablet TAKE 1 TABLET EVERY DAY  . metFORMIN (GLUCOPHAGE) 1000 MG tablet TAKE 1 TABLET TWICE DAILY WITH MEALS  . ONETOUCH DELICA LANCETS 99991111 MISC 1 each by Does not apply route daily. For once daily testing DX 250.00 Length of need- 99 months  . potassium chloride (KLOR-CON) 10 MEQ tablet TAKE 1 TABLET (10 MEQ TOTAL) BY MOUTH DAILY.  . pravastatin (PRAVACHOL) 40 MG tablet TAKE 1 TABLET DAILY (DOSE INCREASE 06/10/18)  . [DISCONTINUED] ibuprofen (ADVIL,MOTRIN) 800 MG tablet TAKE 1 TABLET TWICE DAILY AS NEEDED FOR PAIN   No facility-administered encounter medications on file as of 07/18/2019.    No Known Allergies  Review of Systems  Constitutional: Negative for chills and fever.  HENT: Negative.   Eyes: Negative.   Respiratory: Negative.   Cardiovascular: Negative.   Gastrointestinal: Negative.   Endocrine: Negative.   Genitourinary: Negative.   Musculoskeletal: Positive for arthralgias and myalgias.  Skin: Negative.   Allergic/Immunologic: Negative.   Neurological: Negative.   Hematological: Negative.   Psychiatric/Behavioral: Negative.   All other systems reviewed and are negative.      Objective:     BP 140/88   Pulse 100   Temp 98.7 F (37.1 C) (Oral)   Resp 15   Ht 6' (1.829 m)   Wt 288 lb 1.9 oz (130.7 kg)   SpO2 97%   BMI 39.08 kg/m   Physical Exam Vitals signs and nursing note reviewed.  Constitutional:      Appearance: Normal appearance. He is obese.  HENT:     Head: Normocephalic and atraumatic.     Right Ear: External ear normal.     Left Ear: External ear normal.     Nose: Nose normal.     Mouth/Throat:     Pharynx: Oropharynx is clear.  Eyes:      General:        Right eye: No discharge.        Left eye: No discharge.     Conjunctiva/sclera: Conjunctivae normal.  Neck:     Musculoskeletal: Normal range of motion and neck supple.  Cardiovascular:     Rate and  Rhythm: Normal rate and regular rhythm.     Pulses: Normal pulses.     Heart sounds: Normal heart sounds.  Pulmonary:     Effort: Pulmonary effort is normal.     Breath sounds: Normal breath sounds.  Musculoskeletal:     Left shoulder: He exhibits decreased range of motion and tenderness.  Skin:    General: Skin is warm.  Neurological:     General: No focal deficit present.     Mental Status: He is alert and oriented to person, place, and time.  Psychiatric:        Mood and Affect: Mood normal.        Behavior: Behavior normal.        Thought Content: Thought content normal.        Judgment: Judgment normal.        Assessment and Plan       1. Essential hypertension Dakota Mendez is encouraged to maintain a well balanced diet that is low in salt. Controlled, continue current medication regimen. No refills needed.  Additionally, he is also reminded that exercise is beneficial for heart health and control of  Blood pressure. 30-60 minutes daily is recommended-walking was suggested.  2. Acute pain of left shoulder Signs and symptoms are consistent with reduced range of motion possibly secondary to a cervical surgery he has had in the past.  There is nothing new outside of discomfort in the left shoulder that radiates into the deltoid upper arm.  Overall reports that it is improving over the last month.  Advised to continue resting it not lifting extremely heavy weight and using ibuprofen and rotation with Tylenol to help with inflammation and pain.  3. Left arm pain See # 3  Follow-up: 08/13/2019  Perlie Mayo, DNP, AGNP-BC [address], [place], [place] [id number] Office Hours: Mon-Thurs 8  am-5 pm; Fri 8 am-12 pm Office Phone:  [phone]  Office Fax: [phone]

## 2019-08-12 ENCOUNTER — Other Ambulatory Visit: Payer: Self-pay | Admitting: Family Medicine

## 2019-08-12 DIAGNOSIS — E119 Type 2 diabetes mellitus without complications: Secondary | ICD-10-CM | POA: Diagnosis not present

## 2019-08-12 DIAGNOSIS — E785 Hyperlipidemia, unspecified: Secondary | ICD-10-CM | POA: Diagnosis not present

## 2019-08-12 DIAGNOSIS — I10 Essential (primary) hypertension: Secondary | ICD-10-CM | POA: Diagnosis not present

## 2019-08-12 DIAGNOSIS — Z125 Encounter for screening for malignant neoplasm of prostate: Secondary | ICD-10-CM | POA: Diagnosis not present

## 2019-08-13 ENCOUNTER — Encounter: Payer: Self-pay | Admitting: Family Medicine

## 2019-08-13 ENCOUNTER — Ambulatory Visit (INDEPENDENT_AMBULATORY_CARE_PROVIDER_SITE_OTHER): Payer: Medicare HMO | Admitting: Family Medicine

## 2019-08-13 ENCOUNTER — Other Ambulatory Visit: Payer: Self-pay

## 2019-08-13 VITALS — BP 120/84 | HR 106 | Temp 98.5°F | Resp 15 | Ht 72.0 in | Wt 291.0 lb

## 2019-08-13 DIAGNOSIS — G4733 Obstructive sleep apnea (adult) (pediatric): Secondary | ICD-10-CM

## 2019-08-13 DIAGNOSIS — F17200 Nicotine dependence, unspecified, uncomplicated: Secondary | ICD-10-CM | POA: Diagnosis not present

## 2019-08-13 DIAGNOSIS — E1159 Type 2 diabetes mellitus with other circulatory complications: Secondary | ICD-10-CM

## 2019-08-13 DIAGNOSIS — E785 Hyperlipidemia, unspecified: Secondary | ICD-10-CM

## 2019-08-13 DIAGNOSIS — I10 Essential (primary) hypertension: Secondary | ICD-10-CM

## 2019-08-13 DIAGNOSIS — F17210 Nicotine dependence, cigarettes, uncomplicated: Secondary | ICD-10-CM

## 2019-08-13 DIAGNOSIS — Z0000 Encounter for general adult medical examination without abnormal findings: Secondary | ICD-10-CM

## 2019-08-13 LAB — COMPLETE METABOLIC PANEL WITH GFR
AG Ratio: 1.6 (calc) (ref 1.0–2.5)
ALT: 10 U/L (ref 9–46)
AST: 10 U/L (ref 10–35)
Albumin: 4.2 g/dL (ref 3.6–5.1)
Alkaline phosphatase (APISO): 56 U/L (ref 35–144)
BUN: 19 mg/dL (ref 7–25)
CO2: 26 mmol/L (ref 20–32)
Calcium: 9.4 mg/dL (ref 8.6–10.3)
Chloride: 101 mmol/L (ref 98–110)
Creat: 1.01 mg/dL (ref 0.70–1.33)
GFR, Est African American: 96 mL/min/{1.73_m2} (ref >=60)
GFR, Est Non African American: 83 mL/min/{1.73_m2} (ref >=60)
Globulin: 2.7 g/dL (calc) (ref 1.9–3.7)
Glucose, Bld: 117 mg/dL — ABNORMAL HIGH (ref 65–99)
Potassium: 3.8 mmol/L (ref 3.5–5.3)
Sodium: 139 mmol/L (ref 135–146)
Total Bilirubin: 0.8 mg/dL (ref 0.2–1.2)
Total Protein: 6.9 g/dL (ref 6.1–8.1)

## 2019-08-13 LAB — CBC
HCT: 42.1 % (ref 38.5–50.0)
Hemoglobin: 14.1 g/dL (ref 13.2–17.1)
MCH: 28.5 pg (ref 27.0–33.0)
MCHC: 33.5 g/dL (ref 32.0–36.0)
MCV: 85.1 fL (ref 80.0–100.0)
MPV: 12 fL (ref 7.5–12.5)
Platelets: 370 10*3/uL (ref 140–400)
RBC: 4.95 10*6/uL (ref 4.20–5.80)
RDW: 13.1 % (ref 11.0–15.0)
WBC: 12.2 10*3/uL — ABNORMAL HIGH (ref 3.8–10.8)

## 2019-08-13 LAB — LIPID PANEL
Cholesterol: 189 mg/dL (ref <200)
HDL: 42 mg/dL (ref >=40)
LDL Cholesterol (Calc): 115 mg/dL (calc) — ABNORMAL HIGH
Non-HDL Cholesterol (Calc): 147 mg/dL (calc) — ABNORMAL HIGH (ref <130)
Total CHOL/HDL Ratio: 4.5 (calc) (ref <5.0)
Triglycerides: 203 mg/dL — ABNORMAL HIGH (ref <150)

## 2019-08-13 LAB — PSA: PSA: 0.8 ng/mL (ref <=4.0)

## 2019-08-13 LAB — HEMOGLOBIN A1C W/OUT EAG: Hgb A1c MFr Bld: 6.9 % of total Hgb — ABNORMAL HIGH (ref <5.7)

## 2019-08-13 LAB — TSH: TSH: 3.11 mIU/L (ref 0.40–4.50)

## 2019-08-13 MED ORDER — PRAVASTATIN SODIUM 80 MG PO TABS: 80.0000 mg | ORAL_TABLET | Freq: Every day | ORAL | 3 refills | Status: DC

## 2019-08-13 MED ORDER — HYDROCHLOROTHIAZIDE 12.5 MG PO CAPS: 12.5000 mg | ORAL_CAPSULE | Freq: Every day | ORAL | 3 refills | Status: DC

## 2019-08-13 NOTE — Assessment & Plan Note (Signed)

## 2019-08-13 NOTE — Patient Instructions (Addendum)
Follow-up with MD in office in 4.5 months call if you need me sooner.  Fasting lipid, cmp and eGFR and hBA1C 4 days before next visit  Please reduce fried and fatty foods and you have an increase in dose of pravastatin to 80 mg once at bedtime.  Please take 2 pravastatin 40 mg tablets together until you get your new prescription for 80 mg 1 at bedtime.  Blood pressure and blood sugar are excellent continue current medication as you are taking it.  Continue to work on cutting back smoking with a plan to quitting.  He reports smoking 15 cigarettes/day now the goal is to get to 10 cigarettes/day in the next 4-1/2 months and I know that you can do this.  Continue to work on weight loss with a commitment to regular exercise as well as eating smaller portions of healthy food which consists of processed food which is not fried and with very little sugar in your diet.  Please practice home safety and sa on the road  Check your pharmacy you need TdAP  Reconsider re testing fpor sleep apnea, iof you have this it needs treatment to protect your heart and lungs  Weight loss wiill benefit all of your health

## 2019-08-13 NOTE — Progress Notes (Signed)
   Dakota Mendez     MRN: [id number]      DOB: 10/22/1960   HPI: Patient is in for annual physical exam. No other health concerns are expressed or addressed at the visit. Recent labs, if available are reviewed. Immunization is reviewed , and  updated if needed.    PE; BP 120/84   Pulse (!) 106   Temp 98.5 F (36.9 C) (Temporal)   Resp 15   Ht 6' (1.829 m)   Wt 291 lb (132 kg)   SpO2 97%   BMI 39.47 kg/m  Pleasant male, alert and oriented x 3, in no cardio-pulmonary distress. Afebrile. HEENT No facial trauma or asymetry. Sinuses non tender. EOMI External ears normal,  Neck: supple, no adenopathy,JVD or thyromegaly.No bruits.  Chest: Clear to ascultation bilaterally.No crackles or wheezes. Non tender to palpation  Cardiovascular system; Heart sounds normal,  S1 and  S2 ,no S3.  No murmur, or thrill. Apical beat not displaced Peripheral pulses normal.  Abdomen: Soft, non tender, no organomegaly or masses. No bruits. Bowel sounds normal. No guarding, tenderness or rebound.    Musculoskeletal exam: Full ROM of spine, hips ,  and knees.Reduced ROM left shoulder No deformity ,swelling or crepitus noted. No muscle wasting or atrophy.   Neurologic: Cranial nerves 2 to 12 intact. Power, tone ,sensation and reflexes normal throughout. No disturbance in gait. No tremor.  Skin: Intact, no ulceration, erythema , scaling or rash noted. Pigmentation normal throughout  Psych; Normal mood and affect. Judgement and concentration normal   Assessment & Plan:  Physical exam, annual Annual exam as documented. Counseling done  re healthy lifestyle involving commitment to 150 minutes exercise per week, heart healthy diet, and attaining healthy weight.The importance of adequate sleep also discussed. Regular seat belt use and home safety, is also discussed. Changes in health habits are decided on by the patient with goals and time frames  set for achieving them.  Immunization and cancer screening needs are specifically addressed at this visit.   Essential hypertension Controlled, no change in medication DASH diet and commitment to daily physical activity for a minimum of 30 minutes discussed and encouraged, as a part of hypertension management. The importance of attaining a healthy weight is also discussed.  BP/Weight 08/13/2019 07/18/2019 04/01/2019 11/06/2018 08/06/2018 05/30/2018 [id number]  Systolic BP 123456 [id number] 123456 0000000 [id number]  Diastolic BP 84 88 82 82 84 84 84  Wt. (Lbs) 291 288.12 297 297 295 296 296.75  BMI 39.47 39.08 40.28 40.28 40.01 40.14 40.25       Hyperlipidemia LDL goal <100 Uncontrolled , increase Pravachol dose and lower fat Hyperlipidemia:Low fat diet discussed and encouraged.   Lipid Panel  Lab Results  Component Value Date   CHOL 189 08/12/2019   HDL 42 08/12/2019   LDLCALC 115 (H) 08/12/2019   TRIG 203 (H) 08/12/2019   CHOLHDL 4.5 08/12/2019       Obstructive sleep apnea Discussed need to be re valuated for sleep apnea so that he is treated, chooses to defer , states unable to sleep in facility, would recommend in home study will message him re the option  TOBACCO ABUSE Asked:confirms currently smokes 15 cigarettes/ day Assess: Unwilling to quit but cutting back Advise: needs to QUIT to reduce risk of cancer, cardio and cerebrovascular disease Assist: counseled for 5 minutes and literature provided Arrange: follow up in 3 months

## 2019-08-13 NOTE — Assessment & Plan Note (Signed)
Uncontrolled , increase Pravachol dose and lower fat Hyperlipidemia:Low fat diet discussed and encouraged.   Lipid Panel  Lab Results  Component Value Date   CHOL 189 08/12/2019   HDL 42 08/12/2019   LDLCALC 115 (H) 08/12/2019   TRIG 203 (H) 08/12/2019   CHOLHDL 4.5 08/12/2019

## 2019-08-13 NOTE — Assessment & Plan Note (Signed)
Discussed need to be re valuated for sleep apnea so that he is treated, chooses to defer , states unable to sleep in facility, would recommend in home study will message him re the option

## 2019-08-13 NOTE — Assessment & Plan Note (Signed)
Asked:confirms currently smokes 15 cigarettes/ day Assess: Unwilling to quit but cutting back Advise: needs to QUIT to reduce risk of cancer, cardio and cerebrovascular disease Assist: counseled for 5 minutes and literature provided Arrange: follow up in 3 months

## 2019-08-13 NOTE — Assessment & Plan Note (Signed)
Controlled, no change in medication DASH diet and commitment to daily physical activity for a minimum of 30 minutes discussed and encouraged, as a part of hypertension management. The importance of attaining a healthy weight is also discussed.  BP/Weight 08/13/2019 07/18/2019 04/01/2019 11/06/2018 08/06/2018 05/30/2018 [id number]  Systolic BP 123456 [id number] 123456 0000000 [id number]  Diastolic BP 84 88 82 82 84 84 84  Wt. (Lbs) 291 288.12 297 297 295 296 296.75  BMI 39.47 39.08 40.28 40.28 40.01 40.14 40.25

## 2019-08-26 ENCOUNTER — Other Ambulatory Visit: Payer: Self-pay | Admitting: Family Medicine

## 2019-10-27 ENCOUNTER — Other Ambulatory Visit: Payer: Self-pay | Admitting: Family Medicine

## 2019-10-27 DIAGNOSIS — I10 Essential (primary) hypertension: Secondary | ICD-10-CM

## 2019-11-03 ENCOUNTER — Other Ambulatory Visit: Payer: Self-pay | Admitting: Family Medicine

## 2019-12-05 ENCOUNTER — Ambulatory Visit: Payer: Medicare HMO | Attending: Internal Medicine

## 2019-12-05 DIAGNOSIS — Z23 Encounter for immunization: Secondary | ICD-10-CM

## 2019-12-05 NOTE — Progress Notes (Signed)
   Covid-19 Vaccination Clinic  Name:  Dakota Mendez    MRN: [id number] DOB: 1960-10-14  12/05/2019  Dakota Mendez was observed post Covid-19 immunization for 15 minutes without incident. He was provided with Vaccine Information Sheet and instruction to access the V-Safe system.   Dakota Mendez was instructed to call 911 with any severe reactions post vaccine: Marland Kitchen Difficulty breathing  . Swelling of face and throat  . A fast heartbeat  . A bad rash all over body  . Dizziness and weakness   Immunizations Administered    Name Date Dose VIS Date Route   Moderna COVID-19 Vaccine 12/05/2019 11:46 AM 0.5 mL 08/12/2019 Intramuscular   Manufacturer: Moderna   Lot: [id number]   [id number]

## 2020-01-06 ENCOUNTER — Ambulatory Visit: Payer: Medicare HMO | Admitting: Family Medicine

## 2020-01-06 ENCOUNTER — Encounter: Payer: Self-pay | Admitting: Family Medicine

## 2020-01-06 ENCOUNTER — Ambulatory Visit: Payer: Medicare HMO | Attending: Internal Medicine

## 2020-01-06 DIAGNOSIS — Z23 Encounter for immunization: Secondary | ICD-10-CM

## 2020-01-06 NOTE — Progress Notes (Signed)
   Covid-19 Vaccination Clinic  Name:  Dakota Mendez    MRN: [id number] DOB: Oct 23, 1960  01/06/2020  [name] was observed post Covid-19 immunization for 15 minutes without incident. He was provided with Vaccine Information Sheet and instruction to access the V-Safe system.   [name] was instructed to call 911 with any severe reactions post vaccine: Marland Kitchen Difficulty breathing  . Swelling of face and throat  . A fast heartbeat  . A bad rash all over body  . Dizziness and weakness   Immunizations Administered    Name Date Dose VIS Date Route   Moderna COVID-19 Vaccine 01/06/2020 11:29 AM 0.5 mL 08/2019 Intramuscular   Manufacturer: Moderna   Lot: [id number]   [place]BE:[id number]

## 2020-01-12 ENCOUNTER — Other Ambulatory Visit: Payer: Self-pay | Admitting: Family Medicine

## 2020-01-12 ENCOUNTER — Other Ambulatory Visit: Payer: Self-pay

## 2020-01-12 DIAGNOSIS — Z122 Encounter for screening for malignant neoplasm of respiratory organs: Secondary | ICD-10-CM

## 2020-01-12 DIAGNOSIS — F17210 Nicotine dependence, cigarettes, uncomplicated: Secondary | ICD-10-CM

## 2020-01-12 NOTE — Telephone Encounter (Signed)
Do you want to excuse from jury duty?

## 2020-01-14 ENCOUNTER — Encounter (HOSPITAL_COMMUNITY): Payer: Self-pay | Admitting: *Deleted

## 2020-01-14 ENCOUNTER — Other Ambulatory Visit (HOSPITAL_COMMUNITY): Payer: Self-pay | Admitting: *Deleted

## 2020-01-14 DIAGNOSIS — Z87891 Personal history of nicotine dependence: Secondary | ICD-10-CM

## 2020-01-14 DIAGNOSIS — Z122 Encounter for screening for malignant neoplasm of respiratory organs: Secondary | ICD-10-CM

## 2020-01-14 NOTE — Progress Notes (Signed)
Orders for LDCT placed.   

## 2020-01-14 NOTE — Progress Notes (Signed)
Received referral for initial lung cancer screening scan.  Contacted patient and obtained smoking history (started age 59 smoking 1 ppd and still smokes 1ppd, current smoker, 42 pack year) as well as answering questions related to the screening process.  Patient denies signs/symptoms of lung cancer such as weight loss or hemoptysis.  Patient denies comorbidity that would prevent curative treatment if lung cancer were to be found.  Patient is scheduled for shared decision making visit and CT scan on 5/21 at 1030.

## 2020-01-14 NOTE — Progress Notes (Signed)
I received referral for lung cancer screening program. I attempted to contact patient today, he has voicemail but the mailbox is full.  I will attempt to call him again at a later date.

## 2020-01-21 ENCOUNTER — Other Ambulatory Visit: Payer: Self-pay | Admitting: Family Medicine

## 2020-01-30 ENCOUNTER — Other Ambulatory Visit: Payer: Self-pay

## 2020-01-30 ENCOUNTER — Ambulatory Visit (HOSPITAL_COMMUNITY)
Admission: RE | Admit: 2020-01-30 | Discharge: 2020-01-30 | Disposition: A | Discharge status: Home / Self Care | Payer: Medicare HMO | Source: Ambulatory Visit | Attending: Nurse Practitioner | Admitting: Nurse Practitioner

## 2020-01-30 ENCOUNTER — Inpatient Hospital Stay (HOSPITAL_COMMUNITY): Payer: Medicare HMO | Attending: Nurse Practitioner | Admitting: Nurse Practitioner

## 2020-01-30 DIAGNOSIS — Z87891 Personal history of nicotine dependence: Secondary | ICD-10-CM | POA: Diagnosis not present

## 2020-01-30 DIAGNOSIS — F17210 Nicotine dependence, cigarettes, uncomplicated: Secondary | ICD-10-CM | POA: Diagnosis not present

## 2020-01-30 DIAGNOSIS — Z122 Encounter for screening for malignant neoplasm of respiratory organs: Secondary | ICD-10-CM | POA: Insufficient documentation

## 2020-01-30 DIAGNOSIS — Z720 Tobacco use: Secondary | ICD-10-CM | POA: Insufficient documentation

## 2020-01-30 NOTE — Assessment & Plan Note (Signed)
1.Tobacco abuse: -This patient meets criteria for low-dose CT scan lung cancer screening. -He is asymptomatic for any signs and symptoms of lung cancer. -This shared decision making visit discussion includes risk and benefits of screening, potential for follow-up, diagnostic testing for abnormal scans, potential for false positive test, over diagnosis and discussion about the total radiation exposure. -Patient stated willingness to undergo diagnostics and treatment as needed. -Patient was counseled on smoking cessation to decrease risk of lung cancer, pulmonary disease, heart disease and stroke. -Patient was given resource card with information on receiving free nicotine replacement therapy and information about free smoking cessation classes. -Patient will present for LD CT scan today and follow-up with PCP.

## 2020-01-30 NOTE — Progress Notes (Signed)
[facility] NOTE  Patient Care Team: Fayrene Helper, MD as PCP - General  CHIEF COMPLAINTS/PURPOSE OF CONSULTATION: Shared decision making visit for lung cancer screening  HISTORY OF PRESENTING ILLNESS:  Dakota Mendez 59 y.o. male presents today for shared decision making visit for lung cancer screening.  He has a past medical history significant for diabetes, hyperlipidemia, hypertension, obesity and sleep apnea.  Patient states he started smoking at the age of 77.  He currently smokes 1 pack/day.  He reports occasional cough without sputum production.  He has shortness of breath with physical activity.  However this does resolve with rest.  He denies any alcohol or illicit drug use.  He has a family history significant for a grandfather with stomach cancer.  MEDICAL HISTORY:  Past Medical History:  Diagnosis Date  . Chronic back pain   . Chronic neck pain   . Diabetes mellitus   . Hyperlipidemia   . Hypertension   . Obesity   . Sleep apnea    states he does not wear his CPAP  . Tobacco abuse     SURGICAL HISTORY: Past Surgical History:  Procedure Laterality Date  . CERVICAL SPINE SURGERY    . COLONOSCOPY N/A 08/08/2017   Procedure: COLONOSCOPY;  Surgeon: Rogene Houston, MD;  Location: [facility];  Service: Endoscopy;  Laterality: N/A;  930  . SPINE SURGERY  09/21/2011   cervial spine    SOCIAL HISTORY: Social History   Socioeconomic History  . Marital status: Married    Spouse name: Not on file  . Number of children: 4  . Years of education: Not on file  . Highest education level: 10th grade  Occupational History  . Occupation: disabled-truck dirver  Tobacco Use  . Smoking status: Current Every Day Smoker    Packs/day: 0.50    Years: 40.00    Pack years: 20.00    Types: Cigarettes  . Smokeless tobacco: Never Used  . Tobacco comment: 1 PPD for 35 years  Substance and Sexual Activity  . Alcohol use: No  . Drug use: No  .  Sexual activity: Yes  Other Topics Concern  . Not on file  Social History Narrative  . Not on file   Social Determinants of Health   Financial Resource Strain:   . Difficulty of Paying Living Expenses:   Food Insecurity:   . Worried About Charity fundraiser in the Last Year:   . Arboriculturist in the Last Year:   Transportation Needs:   . Film/video editor (Medical):   Marland Kitchen Lack of Transportation (Non-Medical):   Physical Activity:   . Days of Exercise per Week:   . Minutes of Exercise per Session:   Stress:   . Feeling of Stress :   Social Connections:   . Frequency of Communication with Friends and Family:   . Frequency of Social Gatherings with Friends and Family:   . Attends Religious Services:   . Active Member of Clubs or Organizations:   . Attends Archivist Meetings:   Marland Kitchen Marital Status:   Intimate Partner Violence:   . Fear of Current or Ex-Partner:   . Emotionally Abused:   Marland Kitchen Physically Abused:   . Sexually Abused:     FAMILY HISTORY: Family History  Problem Relation Age of Onset  . Cirrhosis Father   . Heart attack Mother   . Depression Mother   . Stroke Mother   .  Hearing loss Brother   . Cirrhosis Brother   . Diabetes Sister   . Sleep apnea Son   . Hypertension Son     ALLERGIES:  has No Known Allergies.  MEDICATIONS:  Current Outpatient Medications  Medication Sig Dispense Refill  . amLODipine (NORVASC) 10 MG tablet TAKE 1 TABLET EVERY DAY 90 tablet 1  . cloNIDine (CATAPRES) 0.2 MG tablet TAKE 1 TABLET EVERY DAY 90 tablet 1  . glipiZIDE (GLUCOTROL XL) 5 MG 24 hr tablet TAKE 1 TABLET EVERY DAY WITH BREAKFAST  (DECREASE  IN  DOSE) 90 tablet 1  . glucose blood (ONE TOUCH ULTRA TEST) test strip Use as instructed for once daily testing  Dx 250.00 length of need- 99 months 100 each 11  . hydrochlorothiazide (MICROZIDE) 12.5 MG capsule TAKE 1 CAPSULE (12.5 MG TOTAL) BY MOUTH DAILY. 90 capsule 0  . hydrochlorothiazide (MICROZIDE) 12.5 MG  capsule Take 1 capsule (12.5 mg total) by mouth daily. 90 capsule 3  . ibuprofen (ADVIL) 800 MG tablet TAKE 1 TABLET TWICE DAILY AS NEEDED FOR PAIN 30 tablet 0  . losartan-hydrochlorothiazide (HYZAAR) 100-25 MG tablet TAKE 1 TABLET EVERY DAY 90 tablet 1  . metFORMIN (GLUCOPHAGE) 1000 MG tablet TAKE 1 TABLET TWICE DAILY WITH MEALS 180 tablet 1  . ONETOUCH DELICA LANCETS 99991111 MISC 1 each by Does not apply route daily. For once daily testing DX 250.00 Length of need- 99 months 100 each 11  . potassium chloride (KLOR-CON) 10 MEQ tablet TAKE 1 TABLET EVERY DAY 90 tablet 3  . pravastatin (PRAVACHOL) 80 MG tablet Take 1 tablet (80 mg total) by mouth daily. 90 tablet 3   No current facility-administered medications for this visit.    LABORATORY DATA:  I have reviewed the data as listed Lab Results  Component Value Date   WBC 12.2 (H) 08/12/2019   HGB 14.1 08/12/2019   HCT 42.1 08/12/2019   MCV 85.1 08/12/2019   PLT 370 08/12/2019     Chemistry      Component Value Date/Time   NA 139 08/12/2019 0736   NA 142 09/18/2011 0834   K 3.8 08/12/2019 0736   K 3.7 09/18/2011 0834   CL 101 08/12/2019 0736   CL 105 09/18/2011 0834   CO2 26 08/12/2019 0736   CO2 28 09/18/2011 0834   BUN 19 08/12/2019 0736   BUN 17 09/18/2011 0834   CREATININE 1.01 08/12/2019 0736      Component Value Date/Time   CALCIUM 9.4 08/12/2019 0736   CALCIUM 8.8 09/18/2011 0834   ALKPHOS 61 02/13/2017 0720   AST 10 08/12/2019 0736   ALT 10 08/12/2019 0736   BILITOT 0.8 08/12/2019 0736      ASSESSMENT & PLAN:  Tobacco abuse 1.Tobacco abuse: -This patient meets criteria for low-dose CT scan lung cancer screening. -He is asymptomatic for any signs and symptoms of lung cancer. -This shared decision making visit discussion includes risk and benefits of screening, potential for follow-up, diagnostic testing for abnormal scans, potential for false positive test, over diagnosis and discussion about the total radiation  exposure. -Patient stated willingness to undergo diagnostics and treatment as needed. -Patient was counseled on smoking cessation to decrease risk of lung cancer, pulmonary disease, heart disease and stroke. -Patient was given resource card with information on receiving free nicotine replacement therapy and information about free smoking cessation classes. -Patient will present for LD CT scan today and follow-up with PCP.   All questions were answered. The patient knows to  call the clinic with any problems, questions or concerns. I spent 10 minutes counseling the patient face to face. The total time spent in the appointment was 20 minutes and more than 50% was on counseling.     Glennie Isle, NP-C 01/30/2020 10:55 AM

## 2020-01-30 NOTE — Patient Instructions (Signed)
[facility] at [facility] Discharge Instructions     Thank you for choosing  Cancer Center at [facility] to provide your oncology and hematology care.  To afford each patient quality time with our provider, please arrive at least 15 minutes before your scheduled appointment time.   If you have a lab appointment with the Cancer Center please come in thru the Main Entrance and check in at the main information desk.  You need to re-schedule your appointment should you arrive 10 or more minutes late.  We strive to give you quality time with our providers, and arriving late affects you and other patients whose appointments are after yours.  Also, if you no show three or more times for appointments you may be dismissed from the clinic at the providers discretion.     Again, thank you for choosing [facility].  Our hope is that these requests will decrease the amount of time that you wait before being seen by our physicians.       _____________________________________________________________  Should you have questions after your visit to [facility], please contact our office at [phone] between the hours of 8:00 a.m. and 4:30 p.m.  Voicemails left after 4:00 p.m. will not be returned until the following business day.  For prescription refill requests, have your pharmacy contact our office and allow 72 hours.    Due to Covid, you will need to wear a mask upon entering the hospital. If you do not have a mask, a mask will be given to you at the Main Entrance upon arrival. For doctor visits, patients may have 1 support person with them. For treatment visits, patients can not have anyone with them due to social distancing guidelines and our immunocompromised population.      

## 2020-02-02 ENCOUNTER — Encounter (HOSPITAL_COMMUNITY): Payer: Self-pay | Admitting: *Deleted

## 2020-02-02 NOTE — Progress Notes (Signed)
Patient notified via telephone of LDCT lung cancer screening results with recommendations to follow up in 12 months.  Also notified of incidental findings of emphysema.  Patient's referring provider was sent a copy of results.    IMPRESSION: Lung-RADS 2, benign appearance or behavior. Continue annual screening with low-dose chest CT without contrast in 12 months.  Emphysema (ICD10-J43.9).

## 2020-03-15 ENCOUNTER — Other Ambulatory Visit: Payer: Self-pay | Admitting: Family Medicine

## 2020-03-15 DIAGNOSIS — I10 Essential (primary) hypertension: Secondary | ICD-10-CM

## 2020-03-29 ENCOUNTER — Ambulatory Visit: Payer: Medicare HMO

## 2020-04-01 ENCOUNTER — Telehealth (INDEPENDENT_AMBULATORY_CARE_PROVIDER_SITE_OTHER): Payer: Medicare HMO | Admitting: Family Medicine

## 2020-04-01 ENCOUNTER — Other Ambulatory Visit: Payer: Self-pay

## 2020-04-01 ENCOUNTER — Encounter: Payer: Self-pay | Admitting: Family Medicine

## 2020-04-01 VITALS — BP 120/84 | Ht 72.0 in | Wt 291.0 lb

## 2020-04-01 DIAGNOSIS — Z0000 Encounter for general adult medical examination without abnormal findings: Secondary | ICD-10-CM

## 2020-04-01 NOTE — Progress Notes (Signed)
Subjective:   Dakota Mendez is a 59 y.o. male who presents for Medicare Annual/Subsequent preventive examination.  Location of Patient: Home Location of Provider: Telehealth Consent was obtain for visit to be over via telephone  I verified that I am speaking with the correct person using two identifiers.  Review of Systems    yes       Objective:    Today's Vitals   04/01/20 1311  BP: 120/84  Weight: (!) 291 lb (132 kg)  Height: 6' (1.829 m)  PainSc: 0-No pain   Body mass index is 39.47 kg/m.  Advanced Directives 03/27/2018 08/08/2017 01/08/2017 08/24/2015 03/04/2015 02/17/2015  Does Patient Have a Medical Advance Directive? Yes No No No No No  Type of Paramedic of [place];Living will - - - - -  Copy of [place] in Chart? No - copy requested - - - - -  Would patient like information on creating a medical advance directive? - No - Patient declined Yes (MAU/Ambulatory/Procedural Areas - Information given) No - patient declined information No - patient declined information No - patient declined information    Current Medications (verified) Outpatient Encounter Medications as of 04/01/2020  Medication Sig  . amLODipine (NORVASC) 10 MG tablet TAKE 1 TABLET EVERY DAY  . cloNIDine (CATAPRES) 0.2 MG tablet TAKE 1 TABLET EVERY DAY  . glipiZIDE (GLUCOTROL XL) 5 MG 24 hr tablet TAKE 1 TABLET EVERY DAY WITH BREAKFAST  (DECREASE  IN  DOSE)  . glucose blood (ONE TOUCH ULTRA TEST) test strip Use as instructed for once daily testing  Dx 250.00 length of need- 99 months  . hydrochlorothiazide (MICROZIDE) 12.5 MG capsule TAKE 1 CAPSULE (12.5 MG TOTAL) BY MOUTH DAILY.  . hydrochlorothiazide (MICROZIDE) 12.5 MG capsule Take 1 capsule (12.5 mg total) by mouth daily.  Marland Kitchen ibuprofen (ADVIL) 800 MG tablet TAKE 1 TABLET TWICE DAILY AS NEEDED FOR PAIN  . losartan-hydrochlorothiazide (HYZAAR) 100-25 MG tablet TAKE 1 TABLET EVERY DAY  . metFORMIN  (GLUCOPHAGE) 1000 MG tablet TAKE 1 TABLET TWICE DAILY WITH MEALS  . ONETOUCH DELICA LANCETS 78G MISC 1 each by Does not apply route daily. For once daily testing DX 250.00 Length of need- 99 months  . potassium chloride (KLOR-CON) 10 MEQ tablet TAKE 1 TABLET EVERY DAY  . pravastatin (PRAVACHOL) 80 MG tablet Take 1 tablet (80 mg total) by mouth daily.   No facility-administered encounter medications on file as of 04/01/2020.    Allergies (verified) Patient has no known allergies.   History: Past Medical History:  Diagnosis Date  . Chronic back pain   . Chronic neck pain   . Diabetes mellitus   . Hyperlipidemia   . Hypertension   . Obesity   . Sleep apnea    states he does not wear his CPAP  . Tobacco abuse    Past Surgical History:  Procedure Laterality Date  . CERVICAL SPINE SURGERY    . COLONOSCOPY N/A 08/08/2017   Procedure: COLONOSCOPY;  Surgeon: Rogene Houston, MD;  Location: [facility];  Service: Endoscopy;  Laterality: N/A;  930  . SPINE SURGERY  09/21/2011   cervial spine   Family History  Problem Relation Age of Onset  . Cirrhosis Father   . Heart attack Mother   . Depression Mother   . Stroke Mother   . Hearing loss Brother   . Cirrhosis Brother   . Diabetes Sister   . Sleep apnea Son   .  Hypertension Son    Social History   Socioeconomic History  . Marital status: Married    Spouse name: Not on file  . Number of children: 4  . Years of education: Not on file  . Highest education level: 10th grade  Occupational History  . Occupation: disabled-truck dirver  Tobacco Use  . Smoking status: Current Every Day Smoker    Packs/day: 0.50    Years: 40.00    Pack years: 20.00    Types: Cigarettes  . Smokeless tobacco: Never Used  . Tobacco comment: 1 PPD for 35 years  Vaping Use  . Vaping Use: Never used  Substance and Sexual Activity  . Alcohol use: No  . Drug use: No  . Sexual activity: Yes  Other Topics Concern  . Not on file  Social History  Narrative  . Not on file   Social Determinants of Health   Financial Resource Strain: Low Risk   . Difficulty of Paying Living Expenses: Not hard at all  Food Insecurity: No Food Insecurity  . Worried About Charity fundraiser in the Last Year: Never true  . Ran Out of Food in the Last Year: Never true  Transportation Needs: No Transportation Needs  . Lack of Transportation (Medical): No  . Lack of Transportation (Non-Medical): No  Physical Activity: Insufficiently Active  . Days of Exercise per Week: 3 days  . Minutes of Exercise per Session: 30 min  Stress: No Stress Concern Present  . Feeling of Stress : Not at all  Social Connections: Moderately Integrated  . Frequency of Communication with Friends and Family: More than three times a week  . Frequency of Social Gatherings with Friends and Family: More than three times a week  . Attends Religious Services: 1 to 4 times per year  . Active Member of Clubs or Organizations: No  . Attends Archivist Meetings: Never  . Marital Status: Married    Tobacco Counseling Ready to quit: Yes Counseling given: Yes Comment: 1 PPD for 35 years   Clinical Intake:     Pain Score: 0-No pain           Diabetic?yes         Activities of Daily Living In your present state of health, do you have any difficulty performing the following activities: 04/01/2020  Hearing? N  Vision? N  Difficulty concentrating or making decisions? N  Walking or climbing stairs? N  Dressing or bathing? N  Doing errands, shopping? N  Some recent data might be hidden    Patient Care Team: Fayrene Helper, MD as PCP - General  Indicate any recent Medical Services you may have received from other than [facility] providers in the past year (date may be approximate).     Assessment:   This is a routine wellness examination for Dakota Mendez.  Hearing/Vision screen No exam data present  Dietary issues and exercise activities discussed: Current  Exercise Habits: Home exercise routine, Time (Minutes): 10, Frequency (Times/Week): 3, Weekly Exercise (Minutes/Week): 30, Intensity: Mild  Goals    . Exercise 3x per week (30 min per time)     Recommend starting a routine exercise program at least 3 days a week for 30-45 minutes at a time as tolerated.      . Quit Smoking     Would like to quit smoking       Depression Screen [facility] 2/9 Scores 04/01/2020 07/18/2019 04/01/2019 11/06/2018 08/06/2018 05/30/2018 03/27/2018  PHQ - 2 Score  0 0 0 0 0 0 2  PHQ- 9 Score 0 - - - - - 3    Fall Risk Fall Risk  04/01/2020 08/13/2019 07/18/2019 04/01/2019 11/06/2018  Falls in the past year? 1 0 0 1 0  Number falls in past yr: 0 0 0 0 0  Injury with Fall? 1 0 0 1 0  Risk for fall due to : History of fall(s);Impaired balance/gait - - - Impaired balance/gait;Impaired vision  Follow up Education provided;Falls evaluation completed;Falls prevention discussed - - - Falls evaluation completed    Any stairs in or around the home? No  If so, are there any without handrails? No  Home free of loose throw rugs in walkways, pet beds, electrical cords, etc? Yes  Adequate lighting in your home to reduce risk of falls? Yes   ASSISTIVE DEVICES UTILIZED TO PREVENT FALLS:  Life alert? No  Use of a cane, walker or w/c? No  Grab bars in the bathroom? No  Shower chair or bench in shower? No  Elevated toilet seat or a handicapped toilet? No   TIMED UP AND GO:  Was the test performed? No .  Length of time to ambulate 10 feet: NA  sec.     Cognitive Function:     6CIT Screen 04/01/2020 04/01/2019 03/27/2018 01/08/2017  What Year? 0 points 0 points 0 points 0 points  What month? 0 points 0 points 0 points 0 points  What time? 0 points 0 points 0 points 0 points  Count back from 20 0 points 0 points 0 points 0 points  Months in reverse 0 points 0 points 0 points 0 points  Repeat phrase 0 points 0 points 0 points 0 points  Total Score 0 0 0 0     Immunizations Immunization History  Administered Date(s) Administered  . Influenza Whole 10/11/2007, 05/17/2011  . Influenza,inj,Quad PF,6+ Mos 06/13/2013, 06/18/2014, 07/22/2015, 06/01/2016, 07/25/2017, 05/30/2018, 06/09/2019  . Moderna SARS-COVID-2 Vaccination 12/05/2019, 01/06/2020  . Pneumococcal Conjugate-13 12/15/2014  . Pneumococcal Polysaccharide-23 04/05/2011, 06/01/2016  . Td 09/21/2006    TDAP status: Up to date Flu Vaccine status: Up to date Pneumococcal vaccine status: Up to date Covid-19 vaccine status: Completed vaccines  Qualifies for Shingles Vaccine? Yes   Zostavax completed Yes   Shingrix Completed?: Yes  Screening Tests Health Maintenance  Topic Date Due  . OPHTHALMOLOGY EXAM  03/13/2018  . FOOT EXAM  11/08/2019  . HEMOGLOBIN A1C  02/10/2020  . TETANUS/TDAP  08/12/2020 (Originally 09/21/2016)  . INFLUENZA VACCINE  04/11/2020  . COLONOSCOPY  08/09/2027  . PNEUMOCOCCAL POLYSACCHARIDE VACCINE AGE 24-64 HIGH RISK  Completed  . COVID-19 Vaccine  Completed  . Hepatitis C Screening  Completed  . HIV Screening  Completed    Health Maintenance  Health Maintenance Due  Topic Date Due  . OPHTHALMOLOGY EXAM  03/13/2018  . FOOT EXAM  11/08/2019  . HEMOGLOBIN A1C  02/10/2020    Colorectal cancer screening: Completed 08-08-17. Repeat every 10 years  Lung Cancer Screening: (Low Dose CT Chest recommended if Age 86-80 years, 30 pack-year currently smoking OR have quit w/in 15years.) does qualify.   Lung Cancer Screening Referral: no had this done 3 months ago  Additional Screening:  Hepatitis C Screening: does not qualify;  Vision Screening: Recommended annual ophthalmology exams for early detection of glaucoma and other disorders of the eye. Is the patient up to date with their annual eye exam?  No  Who is the provider or what is the name  of the office in which the patient attends annual eye exams? [facility]  If pt is not established with a provider,  would they like to be referred to a provider to establish care? No .   Dental Screening: Recommended annual dental exams for proper oral hygiene  Community Resource Referral / Chronic Care Management: CRR required this visit?  No   CCM required this visit?  No      Plan:     1. Encounter for Medicare annual wellness exam   I have personally reviewed and noted the following in the patient's chart:   . Medical and social history . Use of alcohol, tobacco or illicit drugs  . Current medications and supplements . Functional ability and status . Nutritional status . Physical activity . Advanced directives . List of other physicians . Hospitalizations, surgeries, and ER visits in previous 12 months . Vitals . Screenings to include cognitive, depression, and falls . Referrals and appointments  In addition, I have reviewed and discussed with patient certain preventive protocols, quality metrics, and best practice recommendations. A written personalized care plan for preventive services as well as general preventive health recommendations were provided to patient.     Shelda Altes, CMA   04/01/2020   I provided 25 minutes of non-face-to-face time during this encounter.

## 2020-04-01 NOTE — Patient Instructions (Addendum)
Dakota Mendez , Thank you for taking time to come for your Medicare Wellness Visit. I appreciate your ongoing commitment to your health goals. Please review the following plan we discussed and let me know if I can assist you in the future.   Screening recommendations/referrals: Colonoscopy: up to date Recommended yearly ophthalmology/optometry visit for glaucoma screening and checkup Recommended yearly dental visit for hygiene and checkup  Vaccinations: Influenza vaccine: Due in Fall of 2021 Pneumococcal vaccine: Up to date Tdap vaccine: Due  Shingles vaccine: completed  Advanced directives: Declined   Conditions/risks identified: Falls   Next appointment: 04/05/2020 retina exam; 8/30 in office with Dr Moshe Cipro   Preventive Care 40-64 Years, Male Preventive care refers to lifestyle choices and visits with your health care provider that can promote health and wellness. What does preventive care include?  A yearly physical exam. This is also called an annual well check.  Dental exams once or twice a year.  Routine eye exams. Ask your health care provider how often you should have your eyes checked.  Personal lifestyle choices, including:  Daily care of your teeth and gums.  Regular physical activity.  Eating a healthy diet.  Avoiding tobacco and drug use.  Limiting alcohol use.  Practicing safe sex.  Taking low-dose aspirin every day starting at age 58. What happens during an annual well check? The services and screenings done by your health care provider during your annual well check will depend on your age, overall health, lifestyle risk factors, and family history of disease. Counseling  Your health care provider may ask you questions about your:  Alcohol use.  Tobacco use.  Drug use.  Emotional well-being.  Home and relationship well-being.  Sexual activity.  Eating habits.  Work and work Statistician. Screening  You may have the following tests or  measurements:  Height, weight, and BMI.  Blood pressure.  Lipid and cholesterol levels. These may be checked every 5 years, or more frequently if you are over 41 years old.  Skin check.  Lung cancer screening. You may have this screening every year starting at age 26 if you have a 30-pack-year history of smoking and currently smoke or have quit within the past 15 years.  Fecal occult blood test (FOBT) of the stool. You may have this test every year starting at age 65.  Flexible sigmoidoscopy or colonoscopy. You may have a sigmoidoscopy every 5 years or a colonoscopy every 10 years starting at age 59.  Prostate cancer screening. Recommendations will vary depending on your family history and other risks.  Hepatitis C blood test.  Hepatitis B blood test.  Sexually transmitted disease (STD) testing.  Diabetes screening. This is done by checking your blood sugar (glucose) after you have not eaten for a while (fasting). You may have this done every 1-3 years. Discuss your test results, treatment options, and if necessary, the need for more tests with your health care provider. Vaccines  Your health care provider may recommend certain vaccines, such as:  Influenza vaccine. This is recommended every year.  Tetanus, diphtheria, and acellular pertussis (Tdap, Td) vaccine. You may need a Td booster every 10 years.  Zoster vaccine. You may need this after age 42.  Pneumococcal 13-valent conjugate (PCV13) vaccine. You may need this if you have certain conditions and have not been vaccinated.  Pneumococcal polysaccharide (PPSV23) vaccine. You may need one or two doses if you smoke cigarettes or if you have certain conditions. Talk to your health care provider about  which screenings and vaccines you need and how often you need them. This information is not intended to replace advice given to you by your health care provider. Make sure you discuss any questions you have with your health care  provider. Document Released: 09/24/2015 Document Revised: 05/17/2016 Document Reviewed: 06/29/2015 Elsevier Interactive Patient Education  2017 [place] Prevention in the Home Falls can cause injuries. They can happen to people of all ages. There are many things you can do to make your home safe and to help prevent falls. What can I do on the outside of my home?  Regularly fix the edges of walkways and driveways and fix any cracks.  Remove anything that might make you trip as you walk through a door, such as a raised step or threshold.  Trim any bushes or trees on the path to your home.  Use bright outdoor lighting.  Clear any walking paths of anything that might make someone trip, such as rocks or tools.  Regularly check to see if handrails are loose or broken. Make sure that both sides of any steps have handrails.  Any raised decks and porches should have guardrails on the edges.  Have any leaves, snow, or ice cleared regularly.  Use sand or salt on walking paths during winter.  Clean up any spills in your garage right away. This includes oil or grease spills. What can I do in the bathroom?  Use night lights.  Install grab bars by the toilet and in the tub and shower. Do not use towel bars as grab bars.  Use non-skid mats or decals in the tub or shower.  If you need to sit down in the shower, use a plastic, non-slip stool.  Keep the floor dry. Clean up any water that spills on the floor as soon as it happens.  Remove soap buildup in the tub or shower regularly.  Attach bath mats securely with double-sided non-slip rug tape.  Do not have throw rugs and other things on the floor that can make you trip. What can I do in the bedroom?  Use night lights.  Make sure that you have a light by your bed that is easy to reach.  Do not use any sheets or blankets that are too big for your bed. They should not hang down onto the floor.  Have a firm chair that has  side arms. You can use this for support while you get dressed.  Do not have throw rugs and other things on the floor that can make you trip. What can I do in the kitchen?  Clean up any spills right away.  Avoid walking on wet floors.  Keep items that you use a lot in easy-to-reach places.  If you need to reach something above you, use a strong step stool that has a grab bar.  Keep electrical cords out of the way.  Do not use floor polish or wax that makes floors slippery. If you must use wax, use non-skid floor wax.  Do not have throw rugs and other things on the floor that can make you trip. What can I do with my stairs?  Do not leave any items on the stairs.  Make sure that there are handrails on both sides of the stairs and use them. Fix handrails that are broken or loose. Make sure that handrails are as long as the stairways.  Check any carpeting to make sure that it is firmly attached to the  stairs. Fix any carpet that is loose or worn.  Avoid having throw rugs at the top or bottom of the stairs. If you do have throw rugs, attach them to the floor with carpet tape.  Make sure that you have a light switch at the top of the stairs and the bottom of the stairs. If you do not have them, ask someone to add them for you. What else can I do to help prevent falls?  Wear shoes that:  Do not have high heels.  Have rubber bottoms.  Are comfortable and fit you well.  Are closed at the toe. Do not wear sandals.  If you use a stepladder:  Make sure that it is fully opened. Do not climb a closed stepladder.  Make sure that both sides of the stepladder are locked into place.  Ask someone to hold it for you, if possible.  Clearly mark and make sure that you can see:  Any grab bars or handrails.  First and last steps.  Where the edge of each step is.  Use tools that help you move around (mobility aids) if they are needed. These  include:  Canes.  Walkers.  Scooters.  Crutches.  Turn on the lights when you go into a dark area. Replace any light bulbs as soon as they burn out.  Set up your furniture so you have a clear path. Avoid moving your furniture around.  If any of your floors are uneven, fix them.  If there are any pets around you, be aware of where they are.  Review your medicines with your doctor. Some medicines can make you feel dizzy. This can increase your chance of falling. Ask your doctor what other things that you can do to help prevent falls. This information is not intended to replace advice given to you by your health care provider. Make sure you discuss any questions you have with your health care provider. Document Released: 06/24/2009 Document Revised: 02/03/2016 Document Reviewed: 10/02/2014 Elsevier Interactive Patient Education  2017 [employer].

## 2020-04-05 ENCOUNTER — Ambulatory Visit: Payer: Medicare HMO

## 2020-04-05 ENCOUNTER — Other Ambulatory Visit: Payer: Self-pay

## 2020-04-05 LAB — HM DIABETES EYE EXAM

## 2020-04-06 ENCOUNTER — Other Ambulatory Visit: Payer: Self-pay

## 2020-04-06 ENCOUNTER — Telehealth: Payer: Self-pay | Admitting: Family Medicine

## 2020-04-06 DIAGNOSIS — E785 Hyperlipidemia, unspecified: Secondary | ICD-10-CM

## 2020-04-06 DIAGNOSIS — I10 Essential (primary) hypertension: Secondary | ICD-10-CM

## 2020-04-06 DIAGNOSIS — E1159 Type 2 diabetes mellitus with other circulatory complications: Secondary | ICD-10-CM

## 2020-04-06 NOTE — Telephone Encounter (Signed)
Patient aware of labs to be drawn and have them drawn at [place]

## 2020-04-06 NOTE — Telephone Encounter (Signed)
Patient wants to know if he needs to get labs done before his appt on 05/10/20. Would like someone to return his call [phone]

## 2020-04-28 DIAGNOSIS — E1159 Type 2 diabetes mellitus with other circulatory complications: Secondary | ICD-10-CM | POA: Diagnosis not present

## 2020-04-28 DIAGNOSIS — I10 Essential (primary) hypertension: Secondary | ICD-10-CM | POA: Diagnosis not present

## 2020-04-28 DIAGNOSIS — E785 Hyperlipidemia, unspecified: Secondary | ICD-10-CM | POA: Diagnosis not present

## 2020-04-29 ENCOUNTER — Ambulatory Visit (INDEPENDENT_AMBULATORY_CARE_PROVIDER_SITE_OTHER): Payer: Medicare HMO | Admitting: Family Medicine

## 2020-04-29 ENCOUNTER — Other Ambulatory Visit: Payer: Self-pay

## 2020-04-29 ENCOUNTER — Encounter: Payer: Self-pay | Admitting: Family Medicine

## 2020-04-29 VITALS — BP 130/80 | HR 98 | Resp 16 | Ht 72.0 in | Wt 296.0 lb

## 2020-04-29 DIAGNOSIS — Z0000 Encounter for general adult medical examination without abnormal findings: Secondary | ICD-10-CM

## 2020-04-29 DIAGNOSIS — Z0001 Encounter for general adult medical examination with abnormal findings: Secondary | ICD-10-CM | POA: Diagnosis not present

## 2020-04-29 DIAGNOSIS — Z6841 Body Mass Index (BMI) 40.0 and over, adult: Secondary | ICD-10-CM | POA: Diagnosis not present

## 2020-04-29 DIAGNOSIS — F17200 Nicotine dependence, unspecified, uncomplicated: Secondary | ICD-10-CM

## 2020-04-29 DIAGNOSIS — Z23 Encounter for immunization: Secondary | ICD-10-CM

## 2020-04-29 DIAGNOSIS — Z720 Tobacco use: Secondary | ICD-10-CM

## 2020-04-29 DIAGNOSIS — E1159 Type 2 diabetes mellitus with other circulatory complications: Secondary | ICD-10-CM | POA: Diagnosis not present

## 2020-04-29 DIAGNOSIS — Z125 Encounter for screening for malignant neoplasm of prostate: Secondary | ICD-10-CM

## 2020-04-29 LAB — CMP14+EGFR
ALT: 13 IU/L (ref 0–44)
AST: 10 IU/L (ref 0–40)
Albumin/Globulin Ratio: 1.5 (ref 1.2–2.2)
Albumin: 4.1 g/dL (ref 3.8–4.9)
Alkaline Phosphatase: 80 IU/L (ref 48–121)
BUN/Creatinine Ratio: 16 (ref 9–20)
BUN: 15 mg/dL (ref 6–24)
Bilirubin Total: 0.6 mg/dL (ref 0.0–1.2)
CO2: 23 mmol/L (ref 20–29)
Calcium: 9.6 mg/dL (ref 8.7–10.2)
Chloride: 99 mmol/L (ref 96–106)
Creatinine, Ser: 0.91 mg/dL (ref 0.76–1.27)
GFR calc Af Amer: 108 mL/min/{1.73_m2} (ref >59)
GFR calc non Af Amer: 93 mL/min/{1.73_m2} (ref >59)
Globulin, Total: 2.8 g/dL (ref 1.5–4.5)
Glucose: 210 mg/dL — ABNORMAL HIGH (ref 65–99)
Potassium: 4.1 mmol/L (ref 3.5–5.2)
Sodium: 139 mmol/L (ref 134–144)
Total Protein: 6.9 g/dL (ref 6.0–8.5)

## 2020-04-29 LAB — LIPID PANEL
Chol/HDL Ratio: 3.8 ratio (ref 0.0–5.0)
Cholesterol, Total: 161 mg/dL (ref 100–199)
HDL: 42 mg/dL (ref >39)
LDL Chol Calc (NIH): 89 mg/dL (ref 0–99)
Triglycerides: 177 mg/dL — ABNORMAL HIGH (ref 0–149)
VLDL Cholesterol Cal: 30 mg/dL (ref 5–40)

## 2020-04-29 LAB — HEMOGLOBIN A1C
Est. average glucose Bld gHb Est-mCnc: 203 mg/dL
Hgb A1c MFr Bld: 8.7 % — ABNORMAL HIGH (ref 4.8–5.6)

## 2020-04-29 MED ORDER — GLIPIZIDE ER 10 MG PO TB24: 10.0000 mg | ORAL_TABLET | Freq: Every day | ORAL | 1 refills | Status: DC

## 2020-04-29 NOTE — Assessment & Plan Note (Signed)

## 2020-04-29 NOTE — Assessment & Plan Note (Signed)
Deteriorated , inc glipizide to 10 mg daily Dakota Mendez is reminded of the importance of commitment to daily physical activity for 30 minutes or more, as able and the need to limit carbohydrate intake to 30 to 60 grams per meal to help with blood sugar control.   The need to take medication as prescribed, test blood sugar as directed, and to call between visits if there is a concern that blood sugar is uncontrolled is also discussed.   Dakota Mendez is reminded of the importance of daily foot exam, annual eye examination, and good blood sugar, blood pressure and cholesterol control.  Diabetic Labs Latest Ref Rng & Units 04/28/2020 08/12/2019 11/05/2018 05/31/2018 11/26/2017  HbA1c 4.8 - 5.6 % 8.7(H) 6.9(H) 7.3(H) 7.4(H) 7.2(H)  Microalbumin Not Estab. ug/mL - - - - -  Micro/Creat Ratio 0.0 - 30.0 mg/g creat - - - - -  Chol 100 - 199 mg/dL 161 189 191 179 175  HDL >39 mg/dL 42 42 43 38(L) 40(L)  Calc LDL 0 - 99 mg/dL 89 115(H) 115(H) 100(H) 109(H)  Triglycerides 0 - 149 mg/dL 177(H) 203(H) 209(H) 288(H) 150(H)  Creatinine 0.76 - 1.27 mg/dL 0.91 1.01 1.22 1.13 0.98   BP/Weight 04/29/2020 04/01/2020 08/13/2019 07/18/2019 04/01/2019 11/06/2018 76/19/5093  Systolic BP 267 124 580 998 338 250 539  Diastolic BP 80 84 84 88 82 82 84  Wt. (Lbs) 296 291 291 288.12 297 297 295  BMI 40.14 39.47 39.47 39.08 40.28 40.28 40.01   Foot/eye exam completion dates Latest Ref Rng & Units 04/29/2020 11/06/2018  Eye Exam No Retinopathy - -  Foot exam Order - - -  Foot Form Completion - Done Done

## 2020-04-29 NOTE — Progress Notes (Signed)
Dakota Mendez     MRN: [id number]      DOB: April 09, 1961   HPI: Patient is in for annual physical exam. Uncontrolled diabetes and smoking cessation are addressed. Recent labs, are reviewed. Immunization is reviewed , and  updated     PE; BP 130/80   Pulse 98   Resp 16   Ht 6' (1.829 m)   Wt 296 lb (134.3 kg)   SpO2 96%   BMI 40.14 kg/m   Pleasant male, alert and oriented x 3, in no cardio-pulmonary distress. Afebrile. HEENT No facial trauma or asymetry. Sinuses non tender. EOMI External ears normal,  Neck: supple, no adenopathy,JVD or thyromegaly.No bruits.  Chest: Clear to ascultation bilaterally.No crackles or wheezes. Non tender to palpation  Cardiovascular system; Heart sounds normal,  S1 and  S2 ,no S3.  No murmur, or thrill. Apical beat not displaced Peripheral pulses normal.  Abdomen: Soft, non tender, no organomegaly or masses. No bruits. Bowel sounds normal. No guarding, tenderness or rebound.    Musculoskeletal exam: Full ROM of spine, hips , shoulders and knees. No deformity ,swelling or crepitus noted. No muscle wasting or atrophy.   Neurologic: Cranial nerves 2 to 12 intact. Power, tone ,sensation and reflexes normal throughout. No disturbance in gait. No tremor.  Skin: Intact, no ulceration, erythema , scaling or rash noted.onychomycosis Pigmentation normal throughout  Psych; Normal mood and affect. Judgement and concentration normal   Assessment & Plan:  Physical exam, annual Annual exam as documented. Counseling done  re healthy lifestyle involving commitment to 150 minutes exercise per week, heart healthy diet, and attaining healthy weight.The importance of adequate sleep also discussed. Regular seat belt use and home safety, is also discussed. Changes in health habits are decided on by the patient with goals and time frames  set for achieving them. Immunization and cancer screening needs are specifically addressed at this  visit.   TOBACCO ABUSE Asked:confirms currently smokes cigarettes 10/day Assess: Unwilling to set a quit date, but is cutting back Advise: needs to QUIT to reduce risk of cancer, cardio and cerebrovascular disease Assist: counseled for 5 minutes and literature provided Arrange: follow up in 2 to 4 months   Type 2 diabetes mellitus with vascular disease ([place]) Deteriorated , inc glipizide to 10 mg daily Dakota Mendez is reminded of the importance of commitment to daily physical activity for 30 minutes or more, as able and the need to limit carbohydrate intake to 30 to 60 grams per meal to help with blood sugar control.   The need to take medication as prescribed, test blood sugar as directed, and to call between visits if there is a concern that blood sugar is uncontrolled is also discussed.   Dakota Mendez is reminded of the importance of daily foot exam, annual eye examination, and good blood sugar, blood pressure and cholesterol control.  Diabetic Labs Latest Ref Rng & Units 04/28/2020 08/12/2019 11/05/2018 05/31/2018 11/26/2017  HbA1c 4.8 - 5.6 % 8.7(H) 6.9(H) 7.3(H) 7.4(H) 7.2(H)  Microalbumin Not Estab. ug/mL - - - - -  Micro/Creat Ratio 0.0 - 30.0 mg/g creat - - - - -  Chol 100 - 199 mg/dL 161 189 191 179 175  HDL >39 mg/dL 42 42 43 38(L) 40(L)  Calc LDL 0 - 99 mg/dL 89 115(H) 115(H) 100(H) 109(H)  Triglycerides 0 - 149 mg/dL 177(H) 203(H) 209(H) 288(H) 150(H)  Creatinine 0.76 - 1.27 mg/dL 0.91 1.01 1.22 1.13 0.98   BP/Weight 04/29/2020 04/01/2020 08/13/2019 07/18/2019 04/01/2019  11/06/2018 20/72/1828  Systolic BP 833 744 514 604 799 872 158  Diastolic BP 80 84 84 88 82 82 84  Wt. (Lbs) 296 291 291 288.12 297 297 295  BMI 40.14 39.47 39.47 39.08 40.28 40.28 40.01   Foot/eye exam completion dates Latest Ref Rng & Units 04/29/2020 11/06/2018  Eye Exam No Retinopathy - -  Foot exam Order - - -  Foot Form Completion - Done Done        Morbid obesity with body mass index of 40.0-44.9 in  adult  Patient re-educated about  the importance of commitment to a  minimum of 150 minutes of exercise per week as able.  The importance of healthy food choices with portion control discussed, as well as eating regularly and within a 12 hour window most days. The need to choose "clean , green" food 50 to 75% of the time is discussed, as well as to make water the primary drink and set a goal of 64 ounces water daily.    Weight /BMI 04/29/2020 04/01/2020 08/13/2019  WEIGHT 296 lb 291 lb 291 lb  HEIGHT 6\' 0"  6\' 0"  6\' 0"   BMI 40.14 kg/m2 39.47 kg/m2 39.47 kg/m2

## 2020-04-29 NOTE — Assessment & Plan Note (Signed)
Asked:confirms currently smokes cigarettes 10/day Assess: Unwilling to set a quit date, but is cutting back Advise: needs to QUIT to reduce risk of cancer, cardio and cerebrovascular disease Assist: counseled for 5 minutes and literature provided Arrange: follow up in 2 to 4 months  

## 2020-04-29 NOTE — Patient Instructions (Addendum)
Follow-up in office with MD in second week in December call if you need me sooner.  Flu vaccine today. psa , tsh and chem 7 and EGFron fast, cBC and vit D and microalb  Dec 2 or after  Your blood sugar  is no longer controlled you need to stop cookies ice cream and eating what ever you want and follow diabetic diet.  Increase in dose of glipizide to 10 mg once daily.  You may take 2 glipizide 5 mg tablets together every day until you get your 10 mg dose.  You should have a apodiatrist cut your toenails as they are very thick  Please recommit to starting daily exercise walking for about 30 minutes each day.  You are referred for diabetic eye exam.  Thanks for choosing  Primary Care, we consider it a privelige to serve you.

## 2020-05-01 ENCOUNTER — Encounter: Payer: Self-pay | Admitting: Family Medicine

## 2020-05-01 NOTE — Assessment & Plan Note (Signed)
  Patient re-educated about  the importance of commitment to a  minimum of 150 minutes of exercise per week as able.  The importance of healthy food choices with portion control discussed, as well as eating regularly and within a 12 hour window most days. The need to choose "clean , green" food 50 to 75% of the time is discussed, as well as to make water the primary drink and set a goal of 64 ounces water daily.    Weight /BMI 04/29/2020 04/01/2020 08/13/2019  WEIGHT 296 lb 291 lb 291 lb  HEIGHT 6\' 0"  6\' 0"  6\' 0"   BMI 40.14 kg/m2 39.47 kg/m2 39.47 kg/m2

## 2020-05-10 ENCOUNTER — Ambulatory Visit: Payer: Medicare HMO | Admitting: Family Medicine

## 2020-05-12 ENCOUNTER — Other Ambulatory Visit: Payer: Self-pay | Admitting: Family Medicine

## 2020-06-01 ENCOUNTER — Other Ambulatory Visit: Payer: Self-pay | Admitting: Family Medicine

## 2020-06-07 ENCOUNTER — Other Ambulatory Visit: Payer: Self-pay | Admitting: Family Medicine

## 2020-06-24 ENCOUNTER — Other Ambulatory Visit: Payer: Self-pay

## 2020-06-24 MED ORDER — PRAVASTATIN SODIUM 80 MG PO TABS
80.0000 mg | ORAL_TABLET | Freq: Every day | ORAL | 3 refills | Status: DC
Start: 2020-06-24 — End: 2020-07-01

## 2020-06-24 MED ORDER — METFORMIN HCL 1000 MG PO TABS
1000.0000 mg | ORAL_TABLET | Freq: Two times a day (BID) | ORAL | 1 refills | Status: DC
Start: 2020-06-24 — End: 2020-07-01

## 2020-07-01 ENCOUNTER — Telehealth: Payer: Self-pay

## 2020-07-01 MED ORDER — METFORMIN HCL 1000 MG PO TABS
1000.0000 mg | ORAL_TABLET | Freq: Two times a day (BID) | ORAL | 1 refills | Status: DC
Start: 2020-07-01 — End: 2020-11-23

## 2020-07-01 MED ORDER — PRAVASTATIN SODIUM 80 MG PO TABS
80.0000 mg | ORAL_TABLET | Freq: Every day | ORAL | 1 refills | Status: DC
Start: 2020-07-01 — End: 2021-01-13

## 2020-07-01 MED ORDER — IBUPROFEN 800 MG PO TABS: ORAL_TABLET | ORAL | 0 refills | Status: DC

## 2020-07-01 NOTE — Telephone Encounter (Signed)
Patient lvm that he needed refills. Spoke with patient. Refills sent in to requested pharmacy

## 2020-07-22 ENCOUNTER — Other Ambulatory Visit: Payer: Self-pay

## 2020-07-22 DIAGNOSIS — E1159 Type 2 diabetes mellitus with other circulatory complications: Secondary | ICD-10-CM

## 2020-07-22 MED ORDER — ALCOHOL SWABS PADS: 1.0000 | MEDICATED_PAD | 0 refills | Status: AC

## 2020-07-22 MED ORDER — BLOOD GLUCOSE METER KIT: PACK | 0 refills | Status: DC

## 2020-07-27 ENCOUNTER — Other Ambulatory Visit: Payer: Self-pay

## 2020-07-27 MED ORDER — TRUEPLUS LANCETS 33G MISC: 1.0000 | 1 refills | Status: AC

## 2020-07-31 ENCOUNTER — Other Ambulatory Visit: Payer: Self-pay | Admitting: Family Medicine

## 2020-07-31 DIAGNOSIS — I10 Essential (primary) hypertension: Secondary | ICD-10-CM

## 2020-08-23 DIAGNOSIS — E559 Vitamin D deficiency, unspecified: Secondary | ICD-10-CM | POA: Diagnosis not present

## 2020-08-23 DIAGNOSIS — Z125 Encounter for screening for malignant neoplasm of prostate: Secondary | ICD-10-CM | POA: Diagnosis not present

## 2020-08-23 DIAGNOSIS — E1159 Type 2 diabetes mellitus with other circulatory complications: Secondary | ICD-10-CM | POA: Diagnosis not present

## 2020-08-24 ENCOUNTER — Encounter: Payer: Self-pay | Admitting: Family Medicine

## 2020-08-24 ENCOUNTER — Ambulatory Visit (INDEPENDENT_AMBULATORY_CARE_PROVIDER_SITE_OTHER): Payer: Medicare HMO | Admitting: Family Medicine

## 2020-08-24 ENCOUNTER — Other Ambulatory Visit: Payer: Self-pay

## 2020-08-24 VITALS — BP 142/88 | HR 107 | Resp 16 | Ht 72.0 in | Wt 288.0 lb

## 2020-08-24 DIAGNOSIS — F17210 Nicotine dependence, cigarettes, uncomplicated: Secondary | ICD-10-CM | POA: Diagnosis not present

## 2020-08-24 DIAGNOSIS — I10 Essential (primary) hypertension: Secondary | ICD-10-CM | POA: Diagnosis not present

## 2020-08-24 DIAGNOSIS — E785 Hyperlipidemia, unspecified: Secondary | ICD-10-CM | POA: Diagnosis not present

## 2020-08-24 DIAGNOSIS — E1159 Type 2 diabetes mellitus with other circulatory complications: Secondary | ICD-10-CM

## 2020-08-24 DIAGNOSIS — F17200 Nicotine dependence, unspecified, uncomplicated: Secondary | ICD-10-CM

## 2020-08-24 LAB — POCT GLYCOSYLATED HEMOGLOBIN (HGB A1C): HbA1c, POC (controlled diabetic range): 6.8 % (ref 0.0–7.0)

## 2020-08-24 MED ORDER — CLONIDINE HCL 0.3 MG PO TABS: 0.3000 mg | ORAL_TABLET | Freq: Every day | ORAL | 1 refills | Status: DC

## 2020-08-24 NOTE — Patient Instructions (Signed)
Follow-up with MD to reevaluate blood pressure first week in February call if you need me sooner.  Please always bring your  Medications to your visit as you have done today, thanks!  New for blood pressure is clonidine 0.3 mg 1 at bedtime..  Please stop clonidine 0.2 mg that you have as soon as you get the new higher dose.  Stop HCTZ 12.5 mg  Congratulations on excellent blood sugar with change in eating.  Start aspirin 81 mg coated 1 daily.  Add vitamin D3 1000 IU 1 daily.   Fasting lipids CMP and EGFR 5 days before follow-up.  Currently smoking 14 cigarettes daily.  Start at 12 cigarettes daily next week the week of Christmas and try to cut down by 1 cigarette every 1 to 2 weeks.  Quitting is the goal.  It is important that you exercise regularly at least 30 minutes 5 times a week. If you develop chest pain, have severe difficulty breathing, or feel very tired, stop exercising immediately and seek medical attention  Thanks for choosing [facility], we consider it a privelige to serve you. Best for 2022!

## 2020-08-24 NOTE — Progress Notes (Signed)
ATTILIO ZEITLER     MRN: [id number]      DOB: [birth year]   HPI Mr. Denk is here for follow up and re-evaluation of chronic medical conditions, medication management and review of any available recent lab and radiology data.  Preventive health is updated, specifically  Cancer screening and Immunization.   Has changed food choice stopped bread and sweets with weight loss and better overall feeling Denies polyuria, polydipsia, blurred vision , or hypoglycemic episodes.   Committing to daily exercise and change in food choice ROS Denies recent fever or chills. Denies sinus pressure, nasal congestion, ear pain or sore throat. Denies chest congestion, productive cough or wheezing. Denies chest pains, palpitations and leg swelling Denies abdominal pain, nausea, vomiting,diarrhea or constipation.   Denies dysuria, frequency, hesitancy or incontinence. Denies uncontrolled  joint pain, swelling and limitation in mobility. Denies headaches, seizures, numbness, or tingling. Denies depression, anxiety or insomnia. Denies skin break down or rash.   PE  BP (!) 142/88   Pulse (!) 107   Resp 16   Ht 6' (1.829 m)   Wt 288 lb (130.6 kg)   SpO2 96%   BMI 39.06 kg/m    Patient alert and oriented and in no cardiopulmonary distress.  HEENT: No facial asymmetry, EOMI,     Neck supple .  Chest: Clear to auscultation bilaterally.  CVS: S1, S2 no murmurs, no S3.Regular rate.  ABD: Soft non tender.   Ext: No edema  MS: Adequate though reduced  ROM spine, normal in shoulders, hips and knees.  Skin: Intact, no ulcerations or rash noted.  Psych: Good eye contact, normal affect. Memory intact not anxious or depressed appearing.  CNS: CN 2-12 intact, power,  normal throughout.no focal deficits noted.   Assessment & Plan  Type 2 diabetes mellitus with vascular disease [facility]) Mr. Alvidrez is reminded of the importance of commitment to daily physical activity for 30 minutes or more, as  able and the need to limit carbohydrate intake to 30 to 60 grams per meal to help with blood sugar control.   The need to take medication as prescribed, test blood sugar as directed, and to call between visits if there is a concern that blood sugar is uncontrolled is also discussed. Markedly improved, he is applauded on this   Mr. Sparacino is reminded of the importance of daily foot exam, annual eye examination, and good blood sugar, blood pressure and cholesterol control.  Diabetic Labs Latest Ref Rng & Units 08/23/2020 04/28/2020 08/12/2019 11/05/2018 05/31/2018  HbA1c 4.8 - 5.6 % - 8.7(H) 6.9(H) 7.3(H) 7.4(H)  Microalbumin Not Estab. ug/mL - - - - -  Micro/Creat Ratio - WILL FOLLOW - - - -  Chol 100 - 199 mg/dL - 161 189 191 179  HDL >39 mg/dL - 42 42 43 38(L)  Calc LDL 0 - 99 mg/dL - 89 115(H) 115(H) 100(H)  Triglycerides 0 - 149 mg/dL - 177(H) 203(H) 209(H) 288(H)  Creatinine 0.76 - 1.27 mg/dL 0.95 0.91 1.01 1.22 1.13   BP/Weight 08/24/2020 04/29/2020 04/01/2020 08/13/2019 07/18/2019 04/01/2019 0/17/5102  Systolic BP 585 277 824 235 361 443 154  Diastolic BP 99 80 84 84 88 82 82  Wt. (Lbs) 288 296 291 291 288.12 297 297  BMI 39.06 40.14 39.47 39.47 39.08 40.28 40.28   Foot/eye exam completion dates Latest Ref Rng & Units 05/05/2020 04/29/2020  Eye Exam No Retinopathy No Retinopathy -  Foot exam Order - - -  Foot Form Completion - -  Done        Essential hypertension Uncontrolled , increase clonidin edose DASH diet and commitment to daily physical activity for a minimum of 30 minutes discussed and encouraged, as a part of hypertension management. The importance of attaining a healthy weight is also discussed.  BP/Weight 08/24/2020 04/29/2020 04/01/2020 08/13/2019 07/18/2019 04/01/2019 2/54/9826  Systolic BP 415 830 940 768 088 110 315  Diastolic BP 88 80 84 84 88 82 82  Wt. (Lbs) 288 296 291 291 288.12 297 297  BMI 39.06 40.14 39.47 39.47 39.08 40.28 40.28       Morbid obesity  ([facility]) Obesity linked with HTN and diabetes  Patient re-educated about  the importance of commitment to a  minimum of 150 minutes of exercise per week as able.  The importance of healthy food choices with portion control discussed, as well as eating regularly and within a 12 hour window most days. The need to choose "clean , green" food 50 to 75% of the time is discussed, as well as to make water the primary drink and set a goal of 64 ounces water daily.    Weight /BMI 08/24/2020 04/29/2020 04/01/2020  WEIGHT 288 lb 296 lb 291 lb  HEIGHT 6\' 0"  6\' 0"  6\' 0"   BMI 39.06 kg/m2 40.14 kg/m2 39.47 kg/m2    Improved, is congratulated on this  Hyperlipidemia LDL goal <100 Hyperlipidemia:Low fat diet discussed and encouraged.   Lipid Panel  Lab Results  Component Value Date   CHOL 161 04/28/2020   HDL 42 04/28/2020   LDLCALC 89 04/28/2020   TRIG 177 (H) 04/28/2020   CHOLHDL 3.8 04/28/2020     Needs to educe fat in diet, review at next visit  TOBACCO ABUSE Asked:confirms currently smokes cigarettes 14 / day Assess: Unwilling to set a quit date, but is cutting back Advise: needs to QUIT to reduce risk of cancer, cardio and cerebrovascular disease Assist: counseled for 5 minutes and literature provided Arrange: follow up in 2 to 4 months

## 2020-08-24 NOTE — Assessment & Plan Note (Addendum)
Dakota Mendez is reminded of the importance of commitment to daily physical activity for 30 minutes or more, as able and the need to limit carbohydrate intake to 30 to 60 grams per meal to help with blood sugar control.   The need to take medication as prescribed, test blood sugar as directed, and to call between visits if there is a concern that blood sugar is uncontrolled is also discussed. Markedly improved, he is applauded on this   Dakota Mendez is reminded of the importance of daily foot exam, annual eye examination, and good blood sugar, blood pressure and cholesterol control.  Diabetic Labs Latest Ref Rng & Units 08/23/2020 04/28/2020 08/12/2019 11/05/2018 05/31/2018  HbA1c 4.8 - 5.6 % - 8.7(H) 6.9(H) 7.3(H) 7.4(H)  Microalbumin Not Estab. ug/mL - - - - -  Micro/Creat Ratio - WILL FOLLOW - - - -  Chol 100 - 199 mg/dL - 161 189 191 179  HDL >39 mg/dL - 42 42 43 38(L)  Calc LDL 0 - 99 mg/dL - 89 115(H) 115(H) 100(H)  Triglycerides 0 - 149 mg/dL - 177(H) 203(H) 209(H) 288(H)  Creatinine 0.76 - 1.27 mg/dL 0.95 0.91 1.01 1.22 1.13   BP/Weight 08/24/2020 04/29/2020 04/01/2020 08/13/2019 07/18/2019 04/01/2019 2/91/9166  Systolic BP 060 045 997 741 423 953 202  Diastolic BP 99 80 84 84 88 82 82  Wt. (Lbs) 288 296 291 291 288.12 297 297  BMI 39.06 40.14 39.47 39.47 39.08 40.28 40.28   Foot/eye exam completion dates Latest Ref Rng & Units 05/05/2020 04/29/2020  Eye Exam No Retinopathy No Retinopathy -  Foot exam Order - - -  Foot Form Completion - - Done

## 2020-08-25 ENCOUNTER — Other Ambulatory Visit: Payer: Self-pay | Admitting: Family Medicine

## 2020-08-25 LAB — MICROALBUMIN / CREATININE URINE RATIO
Creatinine, Urine: 198.8 mg/dL
Microalb/Creat Ratio: 34 mg/g creat — ABNORMAL HIGH (ref 0–29)
Microalbumin, Urine: 68.5 ug/mL

## 2020-08-25 LAB — BMP8+EGFR
BUN/Creatinine Ratio: 15 (ref 9–20)
BUN: 14 mg/dL (ref 6–24)
CO2: 22 mmol/L (ref 20–29)
Calcium: 9.5 mg/dL (ref 8.7–10.2)
Chloride: 102 mmol/L (ref 96–106)
Creatinine, Ser: 0.95 mg/dL (ref 0.76–1.27)
GFR calc Af Amer: 102 mL/min/{1.73_m2} (ref >59)
GFR calc non Af Amer: 88 mL/min/{1.73_m2} (ref >59)
Glucose: 139 mg/dL — ABNORMAL HIGH (ref 65–99)
Potassium: 4.1 mmol/L (ref 3.5–5.2)
Sodium: 141 mmol/L (ref 134–144)

## 2020-08-25 LAB — PSA: Prostate Specific Ag, Serum: 1.3 ng/mL (ref 0.0–4.0)

## 2020-08-25 LAB — CBC
Hematocrit: 40.8 % (ref 37.5–51.0)
Hemoglobin: 13.6 g/dL (ref 13.0–17.7)
MCH: 28 pg (ref 26.6–33.0)
MCHC: 33.3 g/dL (ref 31.5–35.7)
MCV: 84 fL (ref 79–97)
Platelets: 393 10*3/uL (ref 150–450)
RBC: 4.86 x10E6/uL (ref 4.14–5.80)
RDW: 13 % (ref 11.6–15.4)
WBC: 12.1 10*3/uL — ABNORMAL HIGH (ref 3.4–10.8)

## 2020-08-25 LAB — VITAMIN D 25 HYDROXY (VIT D DEFICIENCY, FRACTURES): Vit D, 25-Hydroxy: 21.1 ng/mL — ABNORMAL LOW (ref 30.0–100.0)

## 2020-08-28 ENCOUNTER — Encounter: Payer: Self-pay | Admitting: Family Medicine

## 2020-08-28 NOTE — Assessment & Plan Note (Signed)
Hyperlipidemia:Low fat diet discussed and encouraged.   Lipid Panel  Lab Results  Component Value Date   CHOL 161 04/28/2020   HDL 42 04/28/2020   LDLCALC 89 04/28/2020   TRIG 177 (H) 04/28/2020   CHOLHDL 3.8 04/28/2020     Needs to educe fat in diet, review at next visit

## 2020-08-28 NOTE — Assessment & Plan Note (Signed)
Asked:confirms currently smokes cigarettes 14 / day Assess: Unwilling to set a quit date, but is cutting back Advise: needs to QUIT to reduce risk of cancer, cardio and cerebrovascular disease Assist: counseled for 5 minutes and literature provided Arrange: follow up in 2 to 4 months

## 2020-08-28 NOTE — Assessment & Plan Note (Addendum)
Obesity linked with HTN and diabetes  Patient re-educated about  the importance of commitment to a  minimum of 150 minutes of exercise per week as able.  The importance of healthy food choices with portion control discussed, as well as eating regularly and within a 12 hour window most days. The need to choose "clean , green" food 50 to 75% of the time is discussed, as well as to make water the primary drink and set a goal of 64 ounces water daily.    Weight /BMI 08/24/2020 04/29/2020 04/01/2020  WEIGHT 288 lb 296 lb 291 lb  HEIGHT 6\' 0"  6\' 0"  6\' 0"   BMI 39.06 kg/m2 40.14 kg/m2 39.47 kg/m2    Improved, is congratulated on this

## 2020-08-28 NOTE — Assessment & Plan Note (Signed)
Uncontrolled , increase clonidin edose DASH diet and commitment to daily physical activity for a minimum of 30 minutes discussed and encouraged, as a part of hypertension management. The importance of attaining a healthy weight is also discussed.  BP/Weight 08/24/2020 04/29/2020 04/01/2020 08/13/2019 07/18/2019 04/01/2019 1/84/8592  Systolic BP 763 943 200 379 444 619 012  Diastolic BP 88 80 84 84 88 82 82  Wt. (Lbs) 288 296 291 291 288.12 297 297  BMI 39.06 40.14 39.47 39.47 39.08 40.28 40.28

## 2020-09-01 ENCOUNTER — Other Ambulatory Visit: Payer: Self-pay | Admitting: Family Medicine

## 2020-09-15 ENCOUNTER — Other Ambulatory Visit: Payer: Self-pay | Admitting: Family Medicine

## 2020-09-27 ENCOUNTER — Other Ambulatory Visit: Payer: Self-pay | Admitting: Family Medicine

## 2020-09-27 DIAGNOSIS — E1159 Type 2 diabetes mellitus with other circulatory complications: Secondary | ICD-10-CM

## 2020-10-11 ENCOUNTER — Other Ambulatory Visit: Payer: Self-pay | Admitting: Family Medicine

## 2020-10-15 DIAGNOSIS — I10 Essential (primary) hypertension: Secondary | ICD-10-CM | POA: Diagnosis not present

## 2020-10-15 DIAGNOSIS — E785 Hyperlipidemia, unspecified: Secondary | ICD-10-CM | POA: Diagnosis not present

## 2020-10-16 LAB — CMP14+EGFR
ALT: 10 IU/L (ref 0–44)
AST: 13 IU/L (ref 0–40)
Albumin/Globulin Ratio: 1.7 (ref 1.2–2.2)
Albumin: 4.3 g/dL (ref 3.8–4.9)
Alkaline Phosphatase: 75 IU/L (ref 44–121)
BUN/Creatinine Ratio: 15 (ref 9–20)
BUN: 16 mg/dL (ref 6–24)
Bilirubin Total: 0.7 mg/dL (ref 0.0–1.2)
CO2: 23 mmol/L (ref 20–29)
Calcium: 9.6 mg/dL (ref 8.7–10.2)
Chloride: 98 mmol/L (ref 96–106)
Creatinine, Ser: 1.04 mg/dL (ref 0.76–1.27)
GFR calc Af Amer: 92 mL/min/{1.73_m2} (ref >59)
GFR calc non Af Amer: 79 mL/min/{1.73_m2} (ref >59)
Globulin, Total: 2.6 g/dL (ref 1.5–4.5)
Glucose: 112 mg/dL — ABNORMAL HIGH (ref 65–99)
Potassium: 4.2 mmol/L (ref 3.5–5.2)
Sodium: 138 mmol/L (ref 134–144)
Total Protein: 6.9 g/dL (ref 6.0–8.5)

## 2020-10-16 LAB — LIPID PANEL
Chol/HDL Ratio: 3.9 ratio (ref 0.0–5.0)
Cholesterol, Total: 165 mg/dL (ref 100–199)
HDL: 42 mg/dL (ref >39)
LDL Chol Calc (NIH): 93 mg/dL (ref 0–99)
Triglycerides: 172 mg/dL — ABNORMAL HIGH (ref 0–149)
VLDL Cholesterol Cal: 30 mg/dL (ref 5–40)

## 2020-10-18 ENCOUNTER — Encounter: Payer: Self-pay | Admitting: Internal Medicine

## 2020-10-18 ENCOUNTER — Ambulatory Visit (INDEPENDENT_AMBULATORY_CARE_PROVIDER_SITE_OTHER): Payer: Medicare HMO | Admitting: Internal Medicine

## 2020-10-18 ENCOUNTER — Other Ambulatory Visit: Payer: Self-pay

## 2020-10-18 VITALS — BP 141/96 | HR 82 | Temp 98.1°F | Resp 18 | Ht 72.0 in | Wt 286.4 lb

## 2020-10-18 DIAGNOSIS — Z720 Tobacco use: Secondary | ICD-10-CM | POA: Diagnosis not present

## 2020-10-18 DIAGNOSIS — F17210 Nicotine dependence, cigarettes, uncomplicated: Secondary | ICD-10-CM

## 2020-10-18 DIAGNOSIS — G4733 Obstructive sleep apnea (adult) (pediatric): Secondary | ICD-10-CM

## 2020-10-18 DIAGNOSIS — I10 Essential (primary) hypertension: Secondary | ICD-10-CM

## 2020-10-18 MED ORDER — CLONIDINE HCL 0.3 MG PO TABS: 0.3000 mg | ORAL_TABLET | Freq: Every day | ORAL | 1 refills | Status: DC

## 2020-10-18 NOTE — Assessment & Plan Note (Signed)
Advised for OSA evaluation Patient wants to make sure of insurance coverage Will contact us with his decision

## 2020-10-18 NOTE — Assessment & Plan Note (Signed)
BP Readings from Last 1 Encounters:  10/18/20 (!) 141/96   uncontrolled with Amlodipine, Losartan-HCTZ and Clonidine Needs to cut down salt intake OSA evaluation, discussed about consequences of untreated OSA Counseled for compliance with the medications Advised DASH diet and moderate exercise/walking, at least 150 mins/week

## 2020-10-18 NOTE — Patient Instructions (Addendum)
Please follow low salt diet and perform moderate exercise/walking at least 150 mins/week.  Please continue to take medications as prescribed.  Please let us know with your decision regarding sleep study.  Please continue your efforts to cut down smoking.

## 2020-10-18 NOTE — Progress Notes (Signed)
Established Patient Office Visit  Subjective:  Patient ID: Dakota Mendez, male    DOB: 15-Feb-1961  Age: 60 y.o. MRN: [id number]  CC:  Chief Complaint  Patient presents with  . Follow-up    Follow up bp     HPI Dakota Mendez is a 60 year old male with PMH of uncontrolled HTN, DM, OSA and tobacco abuse who presents for follow up of HTN.  His BP is still elevated. He states that he needs to cut down salt intake. He has not been exercising due to cold weather. He has been trying to cut down smoking, now 0.5 pack/day. He denies any headache, dizziness, chest pain, dyspnea or palpitations.  He has had sleep study in the past, but did not get CPAP device. He wants to discuss with insurance first before starting OSA evaluation and treatment.  Past Medical History:  Diagnosis Date  . Chronic back pain   . Chronic neck pain   . Diabetes mellitus   . Hyperlipidemia   . Hypertension   . Obesity   . Sleep apnea    states he does not wear his CPAP  . Tobacco abuse     Past Surgical History:  Procedure Laterality Date  . CERVICAL SPINE SURGERY    . COLONOSCOPY N/A 08/08/2017   Procedure: COLONOSCOPY;  Surgeon: Rogene Houston, MD;  Location: [facility];  Service: Endoscopy;  Laterality: N/A;  930  . SPINE SURGERY  09/21/2011   cervial spine    Family History  Problem Relation Age of Onset  . Cirrhosis Father   . Heart attack Mother   . Depression Mother   . Stroke Mother   . Hearing loss Brother   . Cirrhosis Brother   . Diabetes Sister   . Sleep apnea Son   . Hypertension Son     Social History   Socioeconomic History  . Marital status: Married    Spouse name: Not on file  . Number of children: 4  . Years of education: Not on file  . Highest education level: 10th grade  Occupational History  . Occupation: disabled-truck dirver  Tobacco Use  . Smoking status: Current Every Day Smoker    Packs/day: 0.50    Years: 40.00    Pack years: 20.00    Types:  Cigarettes  . Smokeless tobacco: Never Used  . Tobacco comment: 1 PPD for 35 years  Vaping Use  . Vaping Use: Never used  Substance and Sexual Activity  . Alcohol use: No  . Drug use: No  . Sexual activity: Yes  Other Topics Concern  . Not on file  Social History Narrative  . Not on file   Social Determinants of Health   Financial Resource Strain: Low Risk   . Difficulty of Paying Living Expenses: Not hard at all  Food Insecurity: No Food Insecurity  . Worried About Charity fundraiser in the Last Year: Never true  . Ran Out of Food in the Last Year: Never true  Transportation Needs: No Transportation Needs  . Lack of Transportation (Medical): No  . Lack of Transportation (Non-Medical): No  Physical Activity: Insufficiently Active  . Days of Exercise per Week: 3 days  . Minutes of Exercise per Session: 30 min  Stress: No Stress Concern Present  . Feeling of Stress : Not at all  Social Connections: Moderately Integrated  . Frequency of Communication with Friends and Family: More than three times a week  . Frequency of  Social Gatherings with Friends and Family: More than three times a week  . Attends Religious Services: 1 to 4 times per year  . Active Member of Clubs or Organizations: No  . Attends Archivist Meetings: Never  . Marital Status: Married  Human resources officer Violence: Not At Risk  . Fear of Current or Ex-Partner: No  . Emotionally Abused: No  . Physically Abused: No  . Sexually Abused: No    Outpatient Medications Prior to Visit  Medication Sig Dispense Refill  . Alcohol Swabs PADS 1 each by Does not apply route as directed. 100 each 0  . amLODipine (NORVASC) 10 MG tablet TAKE 1 TABLET EVERY DAY 90 tablet 1  . Blood Glucose Monitoring Suppl (TRUE METRIX METER) w/Device KIT USE AS DIRECTED 1 kit 0  . glipiZIDE (GLUCOTROL XL) 10 MG 24 hr tablet TAKE 1 TABLET (10 MG TOTAL) BY MOUTH DAILY WITH BREAKFAST. 90 tablet 1  . glucose blood (ONE TOUCH ULTRA  TEST) test strip Use as instructed for once daily testing  Dx 250.00 length of need- 99 months 100 each 11  . ibuprofen (IBU) 800 MG tablet TAKE 1 TABLET TWICE DAILY AS NEEDED FOR PAIN 180 tablet 0  . losartan-hydrochlorothiazide (HYZAAR) 100-25 MG tablet TAKE 1 TABLET EVERY DAY 90 tablet 1  . metFORMIN (GLUCOPHAGE) 1000 MG tablet Take 1 tablet (1,000 mg total) by mouth 2 (two) times daily with a meal. 180 tablet 1  . potassium chloride (KLOR-CON) 10 MEQ tablet TAKE 1 TABLET EVERY DAY 90 tablet 0  . pravastatin (PRAVACHOL) 80 MG tablet Take 1 tablet (80 mg total) by mouth daily. 90 tablet 1  . TRUEplus Lancets 33G MISC 1 each by Does not apply route as directed. 100 each 1  . cloNIDine (CATAPRES) 0.3 MG tablet Take 1 tablet (0.3 mg total) by mouth daily. 90 tablet 1  . hydrochlorothiazide (MICROZIDE) 12.5 MG capsule TAKE 1 CAPSULE EVERY DAY 90 capsule 0   No facility-administered medications prior to visit.    No Known Allergies  ROS Review of Systems  Constitutional: Negative for chills and fever.  HENT: Negative for congestion and sore throat.   Eyes: Negative for pain and discharge.  Respiratory: Negative for cough and shortness of breath.   Cardiovascular: Negative for chest pain and palpitations.  Gastrointestinal: Negative for constipation, diarrhea, nausea and vomiting.  Endocrine: Negative for polydipsia and polyuria.  Genitourinary: Negative for dysuria and hematuria.  Musculoskeletal: Negative for neck pain and neck stiffness.  Skin: Negative for rash.  Neurological: Negative for dizziness, weakness, numbness and headaches.  Psychiatric/Behavioral: Negative for agitation and behavioral problems.      Objective:    Physical Exam Vitals reviewed.  Constitutional:      General: He is not in acute distress.    Appearance: He is not diaphoretic.  HENT:     Head: Normocephalic and atraumatic.     Nose: Nose normal.     Mouth/Throat:     Mouth: Mucous membranes are  moist.  Eyes:     General: No scleral icterus.    Extraocular Movements: Extraocular movements intact.     Pupils: Pupils are equal, round, and reactive to light.  Cardiovascular:     Rate and Rhythm: Normal rate and regular rhythm.     Heart sounds: No murmur heard.   Pulmonary:     Breath sounds: Normal breath sounds. No wheezing or rales.  Musculoskeletal:     Cervical back: Neck supple. No tenderness.  Right lower leg: No edema.     Left lower leg: No edema.  Skin:    General: Skin is warm.     Findings: No rash.  Neurological:     General: No focal deficit present.     Mental Status: He is alert and oriented to person, place, and time.  Psychiatric:        Mood and Affect: Mood normal.        Behavior: Behavior normal.     BP (!) 141/96 (BP Location: Right Arm, Patient Position: Sitting)   Pulse 82   Temp 98.1 F (36.7 C) (Oral)   Resp 18   Ht 6' (1.829 m)   Wt 286 lb 6.4 oz (129.9 kg)   SpO2 97%   BMI 38.84 kg/m  Wt Readings from Last 3 Encounters:  10/18/20 286 lb 6.4 oz (129.9 kg)  08/24/20 288 lb (130.6 kg)  04/29/20 296 lb (134.3 kg)     There are no preventive care reminders to display for this patient.  There are no preventive care reminders to display for this patient.  Lab Results  Component Value Date   TSH 3.11 08/12/2019   Lab Results  Component Value Date   WBC 12.1 (H) 08/23/2020   HGB 13.6 08/23/2020   HCT 40.8 08/23/2020   MCV 84 08/23/2020   PLT 393 08/23/2020   Lab Results  Component Value Date   NA 138 10/15/2020   K 4.2 10/15/2020   CO2 23 10/15/2020   GLUCOSE 112 (H) 10/15/2020   BUN 16 10/15/2020   CREATININE 1.04 10/15/2020   BILITOT 0.7 10/15/2020   ALKPHOS 75 10/15/2020   AST 13 10/15/2020   ALT 10 10/15/2020   PROT 6.9 10/15/2020   ALBUMIN 4.3 10/15/2020   CALCIUM 9.6 10/15/2020   ANIONGAP 9 09/18/2011   Lab Results  Component Value Date   CHOL 165 10/15/2020   Lab Results  Component Value Date   HDL  42 10/15/2020   Lab Results  Component Value Date   LDLCALC 93 10/15/2020   Lab Results  Component Value Date   TRIG 172 (H) 10/15/2020   Lab Results  Component Value Date   CHOLHDL 3.9 10/15/2020   Lab Results  Component Value Date   HGBA1C 6.8 08/24/2020      Assessment & Plan:   Problem List Items Addressed This Visit      Cardiovascular and Mediastinum   Essential hypertension - Primary    BP Readings from Last 1 Encounters:  10/18/20 (!) 141/96   uncontrolled with Amlodipine, Losartan-HCTZ and Clonidine Needs to cut down salt intake OSA evaluation, discussed about consequences of untreated OSA Counseled for compliance with the medications Advised DASH diet and moderate exercise/walking, at least 150 mins/week       Relevant Medications   cloNIDine (CATAPRES) 0.3 MG tablet     Respiratory   Obstructive sleep apnea    Advised for OSA evaluation Patient wants to make sure of insurance coverage Will contact us with his decision        Other   Tobacco abuse    Smokes 0.5 pack/day  Asked about quitting: confirms that he currently smokes cigarettes Advise to quit smoking: Educated about QUITTING to reduce the risk of cancer, cardio and cerebrovascular disease. Assess willingness: Unwilling to quit at this time, but is working on cutting back. Assist with counseling and pharmacotherapy: Counseled for 5 minutes and literature provided. Arrange for follow up: Follow up in 3  months and continue to offer help.         Meds ordered this encounter  Medications  . cloNIDine (CATAPRES) 0.3 MG tablet    Sig: Take 1 tablet (0.3 mg total) by mouth daily.    Dispense:  90 tablet    Refill:  1    Dose increase effective 08/24/2020    Follow-up: Return in about 3 months (around 01/15/2021) for HTN follow up and OSA evaluation.    Lindell Spar, MD

## 2020-10-18 NOTE — Assessment & Plan Note (Signed)
Smokes 0.5 pack/day  Asked about quitting: confirms that he currently smokes cigarettes Advise to quit smoking: Educated about QUITTING to reduce the risk of cancer, cardio and cerebrovascular disease. Assess willingness: Unwilling to quit at this time, but is working on cutting back. Assist with counseling and pharmacotherapy: Counseled for 5 minutes and literature provided. Arrange for follow up: Follow up in 3 months and continue to offer help. 

## 2020-11-01 ENCOUNTER — Other Ambulatory Visit: Payer: Self-pay | Admitting: Family Medicine

## 2020-11-22 ENCOUNTER — Other Ambulatory Visit: Payer: Self-pay | Admitting: Family Medicine

## 2020-11-29 ENCOUNTER — Other Ambulatory Visit: Payer: Self-pay | Admitting: Family Medicine

## 2020-11-29 DIAGNOSIS — E1159 Type 2 diabetes mellitus with other circulatory complications: Secondary | ICD-10-CM

## 2020-12-15 ENCOUNTER — Other Ambulatory Visit: Payer: Self-pay | Admitting: Family Medicine

## 2020-12-20 ENCOUNTER — Other Ambulatory Visit: Payer: Self-pay | Admitting: Family Medicine

## 2020-12-29 ENCOUNTER — Other Ambulatory Visit: Payer: Self-pay | Admitting: Family Medicine

## 2021-01-12 ENCOUNTER — Other Ambulatory Visit: Payer: Self-pay | Admitting: Family Medicine

## 2021-01-17 ENCOUNTER — Ambulatory Visit: Payer: Medicare HMO | Admitting: Family Medicine

## 2021-01-28 ENCOUNTER — Other Ambulatory Visit (HOSPITAL_COMMUNITY): Payer: Self-pay

## 2021-01-28 DIAGNOSIS — Z87891 Personal history of nicotine dependence: Secondary | ICD-10-CM

## 2021-01-28 DIAGNOSIS — Z122 Encounter for screening for malignant neoplasm of respiratory organs: Secondary | ICD-10-CM

## 2021-01-28 NOTE — Progress Notes (Signed)
I contacted the patient regarding follow-up LDCT. Obtained smoking history (started age 60 smoking 1PPD, current smoker continuing to smoke 1PPD, 43 pack year) as well as answering questions related to the screening process. Patient denies signs/symptoms of lung cancer such as weight loss or hemoptysis. Patient denies comorbidity that would prevent curative treatment if lung cancer were to be found. Patient reports interest in lung cancer screening, pending insurance authorization.

## 2021-01-31 ENCOUNTER — Other Ambulatory Visit: Payer: Self-pay | Admitting: Family Medicine

## 2021-01-31 ENCOUNTER — Encounter: Payer: Self-pay | Admitting: Family Medicine

## 2021-01-31 ENCOUNTER — Ambulatory Visit (INDEPENDENT_AMBULATORY_CARE_PROVIDER_SITE_OTHER): Payer: Medicare HMO | Admitting: Family Medicine

## 2021-01-31 ENCOUNTER — Other Ambulatory Visit: Payer: Self-pay

## 2021-01-31 VITALS — BP 120/82 | HR 100 | Resp 16 | Ht 72.0 in | Wt 277.0 lb

## 2021-01-31 DIAGNOSIS — E1159 Type 2 diabetes mellitus with other circulatory complications: Secondary | ICD-10-CM

## 2021-01-31 DIAGNOSIS — F17210 Nicotine dependence, cigarettes, uncomplicated: Secondary | ICD-10-CM | POA: Diagnosis not present

## 2021-01-31 DIAGNOSIS — Z1321 Encounter for screening for nutritional disorder: Secondary | ICD-10-CM | POA: Diagnosis not present

## 2021-01-31 DIAGNOSIS — Z720 Tobacco use: Secondary | ICD-10-CM

## 2021-01-31 DIAGNOSIS — Z1329 Encounter for screening for other suspected endocrine disorder: Secondary | ICD-10-CM | POA: Diagnosis not present

## 2021-01-31 DIAGNOSIS — E785 Hyperlipidemia, unspecified: Secondary | ICD-10-CM

## 2021-01-31 DIAGNOSIS — I10 Essential (primary) hypertension: Secondary | ICD-10-CM | POA: Diagnosis not present

## 2021-01-31 DIAGNOSIS — Z125 Encounter for screening for malignant neoplasm of prostate: Secondary | ICD-10-CM

## 2021-01-31 NOTE — Assessment & Plan Note (Signed)
Asked:confirms currently smokes cigarettes approx 10/day Assess: Unwilling to set a quit date, but is cutting back Advise: needs to QUIT to reduce risk of cancer, cardio and cerebrovascular disease Assist: counseled for 5 minutes and literature provided Arrange: follow up in 2 to 4 months  

## 2021-01-31 NOTE — Progress Notes (Signed)
Dakota Mendez     MRN: [id number]      DOB: 11-06-1960   HPI Dakota Mendez is here for follow up and re-evaluation of chronic medical conditions, medication management and review of any available recent lab and radiology data.  Preventive health is updated, specifically  Cancer screening and Immunization.   Questions or concerns regarding consultations or procedures which the PT has had in the interim are  addressed. The PT denies any adverse reactions to current medications since the last visit.  There are no new concerns.  There are no specific complaints  Denies polyuria, polydipsia, blurred vision , or hypoglycemic episodes.   ROS Denies recent fever or chills. Denies sinus pressure, nasal congestion, ear pain or sore throat. Denies chest congestion, productive cough or wheezing. Denies chest pains, palpitations and leg swelling Denies abdominal pain, nausea, vomiting,diarrhea or constipation.   Denies dysuria, frequency, hesitancy or incontinence. Denies uncontrolled  joint pain, swelling and limitation in mobility. Denies headaches, seizures, numbness, or tingling. Denies depression, anxiety or insomnia. Denies skin break down or rash.   PE  BP (!) 145/97   Pulse 100   Resp 16   Ht 6' (1.829 m)   Wt 277 lb (125.6 kg)   SpO2 97%   BMI 37.57 kg/m   Patient alert and oriented and in no cardiopulmonary distress.  HEENT: No facial asymmetry, EOMI,     Neck supple .  Chest: Clear to auscultation bilaterally.  CVS: S1, S2 no murmurs, no S3.Regular rate.  ABD: Soft non tender.   Ext: No edema  MS: decreased  ROM lumbar spine,adequate in shoulders, hips and knees.  Skin: Intact, no ulcerations or rash noted.  Psych: Good eye contact, normal affect. Memory intact not anxious or depressed appearing.  CNS: CN 2-12 intact, power,  normal throughout.no focal deficits noted.   Assessment & Plan  Essential hypertension Controlled, no change in medication DASH  diet and commitment to daily physical activity for a minimum of 30 minutes discussed and encouraged, as a part of hypertension management. The importance of attaining a healthy weight is also discussed.  BP/Weight 01/31/2021 10/18/2020 08/24/2020 04/29/2020 04/01/2020 08/13/2019 56/10/1306  Systolic BP 657 846 962 952 841 324 401  Diastolic BP 82 96 88 80 84 84 88  Wt. (Lbs) 277 286.4 288 296 291 291 288.12  BMI 37.57 38.84 39.06 40.14 39.47 39.47 39.08       Morbid obesity (HCC)  Patient re-educated about  the importance of commitment to a  minimum of 150 minutes of exercise per week as able.  The importance of healthy food choices with portion control discussed, as well as eating regularly and within a 12 hour window most days. The need to choose "clean , green" food 50 to 75% of the time is discussed, as well as to make water the primary drink and set a goal of 64 ounces water daily.    Weight /BMI 01/31/2021 10/18/2020 08/24/2020  WEIGHT 277 lb 286 lb 6.4 oz 288 lb  HEIGHT 6\' 0"  6\' 0"  6\' 0"   BMI 37.57 kg/m2 38.84 kg/m2 39.06 kg/m2      Tobacco abuse Asked:confirms currently smokes cigarettes approx 10/day Assess: Unwilling to set a quit date, but is cutting back Advise: needs to QUIT to reduce risk of cancer, cardio and cerebrovascular disease Assist: counseled for 5 minutes and literature provided Arrange: follow up in 2 to 4 months   Type 2 diabetes mellitus with vascular disease ([place]) Dakota Mendez  is reminded of the importance of commitment to daily physical activity for 30 minutes or more, as able and the need to limit carbohydrate intake to 30 to 60 grams per meal to help with blood sugar control.   The need to take medication as prescribed, test blood sugar as directed, and to call between visits if there is a concern that blood sugar is uncontrolled is also discussed.   Dakota Mendez is reminded of the importance of daily foot exam, annual eye examination, and good blood  sugar, blood pressure and cholesterol control.  Diabetic Labs Latest Ref Rng & Units 10/15/2020 08/24/2020 08/23/2020 04/28/2020 08/12/2019  HbA1c 0.0 - 7.0 % - 6.8 - 8.7(H) 6.9(H)  Microalbumin Not Estab. ug/mL - - - - -  Micro/Creat Ratio 0 - 29 mg/g creat - - 34(H) - -  Chol 100 - 199 mg/dL 165 - - 161 189  HDL >39 mg/dL 42 - - 42 42  Calc LDL 0 - 99 mg/dL 93 - - 89 115(H)  Triglycerides 0 - 149 mg/dL 172(H) - - 177(H) 203(H)  Creatinine 0.76 - 1.27 mg/dL 1.04 - 0.95 0.91 1.01   BP/Weight 01/31/2021 10/18/2020 08/24/2020 04/29/2020 04/01/2020 08/13/2019 43/09/5398  Systolic BP 867 619 509 326 712 458 099  Diastolic BP 82 96 88 80 84 84 88  Wt. (Lbs) 277 286.4 288 296 291 291 288.12  BMI 37.57 38.84 39.06 40.14 39.47 39.47 39.08   Foot/eye exam completion dates Latest Ref Rng & Units 04/29/2020 04/05/2020  Eye Exam No Retinopathy - No Retinopathy  Foot exam Order - - -  Foot Form Completion - Done -   Updated lab needed at/ before next visit.

## 2021-01-31 NOTE — Assessment & Plan Note (Signed)
[name] is reminded of the importance of commitment to daily physical activity for 30 minutes or more, as able and the need to limit carbohydrate intake to 30 to 60 grams per meal to help with blood sugar control.   The need to take medication as prescribed, test blood sugar as directed, and to call between visits if there is a concern that blood sugar is uncontrolled is also discussed.   [name] is reminded of the importance of daily foot exam, annual eye examination, and good blood sugar, blood pressure and cholesterol control.  Diabetic Labs Latest Ref Rng & Units 10/15/2020 08/24/2020 08/23/2020 04/28/2020 08/12/2019  HbA1c 0.0 - 7.0 % - 6.8 - 8.7(H) 6.9(H)  Microalbumin Not Estab. ug/mL - - - - -  Micro/Creat Ratio 0 - 29 mg/g creat - - 34(H) - -  Chol 100 - 199 mg/dL 165 - - 161 189  HDL >39 mg/dL 42 - - 42 42  Calc LDL 0 - 99 mg/dL 93 - - 89 115(H)  Triglycerides 0 - 149 mg/dL 172(H) - - 177(H) 203(H)  Creatinine 0.76 - 1.27 mg/dL 1.04 - 0.95 0.91 1.01   BP/Weight 01/31/2021 10/18/2020 08/24/2020 04/29/2020 04/01/2020 08/13/2019 29/01/6212  Systolic BP 086 578 469 629 528 413 244  Diastolic BP 82 96 88 80 84 84 88  Wt. (Lbs) 277 286.4 288 296 291 291 288.12  BMI 37.57 38.84 39.06 40.14 39.47 39.47 39.08   Foot/eye exam completion dates Latest Ref Rng & Units 04/29/2020 04/05/2020  Eye Exam No Retinopathy - No Retinopathy  Foot exam Order - - -  Foot Form Completion - Done -   Updated lab needed at/ before next visit.

## 2021-01-31 NOTE — Patient Instructions (Addendum)
Annual physical exam end August/ early Septemeber, call if you ned me sooner  Chem 7 and EGFR, hBA1C and PSA ,TSH and vit D today  Fasting lipid, cmp and EGFr , hBa1C,m 5 days before next visit, August/ September  It is important that you exercise regularly at least 30 minutes 5 times a week. If you develop chest pain, have severe difficulty breathing, or feel very tired, stop exercising immediately and seek medical attention   Think about what you will eat, plan ahead. Choose " clean, green, fresh or frozen" over canned, processed or packaged foods which are more sugary, salty and fatty. 70 to 75% of food eaten should be vegetables and fruit. Three meals at set times with snacks allowed between meals, but they must be fruit or vegetables. Aim to eat over a 12 hour period , example 7 am to 7 pm, and STOP after  your last meal of the day. Drink water,generally about 64 ounces per day, no other drink is as healthy. Fruit juice is best enjoyed in a healthy way, by EATING the fruit.  Thanks for choosing [facility], we consider it a privelige to serve you.

## 2021-01-31 NOTE — Assessment & Plan Note (Signed)
Controlled, no change in medication DASH diet and commitment to daily physical activity for a minimum of 30 minutes discussed and encouraged, as a part of hypertension management. The importance of attaining a healthy weight is also discussed.  BP/Weight 01/31/2021 10/18/2020 08/24/2020 04/29/2020 04/01/2020 08/13/2019 93/01/7016  Systolic BP 793 903 009 233 007 622 633  Diastolic BP 82 96 88 80 84 84 88  Wt. (Lbs) 277 286.4 288 296 291 291 288.12  BMI 37.57 38.84 39.06 40.14 39.47 39.47 39.08

## 2021-01-31 NOTE — Assessment & Plan Note (Signed)
  Patient re-educated about  the importance of commitment to a  minimum of 150 minutes of exercise per week as able.  The importance of healthy food choices with portion control discussed, as well as eating regularly and within a 12 hour window most days. The need to choose "clean , green" food 50 to 75% of the time is discussed, as well as to make water the primary drink and set a goal of 64 ounces water daily.    Weight /BMI 01/31/2021 10/18/2020 08/24/2020  WEIGHT 277 lb 286 lb 6.4 oz 288 lb  HEIGHT 6\' 0"  6\' 0"  6\' 0"   BMI 37.57 kg/m2 38.84 kg/m2 39.06 kg/m2

## 2021-02-01 ENCOUNTER — Other Ambulatory Visit: Payer: Self-pay | Admitting: Family Medicine

## 2021-02-01 LAB — BASIC METABOLIC PANEL
BUN/Creatinine Ratio: 17 (ref 9–20)
BUN: 18 mg/dL (ref 6–24)
CO2: 20 mmol/L (ref 20–29)
Calcium: 9.7 mg/dL (ref 8.7–10.2)
Chloride: 96 mmol/L (ref 96–106)
Creatinine, Ser: 1.05 mg/dL (ref 0.76–1.27)
Glucose: 74 mg/dL (ref 65–99)
Potassium: 4 mmol/L (ref 3.5–5.2)
Sodium: 138 mmol/L (ref 134–144)
eGFR: 83 mL/min/{1.73_m2} (ref >59)

## 2021-02-01 LAB — TSH: TSH: 2.85 u[IU]/mL (ref 0.450–4.500)

## 2021-02-01 LAB — PSA: Prostate Specific Ag, Serum: 1.2 ng/mL (ref 0.0–4.0)

## 2021-02-01 LAB — HEMOGLOBIN A1C
Est. average glucose Bld gHb Est-mCnc: 126 mg/dL
Hgb A1c MFr Bld: 6 % — ABNORMAL HIGH (ref 4.8–5.6)

## 2021-02-01 LAB — VITAMIN D 25 HYDROXY (VIT D DEFICIENCY, FRACTURES): Vit D, 25-Hydroxy: 43 ng/mL (ref 30.0–100.0)

## 2021-02-01 MED ORDER — METFORMIN HCL 1000 MG PO TABS: 1000.0000 mg | ORAL_TABLET | Freq: Every day | ORAL | 3 refills | Status: DC

## 2021-02-23 ENCOUNTER — Other Ambulatory Visit: Payer: Self-pay | Admitting: Family Medicine

## 2021-03-14 ENCOUNTER — Other Ambulatory Visit: Payer: Self-pay | Admitting: Family Medicine

## 2021-03-14 ENCOUNTER — Other Ambulatory Visit: Payer: Self-pay | Admitting: Internal Medicine

## 2021-03-14 DIAGNOSIS — I10 Essential (primary) hypertension: Secondary | ICD-10-CM

## 2021-04-05 ENCOUNTER — Ambulatory Visit (INDEPENDENT_AMBULATORY_CARE_PROVIDER_SITE_OTHER): Payer: Medicare HMO | Admitting: *Deleted

## 2021-04-05 ENCOUNTER — Other Ambulatory Visit: Payer: Self-pay

## 2021-04-05 DIAGNOSIS — Z0000 Encounter for general adult medical examination without abnormal findings: Secondary | ICD-10-CM | POA: Diagnosis not present

## 2021-04-05 NOTE — Patient Instructions (Signed)
[name] , Thank you for taking time to come for your Medicare Wellness Visit. I appreciate your ongoing commitment to your health goals. Please review the following plan we discussed and let me know if I can assist you in the future.   Screening recommendations/referrals: Colonoscopy: Completed Due 08-09-27 Recommended yearly ophthalmology/optometry visit for glaucoma screening and checkup Recommended yearly dental visit for hygiene and checkup  Vaccinations: Influenza vaccine: Completed Due 04-11-21 Pneumococcal vaccine: Completed  Tdap vaccine: Due now information provided Shingles vaccine: Due now information provided     Advanced directives: Health care power of attorney copy requested   Conditions/risks identified: Hypertension, Diabetes  Next appointment: 1 year   Preventive Care 40-64 Years, Male Preventive care refers to lifestyle choices and visits with your health care provider that can promote health and wellness. What does preventive care include? A yearly physical exam. This is also called an annual well check. Dental exams once or twice a year. Routine eye exams. Ask your health care provider how often you should have your eyes checked. Personal lifestyle choices, including: Daily care of your teeth and gums. Regular physical activity. Eating a healthy diet. Avoiding tobacco and drug use. Limiting alcohol use. Practicing safe sex. Taking low-dose aspirin every day starting at age 77. What happens during an annual well check? The services and screenings done by your health care provider during your annual well check will depend on your age, overall health, lifestyle risk factors, and family history of disease. Counseling  Your health care provider may ask you questions about your: Alcohol use. Tobacco use. Drug use. Emotional well-being. Home and relationship well-being. Sexual activity. Eating habits. Work and work Statistician. Screening  You may have  the following tests or measurements: Height, weight, and BMI. Blood pressure. Lipid and cholesterol levels. These may be checked every 5 years, or more frequently if you are over 65 years old. Skin check. Lung cancer screening. You may have this screening every year starting at age 82 if you have a 30-pack-year history of smoking and currently smoke or have quit within the past 15 years. Fecal occult blood test (FOBT) of the stool. You may have this test every year starting at age 63. Flexible sigmoidoscopy or colonoscopy. You may have a sigmoidoscopy every 5 years or a colonoscopy every 10 years starting at age 16. Prostate cancer screening. Recommendations will vary depending on your family history and other risks. Hepatitis C blood test. Hepatitis B blood test. Sexually transmitted disease (STD) testing. Diabetes screening. This is done by checking your blood sugar (glucose) after you have not eaten for a while (fasting). You may have this done every 1-3 years. Discuss your test results, treatment options, and if necessary, the need for more tests with your health care provider. Vaccines  Your health care provider may recommend certain vaccines, such as: Influenza vaccine. This is recommended every year. Tetanus, diphtheria, and acellular pertussis (Tdap, Td) vaccine. You may need a Td booster every 10 years. Zoster vaccine. You may need this after age 10. Pneumococcal 13-valent conjugate (PCV13) vaccine. You may need this if you have certain conditions and have not been vaccinated. Pneumococcal polysaccharide (PPSV23) vaccine. You may need one or two doses if you smoke cigarettes or if you have certain conditions. Talk to your health care provider about which screenings and vaccines you need and how often you need them. This information is not intended to replace advice given to you by your health care provider. Make sure you discuss  any questions you have with your health care  provider. Document Released: 09/24/2015 Document Revised: 05/17/2016 Document Reviewed: 06/29/2015 Elsevier Interactive Patient Education  2017 [place] Prevention in the Home Falls can cause injuries. They can happen to people of all ages. There are many things you can do to make your home safe and to help prevent falls. What can I do on the outside of my home? Regularly fix the edges of walkways and driveways and fix any cracks. Remove anything that might make you trip as you walk through a door, such as a raised step or threshold. Trim any bushes or trees on the path to your home. Use bright outdoor lighting. Clear any walking paths of anything that might make someone trip, such as rocks or tools. Regularly check to see if handrails are loose or broken. Make sure that both sides of any steps have handrails. Any raised decks and porches should have guardrails on the edges. Have any leaves, snow, or ice cleared regularly. Use sand or salt on walking paths during winter. Clean up any spills in your garage right away. This includes oil or grease spills. What can I do in the bathroom? Use night lights. Install grab bars by the toilet and in the tub and shower. Do not use towel bars as grab bars. Use non-skid mats or decals in the tub or shower. If you need to sit down in the shower, use a plastic, non-slip stool. Keep the floor dry. Clean up any water that spills on the floor as soon as it happens. Remove soap buildup in the tub or shower regularly. Attach bath mats securely with double-sided non-slip rug tape. Do not have throw rugs and other things on the floor that can make you trip. What can I do in the bedroom? Use night lights. Make sure that you have a light by your bed that is easy to reach. Do not use any sheets or blankets that are too big for your bed. They should not hang down onto the floor. Have a firm chair that has side arms. You can use this for support while  you get dressed. Do not have throw rugs and other things on the floor that can make you trip. What can I do in the kitchen? Clean up any spills right away. Avoid walking on wet floors. Keep items that you use a lot in easy-to-reach places. If you need to reach something above you, use a strong step stool that has a grab bar. Keep electrical cords out of the way. Do not use floor polish or wax that makes floors slippery. If you must use wax, use non-skid floor wax. Do not have throw rugs and other things on the floor that can make you trip. What can I do with my stairs? Do not leave any items on the stairs. Make sure that there are handrails on both sides of the stairs and use them. Fix handrails that are broken or loose. Make sure that handrails are as long as the stairways. Check any carpeting to make sure that it is firmly attached to the stairs. Fix any carpet that is loose or worn. Avoid having throw rugs at the top or bottom of the stairs. If you do have throw rugs, attach them to the floor with carpet tape. Make sure that you have a light switch at the top of the stairs and the bottom of the stairs. If you do not have them, ask someone to add  them for you. What else can I do to help prevent falls? Wear shoes that: Do not have high heels. Have rubber bottoms. Are comfortable and fit you well. Are closed at the toe. Do not wear sandals. If you use a stepladder: Make sure that it is fully opened. Do not climb a closed stepladder. Make sure that both sides of the stepladder are locked into place. Ask someone to hold it for you, if possible. Clearly mark and make sure that you can see: Any grab bars or handrails. First and last steps. Where the edge of each step is. Use tools that help you move around (mobility aids) if they are needed. These include: Canes. Walkers. Scooters. Crutches. Turn on the lights when you go into a dark area. Replace any light bulbs as soon as they burn  out. Set up your furniture so you have a clear path. Avoid moving your furniture around. If any of your floors are uneven, fix them. If there are any pets around you, be aware of where they are. Review your medicines with your doctor. Some medicines can make you feel dizzy. This can increase your chance of falling. Ask your doctor what other things that you can do to help prevent falls. This information is not intended to replace advice given to you by your health care provider. Make sure you discuss any questions you have with your health care provider. Document Released: 06/24/2009 Document Revised: 02/03/2016 Document Reviewed: 10/02/2014 Elsevier Interactive Patient Education  2017 [employer].

## 2021-04-05 NOTE — Progress Notes (Signed)
Subjective:   Dakota Mendez is a 60 y.o. male who presents for Medicare Annual/Subsequent preventive examination.  Review of Systems           Objective:    There were no vitals filed for this visit. There is no height or weight on file to calculate BMI.  Advanced Directives 03/27/2018 08/08/2017 01/08/2017 08/24/2015 03/04/2015 02/17/2015  Does Patient Have a Medical Advance Directive? Yes _0   Type of Paramedic of [place];Living will - - - - -  Copy of [place] in Chart? No - copy requested - - - - -  Would patient like information on creating a medical advance directive? - No - Patient declined Yes (MAU/Ambulatory/Procedural Areas - Information given) No - patient declined information No - patient declined information No - patient declined information    Current Medications (verified) Outpatient Encounter Medications as of 04/05/2021  Medication Sig   Alcohol Swabs PADS 1 each by Does not apply route as directed.   amLODipine (NORVASC) 10 MG tablet TAKE 1 TABLET EVERY DAY   Blood Glucose Monitoring Suppl (TRUE METRIX METER) w/Device KIT USE AS DIRECTED   cloNIDine (CATAPRES) 0.3 MG tablet TAKE 1 TABLET EVERY DAY   glipiZIDE (GLUCOTROL XL) 10 MG 24 hr tablet TAKE 1 TABLET EVERY DAY WITH BREAKFAST   glucose blood (ONE TOUCH ULTRA TEST) test strip Use as instructed for once daily testing  Dx 250.00 length of need- 99 months   ibuprofen (IBU) 800 MG tablet TAKE 1 TABLET TWICE DAILY AS NEEDED FOR PAIN   losartan-hydrochlorothiazide (HYZAAR) 100-25 MG tablet TAKE 1 TABLET EVERY DAY   metFORMIN (GLUCOPHAGE) 1000 MG tablet Take 1 tablet (1,000 mg total) by mouth daily with breakfast.   potassium chloride (KLOR-CON) 10 MEQ tablet TAKE 1 TABLET EVERY DAY   pravastatin (PRAVACHOL) 80 MG tablet TAKE 1 TABLET EVERY DAY   TRUEplus Lancets 33G MISC 1 each by Does not apply route as directed.   No facility-administered encounter  medications on file as of 04/05/2021.    Allergies (verified) Patient has no known allergies.   History: Past Medical History:  Diagnosis Date   Chronic back pain    Chronic neck pain    Diabetes mellitus    Hyperlipidemia    Hypertension    Obesity    Sleep apnea    states he does not wear his CPAP   Tobacco abuse    Past Surgical History:  Procedure Laterality Date   CERVICAL SPINE SURGERY     COLONOSCOPY N/A 08/08/2017   Procedure: COLONOSCOPY;  Surgeon: Rogene Houston, MD;  Location: [facility];  Service: Endoscopy;  Laterality: N/A;  [place]  09/21/2011   cervial spine   Family History  Problem Relation Age of Onset   Cirrhosis Father    Heart attack Mother    Depression Mother    Stroke Mother    Hearing loss Brother    Cirrhosis Brother    Diabetes Sister    Sleep apnea Son    Hypertension Son    Social History   Socioeconomic History   Marital status: Married    Spouse name: Not on file   Number of children: 4   Years of education: Not on file   Highest education level: 10th grade  Occupational History   Occupation: disabled-truck dirver  Tobacco Use   Smoking status: Every Day    Packs/day: 0.50  Years: 40.00    Pack years: 20.00    Types: Cigarettes   Smokeless tobacco: Never   Tobacco comments:    1 PPD for 35 years  Vaping Use   Vaping Use: Never used  Substance and Sexual Activity   Alcohol use: No   Drug use: No   Sexual activity: Yes  Other Topics Concern   Not on file  Social History Narrative   Not on file   Social Determinants of Health   Financial Resource Strain: Not on file  Food Insecurity: Not on file  Transportation Needs: Not on file  Physical Activity: Not on file  Stress: Not on file  Social Connections: Not on file    Tobacco Counseling Ready to quit: Not Answered Counseling given: Not Answered Tobacco comments: 1 PPD for 35 years   Clinical Intake:                 Diabetic?  Yes         Activities of Daily Living No flowsheet data found.  Patient Care Team: Fayrene Helper, MD as PCP - General  Indicate any recent Medical Services you may have received from other than [facility] providers in the past year (date may be approximate).     Assessment:   This is a routine wellness examination for Dakota Mendez.  Hearing/Vision screen No results found.  Dietary issues and exercise activities discussed:     Goals Addressed   None   Depression Screen PHQ 2/9 Scores 01/31/2021 10/18/2020 08/24/2020 04/01/2020 07/18/2019 04/01/2019 11/06/2018  PHQ - 2 Score 0 0 0 0 0 0 0  PHQ- 9 Score - - - 0 - - -    Fall Risk Fall Risk  01/31/2021 10/18/2020 04/01/2020 08/13/2019 07/18/2019  Falls in the past year? 0 0 1 0 0  Number falls in past yr: 0 0 0 0 0  Injury with Fall? 0 0 1 0 0  Risk for fall due to : - No Fall Risks History of fall(s);Impaired balance/gait - -  Follow up - Falls evaluation completed Education provided;Falls evaluation completed;Falls prevention discussed - -    FALL RISK PREVENTION PERTAINING TO THE HOME:  Any stairs in or around the home? Yes  If so, are there any without handrails? No  Home free of loose throw rugs in walkways, pet beds, electrical cords, etc? Yes  Adequate lighting in your home to reduce risk of falls? Yes   ASSISTIVE DEVICES UTILIZED TO PREVENT FALLS:  Life alert? No  Use of a cane, walker or w/c? No  Grab bars in the bathroom? No  Shower chair or bench in shower? No  Elevated toilet seat or a handicapped toilet? No   TIMED UP AND GO:  Was the test performed? No .  Length of time to ambulate 10 feet: NA sec.     Cognitive Function:     6CIT Screen 04/01/2020 04/01/2019 03/27/2018 01/08/2017  What Year? 0 points 0 points 0 points 0 points  What month? 0 points 0 points 0 points 0 points  What time? 0 points 0 points 0 points 0 points  Count back from 20 0 points 0 points 0 points 0 points  Months in reverse 0 points 0  points 0 points 0 points  Repeat phrase 0 points 0 points 0 points 0 points  Total Score 0 0 0 0    Immunizations Immunization History  Administered Date(s) Administered   Influenza Whole 10/11/2007, 05/17/2011   Influenza,inj,Quad  PF,6+ Mos 06/13/2013, 06/18/2014, 07/22/2015, 06/01/2016, 07/25/2017, 05/30/2018, 06/09/2019, 04/29/2020   Moderna Sars-Covid-2 Vaccination 12/05/2019, 01/06/2020, 07/26/2020, 01/28/2021   Pneumococcal Conjugate-13 12/15/2014   Pneumococcal Polysaccharide-23 04/05/2011, 06/01/2016   Td 09/21/2006    TDAP status: Due, Education has been provided regarding the importance of this vaccine. Advised may receive this vaccine at local pharmacy or Health Dept. Aware to provide a copy of the vaccination record if obtained from local pharmacy or Health Dept. Verbalized acceptance and understanding.  Flu Vaccine status: Up to date  Pneumococcal vaccine status: Up to date  Covid-19 vaccine status: Completed vaccines  Qualifies for Shingles Vaccine? Yes   Zostavax completed No   Shingrix Completed?: No.    Education has been provided regarding the importance of this vaccine. Patient has been advised to call insurance company to determine out of pocket expense if they have not yet received this vaccine. Advised may also receive vaccine at local pharmacy or Health Dept. Verbalized acceptance and understanding.  Screening Tests Health Maintenance  Topic Date Due   Zoster Vaccines- Shingrix (1 of 2) Never done   OPHTHALMOLOGY EXAM  04/05/2021   TETANUS/TDAP  08/23/2022 (Originally 09/21/2016)   INFLUENZA VACCINE  04/11/2021   FOOT EXAM  04/29/2021   HEMOGLOBIN A1C  08/03/2021   COLONOSCOPY (Pts 45-38yr Insurance coverage will need to be confirmed)  08/09/2027   Pneumococcal Vaccine 06265Years old (3 - PPSV23 or PCV20) 03/30/2028   PNEUMOCOCCAL POLYSACCHARIDE VACCINE AGE 84-64 HIGH RISK  Completed   COVID-19 Vaccine  Completed   Hepatitis C Screening  Completed   HIV  Screening  Completed   HPV VACCINES  Aged Out    Health Maintenance  Health Maintenance Due  Topic Date Due   Zoster Vaccines- Shingrix (1 of 2) Never done   OPHTHALMOLOGY EXAM  04/05/2021    Colorectal cancer screening: Type of screening: Colonoscopy. Completed 08-08-17. Repeat every 10 years  Lung Cancer Screening: (Low Dose CT Chest recommended if Age 58-80years, 30 pack-year currently smoking OR have quit w/in 15years.) does qualify.   Lung Cancer Screening Referral: Patient got billed after was told he would not get billed   Additional Screening:  Hepatitis C Screening: does qualify; Completed 11-27-15  Vision Screening: Recommended annual ophthalmology exams for early detection of glaucoma and other disorders of the eye. Is the patient up to date with their annual eye exam?  Yes  Who is the provider or what is the name of the office in which the patient attends annual eye exams? [facility]  If pt is not established with a provider, would they like to be referred to a provider to establish care? No .   Dental Screening: Recommended annual dental exams for proper oral hygiene  Community Resource Referral / Chronic Care Management: CRR required this visit?  No   CCM required this visit?  No      Plan:     I have personally reviewed and noted the following in the patient's chart:   Medical and social history Use of alcohol, tobacco or illicit drugs  Current medications and supplements including opioid prescriptions. Patient is not currently taking opioid prescriptions. Functional ability and status Nutritional status Physical activity Advanced directives List of other physicians Hospitalizations, surgeries, and ER visits in previous 12 months Vitals Screenings to include cognitive, depression, and falls Referrals and appointments  In addition, I have reviewed and discussed with patient certain preventive protocols, quality metrics, and best practice  recommendations. A written personalized  care plan for preventive services as well as general preventive health recommendations were provided to patient.     Shelda Altes, CMA   04/05/2021   Nurse Notes: Telehealth, Spent 40 mins talking to patient

## 2021-04-25 NOTE — Progress Notes (Signed)
I connected with  Dakota Mendez on 04/25/21 by an audio enabled telemedicine application and verified that I am speaking with the correct person using two identifiers.   I discussed the limitations, risks, security and privacy concerns of performing an evaluation and management service by telephone and the availability of in person appointments. I also discussed with the patient that there may be a patient responsible charge related to this service. The patient expressed understanding and verbally consented to this telephonic visit.  The patient was at home. The provider was Tula Nakayama MD who was in office. This was a telehealth visit.

## 2021-05-23 ENCOUNTER — Other Ambulatory Visit: Payer: Self-pay | Admitting: Family Medicine

## 2021-05-23 ENCOUNTER — Encounter: Payer: Medicare HMO | Admitting: Family Medicine

## 2021-05-27 ENCOUNTER — Encounter: Payer: Medicare HMO | Admitting: Family Medicine

## 2021-06-02 IMAGING — CT CT CHEST LUNG CANCER SCREENING LOW DOSE W/O CM
1 series · 10 of 10 positions shown, 13 images · non-contrast
Comparison: 06/19/2018

CLINICAL DATA: Forty pack-year smoking history. Current smoker.

EXAM:
CT CHEST WITHOUT CONTRAST LOW-DOSE FOR LUNG CANCER SCREENING
TECHNIQUE: Multidetector CT imaging of the chest was performed following the
standard protocol without IV contrast.

[ct lung segmentation data · axial · 0.75mm/px · z∈[+1150,+1150]mm · 10 of 298 frames shown]
[frame 1/298  mediastinal]
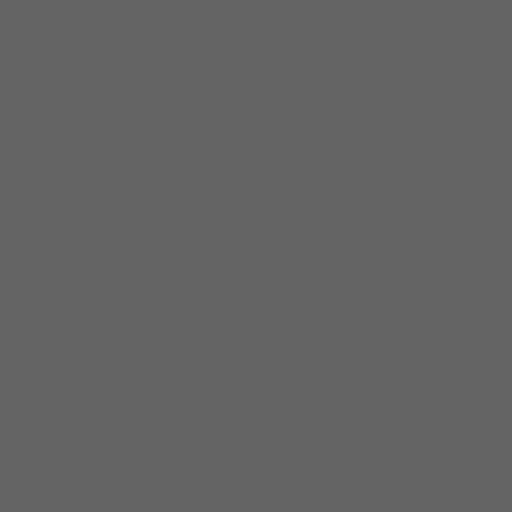
[frame 1/298  lung]
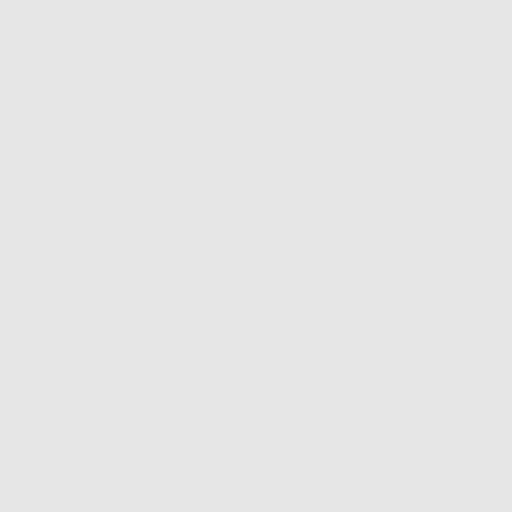
[frame 34/298  lung]
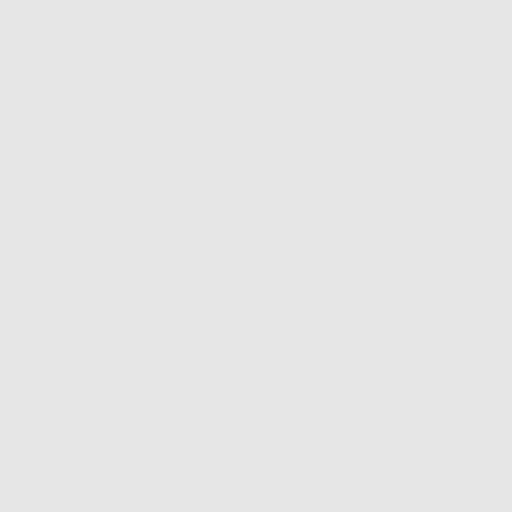
[frame 67/298  lung]
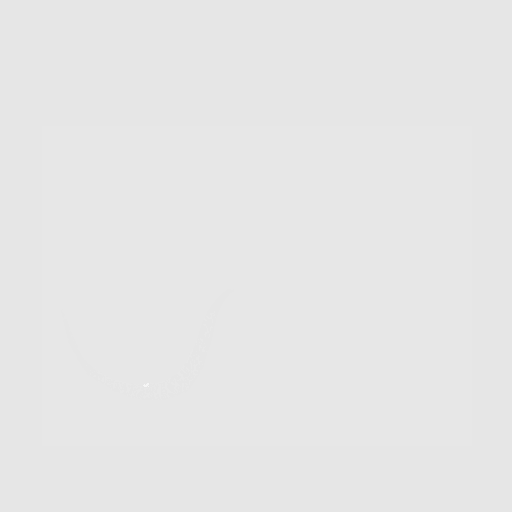
[frame 100/298  lung]
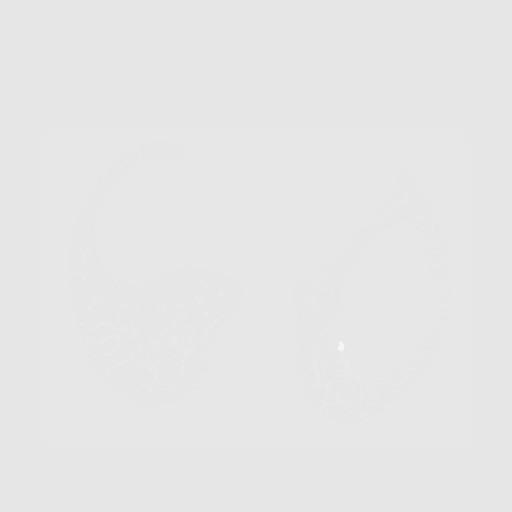
[frame 133/298  mediastinal]
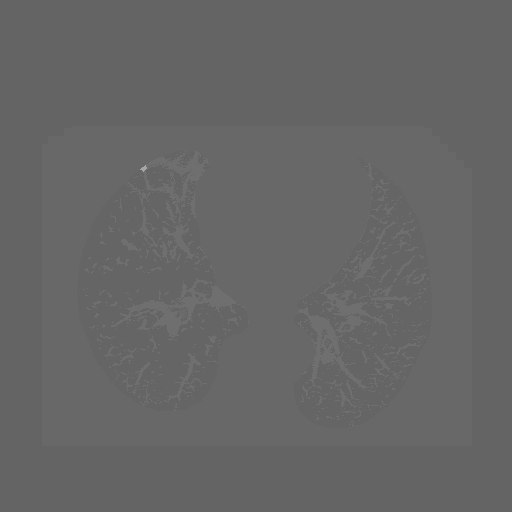
[frame 133/298  lung]
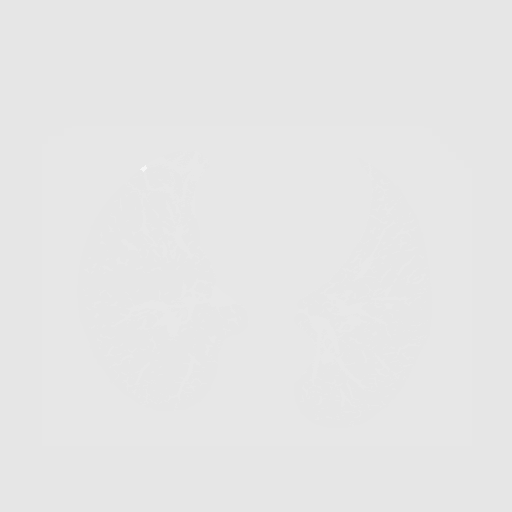
[frame 166/298  lung]
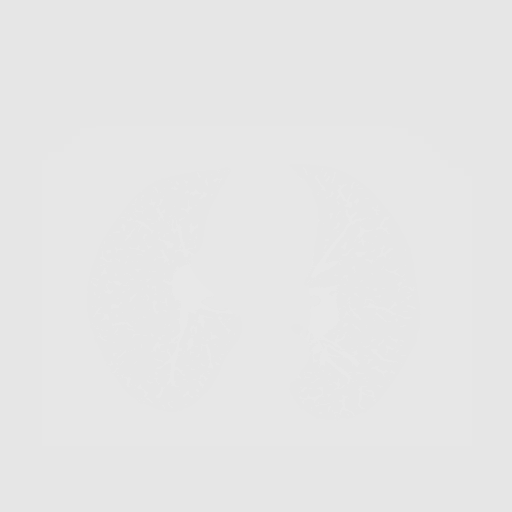
[frame 199/298  lung]
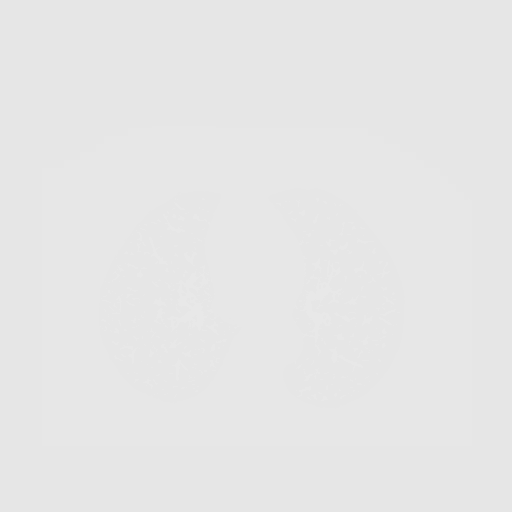
[frame 232/298  lung]
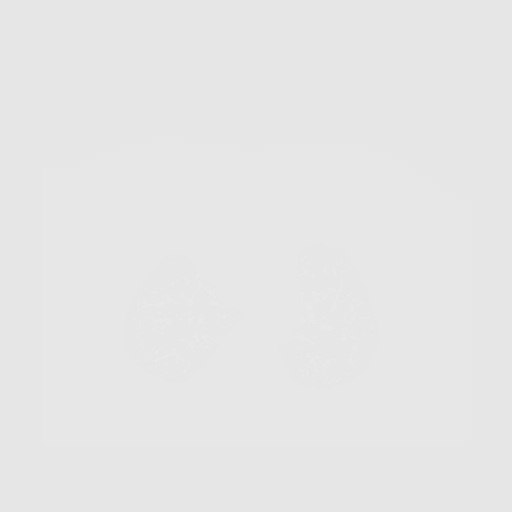
[frame 265/298  mediastinal]
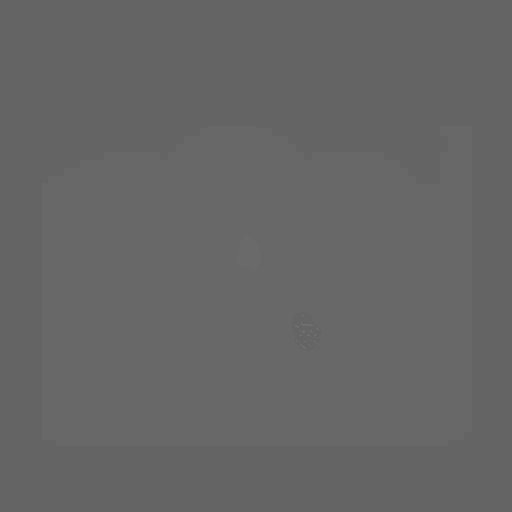
[frame 265/298  lung]
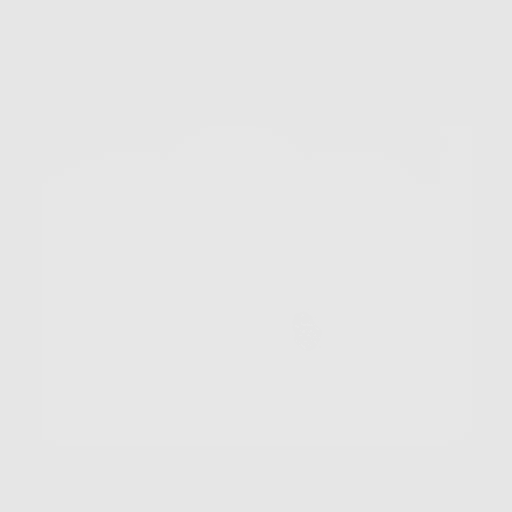
[frame 298/298  lung]
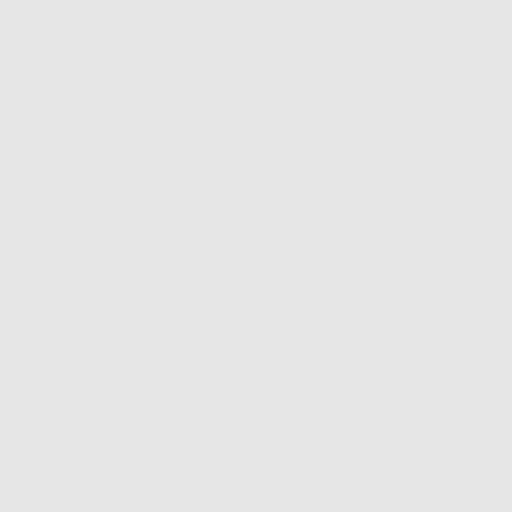

[10 of 10 positions shown; findings below may reference images not displayed]

FINDINGS: Cardiovascular: Tortuous thoracic aorta. Borderline cardiomegaly,
without pericardial effusion.

Mediastinum/Nodes: No mediastinal or definite hilar adenopathy,
given limitations of unenhanced CT.

Lungs/Pleura: No pleural fluid. Mild centrilobular emphysema.
Pulmonary nodules of maximally volume derived equivalent diameter
4.8 mm are not significantly changed.

Upper Abdomen: Normal imaged portions of the liver, spleen, stomach,
pancreas, adrenal glands, kidneys.

Musculoskeletal: Lower cervical spine fixation. Midthoracic
spondylosis.
IMPRESSION: Lung-RADS 2, benign appearance or behavior. Continue annual
screening with low-dose chest CT without contrast in 12 months.

Emphysema ([id number]).

## 2021-06-13 ENCOUNTER — Ambulatory Visit (INDEPENDENT_AMBULATORY_CARE_PROVIDER_SITE_OTHER): Payer: Medicare HMO

## 2021-06-13 ENCOUNTER — Other Ambulatory Visit: Payer: Self-pay

## 2021-06-13 DIAGNOSIS — Z23 Encounter for immunization: Secondary | ICD-10-CM

## 2021-07-21 ENCOUNTER — Other Ambulatory Visit: Payer: Self-pay

## 2021-07-21 DIAGNOSIS — E559 Vitamin D deficiency, unspecified: Secondary | ICD-10-CM | POA: Diagnosis not present

## 2021-07-21 DIAGNOSIS — I10 Essential (primary) hypertension: Secondary | ICD-10-CM

## 2021-07-21 DIAGNOSIS — E1159 Type 2 diabetes mellitus with other circulatory complications: Secondary | ICD-10-CM | POA: Diagnosis not present

## 2021-07-21 DIAGNOSIS — Z125 Encounter for screening for malignant neoplasm of prostate: Secondary | ICD-10-CM

## 2021-07-21 DIAGNOSIS — E785 Hyperlipidemia, unspecified: Secondary | ICD-10-CM

## 2021-07-22 ENCOUNTER — Other Ambulatory Visit: Payer: Self-pay

## 2021-07-22 ENCOUNTER — Ambulatory Visit (INDEPENDENT_AMBULATORY_CARE_PROVIDER_SITE_OTHER): Payer: Medicare HMO | Admitting: Family Medicine

## 2021-07-22 ENCOUNTER — Encounter: Payer: Self-pay | Admitting: Family Medicine

## 2021-07-22 VITALS — BP 130/84 | HR 98 | Resp 16 | Ht 72.0 in | Wt 276.0 lb

## 2021-07-22 DIAGNOSIS — Z0000 Encounter for general adult medical examination without abnormal findings: Secondary | ICD-10-CM

## 2021-07-22 DIAGNOSIS — Z720 Tobacco use: Secondary | ICD-10-CM

## 2021-07-22 DIAGNOSIS — E1159 Type 2 diabetes mellitus with other circulatory complications: Secondary | ICD-10-CM

## 2021-07-22 LAB — BMP8+EGFR
BUN/Creatinine Ratio: 17 (ref 9–20)
BUN: 17 mg/dL (ref 6–24)
CO2: 26 mmol/L (ref 20–29)
Calcium: 9.6 mg/dL (ref 8.7–10.2)
Chloride: 100 mmol/L (ref 96–106)
Creatinine, Ser: 1.03 mg/dL (ref 0.76–1.27)
Glucose: 144 mg/dL — ABNORMAL HIGH (ref 70–99)
Potassium: 4.2 mmol/L (ref 3.5–5.2)
Sodium: 139 mmol/L (ref 134–144)
eGFR: 84 mL/min/{1.73_m2} (ref >59)

## 2021-07-22 LAB — HEMOGLOBIN A1C
Est. average glucose Bld gHb Est-mCnc: 163 mg/dL
Hgb A1c MFr Bld: 7.3 % — ABNORMAL HIGH (ref 4.8–5.6)

## 2021-07-22 LAB — TSH: TSH: 2.75 u[IU]/mL (ref 0.450–4.500)

## 2021-07-22 LAB — PSA: Prostate Specific Ag, Serum: 1.3 ng/mL (ref 0.0–4.0)

## 2021-07-22 LAB — VITAMIN D 25 HYDROXY (VIT D DEFICIENCY, FRACTURES): Vit D, 25-Hydroxy: 36.3 ng/mL (ref 30.0–100.0)

## 2021-07-22 MED ORDER — EMPAGLIFLOZIN 10 MG PO TABS: 10.0000 mg | ORAL_TABLET | Freq: Every day | ORAL | 1 refills | Status: DC

## 2021-07-22 MED ORDER — METFORMIN HCL 1000 MG PO TABS: 1000.0000 mg | ORAL_TABLET | Freq: Every day | ORAL | 3 refills | Status: DC

## 2021-07-22 NOTE — Progress Notes (Signed)
Dakota Mendez     MRN: [id number]      DOB: 09-May-1961   HPI: Patient is in for annual physical exam. Recent labs, if available are reviewed.Dakota Mendez is added Encouraged and counseled to quit smoking, now at 10/day Immunization is reviewed , and  updated if needed.    PE; BP 130/84   Pulse 98   Resp 16   Ht 6' (1.829 m)   Wt 276 lb (125.2 kg)   SpO2 95%   BMI 37.43 kg/m   Pleasant male, alert and oriented x 3, in no cardio-pulmonary distress. Afebrile. HEENT No facial trauma or asymetry. Sinuses non tender. EOMI External ears normal,  Neck: supple, no adenopathy,JVD or thyromegaly.No bruits.  Chest: Clear to ascultation bilaterally.No crackles or wheezes. Non tender to palpation  Cardiovascular system; Heart sounds normal,  S1 and  S2 ,no S3.  No murmur, or thrill. Apical beat not displaced Peripheral pulses normal.  Abdomen: Soft, non tender, no organomegaly or masses. No bruits. Bowel sounds normal. No guarding, tenderness or rebound.    Musculoskeletal exam: Decreased though adequate  ROM of spine, hips , shoulders and knees. No deformity ,swelling or crepitus noted. No muscle wasting or atrophy.   Neurologic: Cranial nerves 2 to 12 intact. Power, tone ,sensation and reflexes normal throughout. No disturbance in gait. No tremor.  Skin: Intact, no ulceration, erythema , scaling or rash noted. Pigmentation normal throughout  Psych; Normal mood and affect. Judgement and concentration normal   Assessment & Plan:  Annual physical exam Annual exam as documented. Counseling done  re healthy lifestyle involving commitment to 150 minutes exercise per week, heart healthy diet, and attaining healthy weight.The importance of adequate sleep also discussed. Regular seat belt use and home safety, is also discussed. Changes in health habits are decided on by the patient with goals and time frames  set for achieving them. Immunization and cancer  screening needs are specifically addressed at this visit.   Tobacco abuse Asked:confirms currently smokes cigarettes 10/day Assess: Unwilling to set a quit date, but is cutting back Advise: needs to QUIT to reduce risk of cancer, cardio and cerebrovascular disease Assist: counseled for 5 minutes and literature provided Arrange: follow up in 2 to 4 months   Type 2 diabetes mellitus with vascular disease [facility]) Mr. Heslin is reminded of the importance of commitment to daily physical activity for 30 minutes or more, as able and the need to limit carbohydrate intake to 30 to 60 grams per meal to help with blood sugar control.   The need to take medication as prescribed, test blood sugar as directed, and to call between visits if there is a concern that blood sugar is uncontrolled is also discussed.   Mr. Trickett is reminded of the importance of daily foot exam, annual eye examination, and good blood sugar, blood pressure and cholesterol control.  Diabetic Labs Latest Ref Rng & Units 07/21/2021 01/31/2021 10/15/2020 08/24/2020 08/23/2020  HbA1c 4.8 - 5.6 % 7.3(H) 6.0(H) - 6.8 -  Microalbumin Not Estab. ug/mL - - - - -  Micro/Creat Ratio 0 - 29 mg/g creat - - - - 34(H)  Chol 100 - 199 mg/dL - - 165 - -  HDL >39 mg/dL - - 42 - -  Calc LDL 0 - 99 mg/dL - - 93 - -  Triglycerides 0 - 149 mg/dL - - 172(H) - -  Creatinine 0.76 - 1.27 mg/dL 1.03 1.05 1.04 - 0.95   BP/Weight 07/22/2021 01/31/2021  10/18/2020 08/24/2020 04/29/2020 04/01/2020 70/05/6282  Systolic BP 662 947 654 650 354 656 812  Diastolic BP 84 82 96 88 80 84 84  Wt. (Lbs) 276 277 286.4 288 296 291 291  BMI 37.43 37.57 38.84 39.06 40.14 39.47 39.47   Foot/eye exam completion dates Latest Ref Rng & Units 07/22/2021 04/29/2020  Eye Exam No Retinopathy - -  Foot exam Order - - -  Foot Form Completion - Done Done      Add jardiance daily

## 2021-07-22 NOTE — Patient Instructions (Addendum)
F/U in 14 weeks, call if you need me sooner  Please work on stopping smoking  New additional medication for diabetes is jardiance 10 mg once daily Comtinue metformin 1 daily and glipizide as before  Fasting lipid, cmp and eGFr and HBA1C in 13 weeks  Need covid vaccine  Need eye exam  It is important that you exercise regularly at least 30 minutes 5 times a week. If you develop chest pain, have severe difficulty breathing, or feel very tired, stop exercising immediately and seek medical attention    Thanks for choosing [facility], we consider it a privelige to serve you.  

## 2021-07-23 ENCOUNTER — Encounter: Payer: Self-pay | Admitting: Family Medicine

## 2021-07-23 NOTE — Assessment & Plan Note (Signed)

## 2021-07-23 NOTE — Assessment & Plan Note (Signed)
Asked:confirms currently smokes cigarettes 10/day Assess: Unwilling to set a quit date, but is cutting back Advise: needs to QUIT to reduce risk of cancer, cardio and cerebrovascular disease Assist: counseled for 5 minutes and literature provided Arrange: follow up in 2 to 4 months  

## 2021-07-23 NOTE — Assessment & Plan Note (Signed)
Dakota Mendez is reminded of the importance of commitment to daily physical activity for 30 minutes or more, as able and the need to limit carbohydrate intake to 30 to 60 grams per meal to help with blood sugar control.   The need to take medication as prescribed, test blood sugar as directed, and to call between visits if there is a concern that blood sugar is uncontrolled is also discussed.   Dakota Mendez is reminded of the importance of daily foot exam, annual eye examination, and good blood sugar, blood pressure and cholesterol control.  Diabetic Labs Latest Ref Rng & Units 07/21/2021 01/31/2021 10/15/2020 08/24/2020 08/23/2020  HbA1c 4.8 - 5.6 % 7.3(H) 6.0(H) - 6.8 -  Microalbumin Not Estab. ug/mL - - - - -  Micro/Creat Ratio 0 - 29 mg/g creat - - - - 34(H)  Chol 100 - 199 mg/dL - - 165 - -  HDL >39 mg/dL - - 42 - -  Calc LDL 0 - 99 mg/dL - - 93 - -  Triglycerides 0 - 149 mg/dL - - 172(H) - -  Creatinine 0.76 - 1.27 mg/dL 1.03 1.05 1.04 - 0.95   BP/Weight 07/22/2021 01/31/2021 10/18/2020 08/24/2020 04/29/2020 04/01/2020 33/12/3566  Systolic BP 616 837 290 211 155 208 022  Diastolic BP 84 82 96 88 80 84 84  Wt. (Lbs) 276 277 286.4 288 296 291 291  BMI 37.43 37.57 38.84 39.06 40.14 39.47 39.47   Foot/eye exam completion dates Latest Ref Rng & Units 07/22/2021 04/29/2020  Eye Exam No Retinopathy - -  Foot exam Order - - -  Foot Form Completion - Done Done      Add jardiance daily

## 2021-07-26 ENCOUNTER — Telehealth: Payer: Medicare HMO | Admitting: Family Medicine

## 2021-07-26 NOTE — Telephone Encounter (Signed)
Pt states that Dakota Mendez will cost him $125 would you like me to give him samples for a month or do you think he needs something else prescribed for his sugars.

## 2021-07-26 NOTE — Telephone Encounter (Signed)
Pt called in with questions on Jardience   Pt states there is a $125 copay and he shouldn't have a copay .  Pt wants to discuss

## 2021-07-29 ENCOUNTER — Other Ambulatory Visit: Payer: Self-pay

## 2021-07-29 DIAGNOSIS — E1159 Type 2 diabetes mellitus with other circulatory complications: Secondary | ICD-10-CM

## 2021-08-02 ENCOUNTER — Other Ambulatory Visit: Payer: Self-pay

## 2021-08-02 DIAGNOSIS — E1159 Type 2 diabetes mellitus with other circulatory complications: Secondary | ICD-10-CM

## 2021-08-02 NOTE — Telephone Encounter (Signed)
Gerald Stabs the pharmacist will help with this

## 2021-08-03 ENCOUNTER — Telehealth (HOSPITAL_COMMUNITY): Payer: Self-pay | Admitting: Physician Assistant

## 2021-08-03 ENCOUNTER — Telehealth: Payer: Self-pay | Admitting: *Deleted

## 2021-08-03 ENCOUNTER — Other Ambulatory Visit: Payer: Self-pay | Admitting: Family Medicine

## 2021-08-03 NOTE — Chronic Care Management (AMB) (Signed)
  Chronic Care Management   Note  08/03/2021 Name: Dakota Mendez MRN: [id number] DOB: 02-Aug-1961  Dakota Mendez is a 60 y.o. year old male who is a primary care patient of Fayrene Helper, MD. I reached out to Hollace Kinnier by phone today in response to a referral sent by Dakota Mendez [facility] PCP.  Dakota Mendez was given information about Chronic Care Management services today including:  CCM service includes personalized support from designated clinical staff supervised by his physician, including individualized plan of care and coordination with other care providers 24/7 contact phone numbers for assistance for urgent and routine care needs. Service will only be billed when office clinical staff spend 20 minutes or more in a month to coordinate care. Only one practitioner may furnish and bill the service in a calendar month. The patient may stop CCM services at any time (effective at the end of the month) by phone call to the office staff. The patient is responsible for co-pay (up to 20% after annual deductible is met) if co-pay is required by the individual health plan.   Patient agreed to services and verbal consent obtained.   Follow up plan: Face to Face appointment with care management team member scheduled for: 08/10/21  [place] [facility]  Direct Dial: [phone]

## 2021-08-08 ENCOUNTER — Ambulatory Visit (HOSPITAL_COMMUNITY): Admission: RE | Admit: 2021-08-08 | Payer: Medicare HMO | Source: Ambulatory Visit

## 2021-08-10 ENCOUNTER — Ambulatory Visit (INDEPENDENT_AMBULATORY_CARE_PROVIDER_SITE_OTHER): Payer: Medicare HMO | Admitting: Pharmacist

## 2021-08-10 ENCOUNTER — Other Ambulatory Visit: Payer: Self-pay

## 2021-08-10 ENCOUNTER — Other Ambulatory Visit: Payer: Self-pay | Admitting: Family Medicine

## 2021-08-10 ENCOUNTER — Other Ambulatory Visit: Payer: Self-pay | Admitting: *Deleted

## 2021-08-10 VITALS — BP 123/85 | HR 94

## 2021-08-10 DIAGNOSIS — Z7984 Long term (current) use of oral hypoglycemic drugs: Secondary | ICD-10-CM | POA: Diagnosis not present

## 2021-08-10 DIAGNOSIS — I10 Essential (primary) hypertension: Secondary | ICD-10-CM | POA: Diagnosis not present

## 2021-08-10 DIAGNOSIS — F17210 Nicotine dependence, cigarettes, uncomplicated: Secondary | ICD-10-CM | POA: Diagnosis not present

## 2021-08-10 DIAGNOSIS — E1159 Type 2 diabetes mellitus with other circulatory complications: Secondary | ICD-10-CM | POA: Diagnosis not present

## 2021-08-10 DIAGNOSIS — E785 Hyperlipidemia, unspecified: Secondary | ICD-10-CM

## 2021-08-10 MED ORDER — EMPAGLIFLOZIN 10 MG PO TABS: 10.0000 mg | ORAL_TABLET | Freq: Every day | ORAL | 0 refills | Status: DC

## 2021-08-10 NOTE — Chronic Care Management (AMB) (Signed)
Chronic Care Management Pharmacy Note  08/10/2021 Name:  Dakota Mendez MRN:  [id number] DOB:  1961/03/26  Summary:  Type 2 Diabetes Patient has not been able to start Jardiance (unable to afford copay) Patient instructed to start checking blood glucose once daily after initiation of Jardiance. Patient verbalizes understanding. Discussed management of hypoglycemia. If blood sugar <70 at any time, treat with simple sugar such as 1/2 cup juice or regular soda or 3-4 glucose tablets. Recheck blood sugar in 15 minutes and repeat if blood sugar remains <70.  Continue current medications as prescribed by PCP. Will have low threshold to discontinue glipizide due to risk of hypoglycemia. Instructed patient to let me know if hypoglycemia occurs.  Patient does not think he will qualify for patient assistance due to income. Sample of 28 tablets given in office today to get patient through end of year. Patient endorses that he can afford the copay of $125 for 3 month supply in 2023. Will have patient get BMP 2-4 weeks after initiation of therapy. Patient in agreement.   Hypertension: Unclear why only prescribed clonidine once daily as this has a short half life and is now helping lower blood pressure but for a short period of time each day  Hyperlipidemia Uncontrolled. LDL above goal of <70 due to very high risk given 10-year risk >20% per 2020 AACE/ACE guidelines. Triglycerides above goal of <150 per 2020 AACE/ACE guidelines. Current medications: pravastatin 80 mg by mouth once daily Consider increasing to high intensity statin or add ezetimibe 10 mg by mouth daily. Will discuss more at future visits.  Subjective: Dakota Mendez is an 60 y.o. year old male who is a primary patient of Moshe Cipro, [place] Levo, MD.  The CCM team was consulted for assistance with disease management and care coordination needs.    Engaged with patient face to face for initial visit in response to provider referral for  pharmacy case management and/or care coordination services.   Consent to Services:  The patient was given the following information about Chronic Care Management services today, agreed to services, and gave verbal consent: 1. CCM service includes personalized support from designated clinical staff supervised by the primary care provider, including individualized plan of care and coordination with other care providers 2. 24/7 contact phone numbers for assistance for urgent and routine care needs. 3. Service will only be billed when office clinical staff spend 20 minutes or more in a month to coordinate care. 4. Only one practitioner may furnish and bill the service in a calendar month. 5.The patient may stop CCM services at any time (effective at the end of the month) by phone call to the office staff. 6. The patient will be responsible for cost sharing (co-pay) of up to 20% of the service fee (after annual deductible is met). Patient agreed to services and consent obtained.  Patient Care Team: Fayrene Helper, MD as PCP - General Beryle Lathe, [facility] (Pharmacist)  Objective:  Lab Results  Component Value Date   CREATININE 1.03 07/21/2021   CREATININE 1.05 01/31/2021   CREATININE 1.04 10/15/2020    Lab Results  Component Value Date   HGBA1C 7.3 (H) 07/21/2021   Last diabetic Eye exam:  Lab Results  Component Value Date/Time   HMDIABEYEEXA No Retinopathy 04/05/2020 12:00 AM    Last diabetic Foot exam:  Lab Results  Component Value Date/Time   HMDIABFOOTEX yes 02/18/2010 12:00 AM        Component Value Date/Time  CHOL 165 10/15/2020 0805   TRIG 172 (H) 10/15/2020 0805   HDL 42 10/15/2020 0805   CHOLHDL 3.9 10/15/2020 0805   CHOLHDL 4.5 08/12/2019 0736   VLDL 63 (H) 02/13/2017 0720   LDLCALC 93 10/15/2020 0805   LDLCALC 115 (H) 08/12/2019 0736    Hepatic Function Latest Ref Rng & Units 10/15/2020 04/28/2020 08/12/2019  Total Protein 6.0 - 8.5 g/dL 6.9 6.9 6.9   Albumin 3.8 - 4.9 g/dL 4.3 4.1 -  AST 0 - 40 IU/L _0 ALT 0 - 44 IU/L _1 Alk Phosphatase 44 - 121 IU/L 75 80 -  Total Bilirubin 0.0 - 1.2 mg/dL 0.7 0.6 0.8  Bilirubin, Direct 0.0 - 0.3 mg/dL - - -    Lab Results  Component Value Date/Time   TSH 2.750 07/21/2021 08:27 AM   TSH 2.850 01/31/2021 09:53 AM    CBC Latest Ref Rng & Units 08/23/2020 08/12/2019 05/31/2018  WBC 3.4 - 10.8 x10E3/uL 12.1(H) 12.2(H) 11.5(H)  Hemoglobin 13.0 - 17.7 g/dL 13.6 14.1 14.6  Hematocrit 37.5 - 51.0 % 40.8 42.1 43.9  Platelets 150 - 450 x10E3/uL 393 370 379    Lab Results  Component Value Date/Time   VD25OH 36.3 07/21/2021 08:27 AM   VD25OH 43.0 01/31/2021 09:53 AM    Clinical ASCVD: No  The 10-year ASCVD risk score (Arnett DK, et al., 2019) is: 33.2%   Values used to calculate the score:     Age: 60 years     Sex: Male     Is Non-Hispanic African American: Yes     Diabetic: Yes     Tobacco smoker: Yes     Systolic Blood Pressure: 588 mmHg     Is BP treated: Yes     HDL Cholesterol: 42 mg/dL     Total Cholesterol: 165 mg/dL    Social History   Tobacco Use  Smoking Status Every Day   Packs/day: 0.50   Years: 40.00   Pack years: 20.00   Types: Cigarettes  Smokeless Tobacco Never  Tobacco Comments   1 PPD for 35 years   BP Readings from Last 3 Encounters:  08/10/21 123/85  07/22/21 130/84  01/31/21 120/82   Pulse Readings from Last 3 Encounters:  08/10/21 94  07/22/21 98  01/31/21 100   Wt Readings from Last 3 Encounters:  07/22/21 276 lb (125.2 kg)  01/31/21 277 lb (125.6 kg)  10/18/20 286 lb 6.4 oz (129.9 kg)    Assessment: Review of patient past medical history, allergies, medications, health status, including review of consultants reports, laboratory and other test data, was performed as part of comprehensive evaluation and provision of chronic care management services.   SDOH:  (Social Determinants of Health) assessments and interventions performed:     CCM Care Plan  No Known Allergies  Medications Reviewed Today     Reviewed by Beryle Lathe, [facility] (Pharmacist) on 08/10/21 at 1511  Med List Status: <None>   Medication Order Taking? Sig Documenting Provider Last Dose Status Informant  Alcohol Swabs PADS [id number]  1 each by Does not apply route as directed. Fayrene Helper, MD  Active   amLODipine (NORVASC) 10 MG tablet [id number] Yes TAKE 1 TABLET EVERY DAY Fayrene Helper, MD Taking Active   Blood Glucose Monitoring Suppl (TRUE METRIX METER) w/Device KIT [id number]  USE AS DIRECTED Fayrene Helper, MD  Active   cloNIDine (CATAPRES) 0.3 MG tablet [id number] Yes TAKE 1 TABLET EVERY DAY  Fayrene Helper, MD Taking Active   empagliflozin (JARDIANCE) 10 MG TABS tablet [id number] No Take 1 tablet (10 mg total) by mouth daily.  Patient not taking: Reported on 08/10/2021   Fayrene Helper, MD Not Taking Active   glipiZIDE (GLUCOTROL XL) 10 MG 24 hr tablet [id number] Yes TAKE 1 TABLET EVERY DAY WITH BREAKFAST Fayrene Helper, MD Taking Active   glucose blood (ONE TOUCH ULTRA TEST) test strip [id number]  Use as instructed for once daily testing  Dx 250.00 length of need- 99 months Fayrene Helper, MD  Active Self  ibuprofen (IBU) 800 MG tablet [id number] Yes TAKE 1 TABLET TWICE DAILY AS NEEDED FOR PAIN Fayrene Helper, MD Taking Active            Med Note Vernell Barrier Aug 10, 2021  3:07 PM) Rarely takes  losartan-hydrochlorothiazide (HYZAAR) 100-25 MG tablet [id number] Yes TAKE 1 TABLET EVERY DAY Fayrene Helper, MD Taking Active   metFORMIN (GLUCOPHAGE) 1000 MG tablet [id number] Yes Take 1 tablet (1,000 mg total) by mouth daily with breakfast. Fayrene Helper, MD Taking Active   potassium chloride (KLOR-CON) 10 MEQ tablet [id number] Yes TAKE 1 TABLET EVERY DAY Fayrene Helper, MD Taking Active   pravastatin (PRAVACHOL) 80 MG tablet [id number] Yes TAKE 1 TABLET EVERY DAY Fayrene Helper, MD Taking Active   TRUEplus Lancets 33G [place] [id number]  1 each by Does not apply route as directed. Fayrene Helper, MD  Active             Patient Active Problem List   Diagnosis Date Noted   Tobacco abuse 01/30/2020   Special screening for malignant neoplasm of intestine 03/29/2017   Annual physical exam 06/01/2016   Obstructive sleep apnea 04/18/2013   Type 2 diabetes mellitus with vascular disease ([employer]) 11/10/2009   Erectile dysfunction due to diseases classified elsewhere 11/10/2009   Hyperlipidemia LDL goal <100 02/11/2008   Morbid obesity ([place]) 02/11/2008   Essential hypertension 02/11/2008    Immunization History  Administered Date(s) Administered   Influenza Whole 10/11/2007, 05/17/2011   Influenza,inj,Quad PF,6+ Mos 06/13/2013, 06/18/2014, 07/22/2015, 06/01/2016, 07/25/2017, 05/30/2018, 06/09/2019, 04/29/2020, 06/13/2021   Moderna Sars-Covid-2 Vaccination 12/05/2019, 01/06/2020, 07/26/2020, 01/28/2021   Pneumococcal Conjugate-13 12/15/2014   Pneumococcal Polysaccharide-23 04/05/2011, 06/01/2016   Td 09/21/2006    Conditions to be addressed/monitored: HTN, HLD, and DMII  Care Plan : Medication Management  Updates made by Beryle Lathe, [place] since 08/10/2021 12:00 AM     Problem: T2DM, HTN, HLD   Priority: High  Onset Date: 08/10/2021     Long-Range Goal: Disease Progression Prevention   Start Date: 08/10/2021  Expected End Date: 11/08/2021  This Visit's Progress: On track  Priority: High  Note:   Current Barriers:  Unable to independently afford treatment regimen Unable to independently monitor therapeutic efficacy Unable to achieve control of diabetes and hyperlipidemia  Pharmacist Clinical Goal(s):  Patient will Verbalize ability to afford treatment regimen Achieve adherence to monitoring guidelines and medication adherence to achieve therapeutic efficacy Achieve control of diabetes and hyperlipidemia as evidenced by improved  fasting blood sugar, improved A1c, improved LDL, and improved triglycerides through collaboration with PharmD and provider.   Interventions: 1:1 collaboration with Fayrene Helper, MD regarding development and update of comprehensive plan of care as evidenced by provider attestation and co-signature Inter-disciplinary care team collaboration (see longitudinal plan of care) Comprehensive medication review performed; medication list updated in electronic medical record  Type  2 Diabetes - New goal.: Uncontrolled; Most recent A1c above goal of <7% per ADA guidelines Current medications: metformin 1,000 mg by mouth once daily, empagliflozin (Jardiance) 10 mg by mouth once daily, and glipizide XL 10 mg by mouth with breakfast Intolerances: none Taking medications as directed: no, has not been able to start Jardiance (unable to afford copay) Side effects thought to be attributed to current medication regimen: no Denies hypoglycemic symptoms (sweaty and shaky). Hypoglycemia prevention: not indicated at this time Current meal patterns: not discussed today Current exercise: not discussed today On a statin: yes On aspirin 81 mg daily: no Last microalbumin/creatinine ratio: 34 (08/23/20); on an ACEi/ARB: yes Current glucose readings: fasting blood glucose: within goal range of 80-130 mg/dL per ADA guidelines. But patient reports he has not checked in a while. Patient instructed to start checking blood glucose once daily after initiation of Jardiance. Patient verbalizes understanding. Discussed management of hypoglycemia. If blood sugar <70 at any time, treat with simple sugar such as 1/2 cup juice or regular soda or 3-4 glucose tablets. Recheck blood sugar in 15 minutes and repeat if blood sugar remains <70.  Continue current medications as above per PCP. Will have low threshold to discontinue glipizide due to risk of hypoglycemia. Instructed patient to let me know if hypoglycemia occurs.  Patient  does not think he will qualify for patient assistance due to income. Sample of 28 tablets given in office today to get patient through end of year. Patient endorses that he can afford the copay of $125 for 3 month supply in 2023. Will have patient get BMP 2-4 weeks after initiation of therapy. Patient in agreement.   Hypertension - New goal.: Blood pressure under good control. Blood pressure is at goal of <130/80 mmHg per 2017 AHA/ACC guidelines. Current medications: amlodipine 10 mg by mouth once daily, losartan-HCTZ 100-2m by mouth once daily, and clonidine 0.3 mg by mouth once daily Intolerances: none Taking medications as directed: yes Side effects thought to be attributed to current medication regimen: no Current home blood pressure: not discussed today  Continue current medications as above If further blood pressure lowering needed, consider addition of hydralazine twice daily or three time daily Unclear why only prescribed clonidine once daily as this has a short half life and is now helping lower blood pressure but for a short period of time each day  Hyperlipidemia - New goal.: Uncontrolled. LDL above goal of <70 due to very high risk given 10-year risk >20% per 2020 AACE/ACE guidelines. Triglycerides above goal of <150 per 2020 AACE/ACE guidelines. Current medications: pravastatin 80 mg by mouth once daily Intolerances: none Taking medications as directed: yes Side effects thought to be attributed to current medication regimen: no Encourage dietary reduction of high fat containing foods such as butter, nuts, bacon, egg yolks, etc. Recommend regular aerobic exercise Discussed need for and importance of continued work on weight loss Reviewed risks of hyperlipidemia, principles of treatment and consequences of untreated hyperlipidemia Patient counseled on smoking cessation Re-check lipid panel in 4-12 weeks Consider increasing to high intensity statin or add ezetimibe 10 mg by  mouth daily. Will discuss more at future visits.  Patient Goals/Self-Care Activities Patient will:  Take medications as prescribed Check blood sugar as directed by PCP, document, and provide at future appointments Check blood pressure at least once daily, document, and provide at future appointments Collaborate with provider on medication access solutions  Follow Up Plan: Telephone follow up appointment with care management team member scheduled  for: 09/07/21      Medication Assistance: None required.  Patient affirms current coverage meets needs.  Patient's preferred pharmacy is:  [facility] [address] [place] - [place] [address] [place] [address] Phone: [phone] Fax: [phone]  [facility] - [place], [place] [address] Phone: [phone] Fax: [phone]  Follow Up:  Patient agrees to Care Plan and Follow-up.  Plan: Telephone follow up appointment with care management team member scheduled for:  09/07/21  Kennon Holter, PharmD, [facility] Clinical Pharmacist [facility] [phone]

## 2021-08-10 NOTE — Patient Instructions (Signed)
Dakota Mendez,  It was great to talk to you today!  Please call me with any questions or concerns.   Visit Information   Following is a copy of your full care plan:  Care Plan : Medication Management  Updates made by Beryle Lathe, [place] since 08/10/2021 12:00 AM     Problem: T2DM, HTN, HLD   Priority: High  Onset Date: 08/10/2021     Long-Range Goal: Disease Progression Prevention   Start Date: 08/10/2021  Expected End Date: 11/08/2021  This Visit's Progress: On track  Priority: High  Note:   Current Barriers:  Unable to independently afford treatment regimen Unable to independently monitor therapeutic efficacy Unable to achieve control of diabetes and hyperlipidemia  Pharmacist Clinical Goal(s):  Patient will Verbalize ability to afford treatment regimen Achieve adherence to monitoring guidelines and medication adherence to achieve therapeutic efficacy Achieve control of diabetes and hyperlipidemia as evidenced by improved fasting blood sugar, improved A1c, improved LDL, and improved triglycerides through collaboration with PharmD and provider.   Interventions: 1:1 collaboration with Fayrene Helper, MD regarding development and update of comprehensive plan of care as evidenced by provider attestation and co-signature Inter-disciplinary care team collaboration (see longitudinal plan of care) Comprehensive medication review performed; medication list updated in electronic medical record  Type 2 Diabetes - New goal.: Uncontrolled; Most recent A1c above goal of <7% per ADA guidelines Current medications: metformin 1,000 mg by mouth once daily, empagliflozin (Jardiance) 10 mg by mouth once daily, and glipizide XL 10 mg by mouth with breakfast Intolerances: none Taking medications as directed: no, has not been able to start Jardiance (unable to afford copay) Side effects thought to be attributed to current medication regimen: no Denies hypoglycemic symptoms  (sweaty and shaky). Hypoglycemia prevention: not indicated at this time Current meal patterns: not discussed today Current exercise: not discussed today On a statin: yes On aspirin 81 mg daily: no Last microalbumin/creatinine ratio: 34 (08/23/20); on an ACEi/ARB: yes Current glucose readings: fasting blood glucose: within goal range of 80-130 mg/dL per ADA guidelines. But patient reports he has not checked in a while. Patient instructed to start checking blood glucose once daily after initiation of Jardiance. Patient verbalizes understanding. Discussed management of hypoglycemia. If blood sugar <70 at any time, treat with simple sugar such as 1/2 cup juice or regular soda or 3-4 glucose tablets. Recheck blood sugar in 15 minutes and repeat if blood sugar remains <70.  Continue current medications as above per PCP. Will have low threshold to discontinue glipizide due to risk of hypoglycemia. Instructed patient to let me know if hypoglycemia occurs.  Patient does not qualify for patient assistance. Sample of 28 tablets given in office today to get patient through until the next year and patient endorses that he can afford the copay of $125 for 3 month supply.  Will have patient get BMP 2-4 weeks after initiation of therapy  Hypertension - New goal.: Blood pressure under good control. Blood pressure is at goal of <130/80 mmHg per 2017 AHA/ACC guidelines. Current medications: amlodipine 10 mg by mouth once daily, losartan-HCTZ 100-66m by mouth once daily, and clonidine 0.3 mg by mouth once daily Intolerances: none Taking medications as directed: yes Side effects thought to be attributed to current medication regimen: no Current home blood pressure: not discussed today  Continue current medications as above If further blood pressure lowering needed, consider addition of hydralazine twice daily or three time daily Unclear why only prescribed clonidine once daily as  this has a short half life and is  now helping lower blood pressure but for a short period of time each day  Hyperlipidemia - New goal.: Uncontrolled. LDL above goal of <70 due to very high risk given 10-year risk >20% per 2020 AACE/ACE guidelines. Triglycerides above goal of <150 per 2020 AACE/ACE guidelines. Current medications: pravastatin 80 mg by mouth once daily Intolerances: none Taking medications as directed: yes Side effects thought to be attributed to current medication regimen: no Encourage dietary reduction of high fat containing foods such as butter, nuts, bacon, egg yolks, etc. Recommend regular aerobic exercise Discussed need for and importance of continued work on weight loss Reviewed risks of hyperlipidemia, principles of treatment and consequences of untreated hyperlipidemia Patient counseled on smoking cessation Re-check lipid panel in 4-12 weeks Consider increasing to high intensity statin or add ezetimibe 10 mg by mouth daily. Will discuss more at future visits.  Patient Goals/Self-Care Activities Patient will:  Take medications as prescribed Check blood sugar as directed by PCP, document, and provide at future appointments Check blood pressure at least once daily, document, and provide at future appointments Collaborate with provider on medication access solutions  Follow Up Plan: Telephone follow up appointment with care management team member scheduled for: 09/07/21      Consent to CCM Services: Dakota Mendez was given information about Chronic Care Management services including:  CCM service includes personalized support from designated clinical staff supervised by his physician, including individualized plan of care and coordination with other care providers 24/7 contact phone numbers for assistance for urgent and routine care needs. Service will only be billed when office clinical staff spend 20 minutes or more in a month to coordinate care. Only one practitioner may furnish and bill the  service in a calendar month. The patient may stop CCM services at any time (effective at the end of the month) by phone call to the office staff. The patient will be responsible for cost sharing (co-pay) of up to 20% of the service fee (after annual deductible is met).  Patient agreed to services and verbal consent obtained.   Plan: Face to Face appointment with care management team member scheduled for: 09/07/21  Kennon Holter, PharmD, [facility] Clinical Pharmacist [facility] [phone]   Please call the care guide team at [phone] if you need to cancel or reschedule your appointment.   Print copy of patient instructions, educational materials, and care plan provided in person.

## 2021-08-25 ENCOUNTER — Ambulatory Visit: Payer: Medicare HMO | Admitting: Pharmacist

## 2021-08-25 DIAGNOSIS — E785 Hyperlipidemia, unspecified: Secondary | ICD-10-CM

## 2021-08-25 DIAGNOSIS — I10 Essential (primary) hypertension: Secondary | ICD-10-CM

## 2021-08-25 DIAGNOSIS — E1159 Type 2 diabetes mellitus with other circulatory complications: Secondary | ICD-10-CM

## 2021-08-25 NOTE — Chronic Care Management (AMB) (Signed)
Chronic Care Management Pharmacy Note  08/25/2021 Name:  Dakota Mendez MRN:  [id number] DOB:  [birth year]  Summary: Type 2 Diabetes Current medications: metformin 1,000 mg by mouth once daily, empagliflozin (Jardiance) 10 mg by mouth once daily, and glipizide XL 10 mg by mouth with breakfast Reports hypoglycemic symptoms (shaky/jittery) this AM with a blood glucose of 71 a couple hours after taking glipizide. Patient reports he is treating with peppermint candy now and is feeling better. He reports that he will make sure blood glucose increases soon.  Since this is first incidence of hypoglycemia since starting Jardiance according to the patient, instructed patient to continue current regimen for now but to discontinue glipizide if another hypoglycemic event occurs. Will continue Jardiance and metformin. May need to increase metformin back to 1,000 mg by mouth twice daily if more glycemic control needed. Will discuss more at next visit.  Will have patient get BMP 2-4 weeks after initiation of therapy. Patient in agreement.   Subjective: Dakota Mendez is an 60 y.o. year old male who is a primary patient of Moshe Cipro, [place] Levo, MD.  The CCM team was consulted for assistance with disease management and care coordination needs.    Engaged with patient by telephone for follow up visit in response to provider referral for pharmacy case management and/or care coordination services.   Consent to Services:  The patient was given information about Chronic Care Management services, agreed to services, and gave verbal consent prior to initiation of services.  Please see initial visit note for detailed documentation.   Patient Care Team: Fayrene Helper, MD as PCP - General Beryle Lathe, [facility] (Pharmacist)  Objective:  Lab Results  Component Value Date   CREATININE 1.03 07/21/2021   CREATININE 1.05 01/31/2021   CREATININE 1.04 10/15/2020    Lab Results  Component Value Date    HGBA1C 7.3 (H) 07/21/2021   Last diabetic Eye exam:  Lab Results  Component Value Date/Time   HMDIABEYEEXA No Retinopathy 58/26/2021 12:00 AM    Last diabetic Foot exam:  Lab Results  Component Value Date/Time   HMDIABFOOTEX yes 02/18/2010 12:00 AM        Component Value Date/Time   CHOL 165 10/15/2020 0805   TRIG 172 (H) 10/15/2020 0805   HDL 42 10/15/2020 0805   CHOLHDL 3.9 10/15/2020 0805   CHOLHDL 4.5 08/12/2019 0736   VLDL 63 (H) 02/13/2017 0720   LDLCALC 93 10/15/2020 0805   LDLCALC 115 (H) 08/12/2019 0736    Hepatic Function Latest Ref Rng & Units 10/15/2020 04/28/2020 08/12/2019  Total Protein 6.0 - 8.5 g/dL 6.9 6.9 6.9  Albumin 3.8 - 4.9 g/dL 4.3 4.1 -  AST 0 - 40 IU/L $Remov'13 10 10  'ETnmmV$ ALT 0 - 44 IU/L $Remov'10 13 10  'iRrbOZ$ Alk Phosphatase 44 - 121 IU/L 75 80 -  Total Bilirubin 0.0 - 1.2 mg/dL 0.7 0.6 0.8  Bilirubin, Direct 0.0 - 0.3 mg/dL - - -    Lab Results  Component Value Date/Time   TSH 2.750 07/21/2021 08:27 AM   TSH 2.850 01/31/2021 09:53 AM    CBC Latest Ref Rng & Units 08/23/2020 08/12/2019 05/31/2018  WBC 3.4 - 10.8 x10E3/uL 12.1(H) 12.2(H) 11.5(H)  Hemoglobin 13.0 - 17.7 g/dL 13.6 14.1 14.6  Hematocrit 37.5 - 51.0 % 40.8 42.1 43.9  Platelets 150 - 450 x10E3/uL 393 370 379    Lab Results  Component Value Date/Time   VD25OH 36.3 07/21/2021 08:27 AM   VD25OH  43.0 01/31/2021 09:53 AM    Clinical ASCVD: No  The 10-year ASCVD risk score (Arnett DK, et al., 2019) is: 33.2%   Values used to calculate the score:     Age: 60 years     Sex: Male     Is Non-Hispanic African American: Yes     Diabetic: Yes     Tobacco smoker: Yes     Systolic Blood Pressure: 599 mmHg     Is BP treated: Yes     HDL Cholesterol: 42 mg/dL     Total Cholesterol: 165 mg/dL    Social History   Tobacco Use  Smoking Status Every Day   Packs/day: 0.50   Years: 40.00   Pack years: 20.00   Types: Cigarettes  Smokeless Tobacco Never  Tobacco Comments   1 PPD for 35 years   BP  Readings from Last 3 Encounters:  08/10/21 123/85  07/22/21 130/84  01/31/21 120/82   Pulse Readings from Last 3 Encounters:  08/10/21 94  07/22/21 98  01/31/21 100   Wt Readings from Last 3 Encounters:  07/22/21 276 lb (125.2 kg)  01/31/21 277 lb (125.6 kg)  10/18/20 286 lb 6.4 oz (129.9 kg)    Assessment: Review of patient past medical history, allergies, medications, health status, including review of consultants reports, laboratory and other test data, was performed as part of comprehensive evaluation and provision of chronic care management services.   SDOH:  (Social Determinants of Health) assessments and interventions performed:    CCM Care Plan  No Known Allergies  Medications Reviewed Today     Reviewed by Beryle Lathe, [facility] (Pharmacist) on 08/25/21 at 42  Med List Status: <None>   Medication Order Taking? Sig Documenting Provider Last Dose Status Informant  Alcohol Swabs PADS [id number]  1 each by Does not apply route as directed. Fayrene Helper, MD  Active   amLODipine (NORVASC) 10 MG tablet [id number] Yes TAKE 1 TABLET EVERY DAY Fayrene Helper, MD Taking Active   Blood Glucose Monitoring Suppl (TRUE METRIX METER) w/Device KIT [id number]  USE AS DIRECTED Fayrene Helper, MD  Active   cloNIDine (CATAPRES) 0.3 MG tablet [id number] Yes TAKE 1 TABLET EVERY DAY Fayrene Helper, MD Taking Active   empagliflozin (JARDIANCE) 10 MG TABS tablet [id number] No Take 1 tablet (10 mg total) by mouth daily.  Patient not taking: Reported on 08/10/2021   Fayrene Helper, MD Not Taking Active   empagliflozin (JARDIANCE) 10 MG TABS tablet [id number] Yes Take 1 tablet (10 mg total) by mouth daily before breakfast. Fayrene Helper, MD Taking Active   glipiZIDE (GLUCOTROL XL) 10 MG 24 hr tablet [id number] Yes TAKE 1 TABLET EVERY DAY WITH BREAKFAST Fayrene Helper, MD Taking Active   glucose blood (ONE TOUCH ULTRA TEST) test strip [id number]  Use as  instructed for once daily testing  Dx 250.00 length of need- 99 months Fayrene Helper, MD  Active Self  ibuprofen (IBU) 800 MG tablet [id number] Yes TAKE 1 TABLET TWICE DAILY AS NEEDED FOR PAIN Fayrene Helper, MD Taking Active            Med Note Vernell Barrier Aug 10, 2021  3:07 PM) Rarely takes  losartan-hydrochlorothiazide [facility]) 100-25 MG tablet [id number] Yes TAKE 1 TABLET EVERY DAY Fayrene Helper, MD Taking Active   metFORMIN (GLUCOPHAGE) 1000 MG tablet [id number] Yes Take 1 tablet (1,000 mg total) by mouth daily with breakfast. Fayrene Helper,  MD Taking Active   potassium chloride (KLOR-CON) 10 MEQ tablet [id number] Yes TAKE 1 TABLET EVERY DAY Fayrene Helper, MD Taking Active   pravastatin (PRAVACHOL) 80 MG tablet [id number] Yes TAKE 1 TABLET EVERY DAY Fayrene Helper, MD Taking Active   TRUEplus Lancets 33G [place] [id number]  1 each by Does not apply route as directed. Fayrene Helper, MD  Active             Patient Active Problem List   Diagnosis Date Noted   Tobacco abuse 01/30/2020   Special screening for malignant neoplasm of intestine 03/29/2017   Annual physical exam 06/01/2016   Obstructive sleep apnea 04/18/2013   Type 2 diabetes mellitus with vascular disease ([place]) 11/10/2009   Erectile dysfunction due to diseases classified elsewhere 11/10/2009   Hyperlipidemia LDL goal <100 02/11/2008   Morbid obesity ([place]) 02/11/2008   Essential hypertension 02/11/2008    Immunization History  Administered Date(s) Administered   Influenza Whole 10/11/2007, 05/17/2011   Influenza,inj,Quad PF,6+ Mos 06/13/2013, 06/18/2014, 07/22/2015, 06/01/2016, 07/25/2017, 05/30/2018, 06/09/2019, 04/29/2020, 06/13/2021   Moderna Sars-Covid-2 Vaccination 12/05/2019, 01/06/2020, 07/26/2020, 01/28/2021   Pneumococcal Conjugate-13 12/15/2014   Pneumococcal Polysaccharide-23 04/05/2011, 06/01/2016   Td 09/21/2006    Conditions to be  addressed/monitored: HTN, HLD, and DMII  Care Plan : Medication Management  Updates made by Beryle Lathe, [place] since 08/25/2021 12:00 AM     Problem: T2DM, HTN, HLD   Priority: High  Onset Date: 08/10/2021     Long-Range Goal: Disease Progression Prevention   Start Date: 08/10/2021  Expected End Date: 11/08/2021  Recent Progress: On track  Priority: High  Note:   Current Barriers:  Unable to independently afford treatment regimen Unable to independently monitor therapeutic efficacy Unable to achieve control of diabetes and hyperlipidemia  Pharmacist Clinical Goal(s):  Patient will Verbalize ability to afford treatment regimen Achieve adherence to monitoring guidelines and medication adherence to achieve therapeutic efficacy Achieve control of diabetes and hyperlipidemia as evidenced by improved fasting blood sugar, improved A1c, improved LDL, and improved triglycerides through collaboration with PharmD and provider.   Interventions: 1:1 collaboration with Fayrene Helper, MD regarding development and update of comprehensive plan of care as evidenced by provider attestation and co-signature Inter-disciplinary care team collaboration (see longitudinal plan of care) Comprehensive medication review performed; medication list updated in electronic medical record  Type 2 Diabetes - Goal on Track (progressing): YES.: Uncontrolled; Most recent A1c above goal of <7% per ADA guidelines Current medications: metformin 1,000 mg by mouth once daily, empagliflozin (Jardiance) 10 mg by mouth once daily, and glipizide XL 10 mg by mouth with breakfast Intolerances: none Taking medications as directed: yes Side effects thought to be attributed to current medication regimen: no Reports hypoglycemic symptoms (shaky/jittery) this AM with a blood glucose of 71 a couple hours after taking glipizide. Patient reports he is treating with peppermint candy now and is feeling better. He reports  that he will make sure blood glucose increases soon.  Hypoglycemia prevention: not indicated at this time Current meal patterns: not discussed today Current exercise: not discussed today On a statin: yes On aspirin 81 mg daily: no Last microalbumin/creatinine ratio: 34 (08/23/20); on an ACEi/ARB: yes Current glucose readings: fasting blood glucose: within goal range of 80-130 mg/dL per ADA guidelines. Patient instructed to start checking blood glucose once daily after initiation of Jardiance. Patient verbalizes understanding. Discussed management of hypoglycemia. If blood sugar <70 at any time, treat with simple sugar such as 1/2 cup  juice or regular soda or 3-4 glucose tablets. Recheck blood sugar in 15 minutes and repeat if blood sugar remains <70.  Since this is first incidence of hypoglycemia since starting Jardiance according to the patient, instructed patient to continue current regimen for now but to discontinue glipizide if another hypoglycemic event occurs. Will continue Jardiance and metformin. May need to increase metformin back to 1,000 mg by mouth twice daily if more glycemic control needed. Will discuss more at next visit.  Will have patient get BMP 2-4 weeks after initiation of therapy. Patient in agreement.   Hypertension - Condition stable. Not addressed this visit.: Blood pressure under good control. Blood pressure is at goal of <130/80 mmHg per 2017 AHA/ACC guidelines. Current medications: amlodipine 10 mg by mouth once daily, losartan-HCTZ 100-25mg  by mouth once daily, and clonidine 0.3 mg by mouth once daily Intolerances: none Taking medications as directed: yes Side effects thought to be attributed to current medication regimen: no Current home blood pressure: not discussed today  Continue current medications as above If further blood pressure lowering needed, consider addition of hydralazine twice daily or three time daily Unclear why only prescribed clonidine once daily  as this has a short half life and is now helping lower blood pressure but for a short period of time each day  Hyperlipidemia - Condition stable. Not addressed this visit.: Uncontrolled. LDL above goal of <70 due to very high risk given 10-year risk >20% per 2020 AACE/ACE guidelines. Triglycerides above goal of <150 per 2020 AACE/ACE guidelines. Current medications: pravastatin 80 mg by mouth once daily Intolerances: none Taking medications as directed: yes Side effects thought to be attributed to current medication regimen: no Encourage dietary reduction of high fat containing foods such as butter, nuts, bacon, egg yolks, etc. Recommend regular aerobic exercise Discussed need for and importance of continued work on weight loss Reviewed risks of hyperlipidemia, principles of treatment and consequences of untreated hyperlipidemia Patient counseled on smoking cessation Re-check lipid panel in 4-12 weeks Consider increasing to high intensity statin or add ezetimibe 10 mg by mouth daily. Will discuss more at future visits.  Patient Goals/Self-Care Activities Patient will:  Take medications as prescribed Check blood sugar as directed by PCP, document, and provide at future appointments Check blood pressure at least once daily, document, and provide at future appointments Collaborate with provider on medication access solutions  Follow Up Plan: Telephone follow up appointment with care management team member scheduled for: 09/07/21      Medication Assistance: None required.  Patient affirms current coverage meets needs.  Patient's preferred pharmacy is:  [facility] [address] [place] - [place] [address] [place] [address] Phone: [phone] Fax: [phone]  [facility] - [place], [place] [address] Phone: [phone] Fax: [phone]  Follow Up:  Patient agrees to Care Plan  and Follow-up.  Plan: Telephone follow up appointment with care management team member scheduled for:  09/07/21  Kennon Holter, PharmD, [place], [place] Clinical Pharmacist Practitioner [facility] [phone]

## 2021-08-25 NOTE — Patient Instructions (Signed)
ARIK HUSMANN,  It was great to talk to you today!  Please call me with any questions or concerns.   If you have a low blood sugar less than 70, please eat 15 grams of carbohydrates (4 oz of juice, soda, 4 glucose tablets, or 3-4 pieces of hard candy). Wait 15 minutes and then recheck your blood sugar. If your sugar is still less than 70, eat another 15 grams of carbohydrates. Wait another 15 minutes and recheck your glucose. Continue this until your sugar is over 70. Then, eat a snack with protein in it to prevent your sugar from dropping again.  Discontinue glipizide if you have another low blood glucose like you did today.   Visit Information  Following are the goals we discussed today:  Patient Goals/Self-Care Activities Patient will:  Take medications as prescribed Check blood sugar as directed by PCP, document, and provide at future appointments Check blood pressure at least once daily, document, and provide at future appointments Collaborate with provider on medication access solutions  Plan: Telephone follow up appointment with care management team member scheduled for:  09/07/21  Kennon Holter, PharmD, BCACP, CPP Clinical Pharmacist Practitioner [facility] [phone]   Please call the care guide team at [phone] if you need to cancel or reschedule your appointment.   Patient verbalizes understanding of instructions provided today and agrees to view in [place].

## 2021-09-07 ENCOUNTER — Ambulatory Visit (INDEPENDENT_AMBULATORY_CARE_PROVIDER_SITE_OTHER): Payer: Medicare HMO | Admitting: Pharmacist

## 2021-09-07 DIAGNOSIS — E1159 Type 2 diabetes mellitus with other circulatory complications: Secondary | ICD-10-CM

## 2021-09-07 DIAGNOSIS — E785 Hyperlipidemia, unspecified: Secondary | ICD-10-CM

## 2021-09-07 DIAGNOSIS — Z720 Tobacco use: Secondary | ICD-10-CM

## 2021-09-07 DIAGNOSIS — I10 Essential (primary) hypertension: Secondary | ICD-10-CM

## 2021-09-07 MED ORDER — METFORMIN HCL 1000 MG PO TABS: 1000.0000 mg | ORAL_TABLET | Freq: Two times a day (BID) | ORAL | 3 refills | Status: DC

## 2021-09-07 NOTE — Patient Instructions (Addendum)
Dakota Mendez,  It was great to talk to you today!  Please come get your blood work drawn (BMP) over the next week at your convenience Stop taking glipizide Increase metformin to 1,000 mg by mouth twice daily Continue Jardiance 10 mg by mouth daily Contact [facility] [place] and work with them to help you stop smoking. They may be able to send you free samples of the patch + gum or lozenge  Please call me with any questions or concerns.   Visit Information  Following are the goals we discussed today:  Patient Goals/Self-Care Activities Patient will:  Take medications as prescribed Check blood sugar once daily, document, and provide at future appointments Check blood pressure at least once daily, document, and provide at future appointments Collaborate with provider on medication access solutions Contact [facility] [name] and work with them to help you stop smoking  Plan: Telephone follow up appointment with care management team member scheduled for:  10/05/21  Kennon Holter, PharmD, BCACP, CPP Clinical Pharmacist Practitioner [facility] [phone]  Please call the care guide team at [phone] if you need to cancel or reschedule your appointment.   Patient verbalizes understanding of instructions provided today and agrees to view in [place].

## 2021-09-07 NOTE — Chronic Care Management (AMB) (Signed)
Chronic Care Management Pharmacy Note  09/07/2021 Name:  Dakota Mendez MRN:  [id number] DOB:  Dec 04, 1960  Summary: Type 2 Diabetes Continue Jardiance 10 mg by mouth daily. Patient aware to come get BMP soon since he has now been on therapy for a few weeks. Discontinue glipizide due to recurrent hypoglycemic episodes with symptoms Increase metformin to 1,000 mg by mouth twice daily. Patient reports he is able to tolerate. Renal function fine.   Hyperlipidemia Consider increasing to high intensity statin or add ezetimibe 10 mg by mouth daily. Will discuss more at future visits.  Tobacco Abuse (Current Smoker) Patient endorses that he currently smokes ~15 cigarettes per day Previous quit attempts: Chantix (unable to tolerate due to nightmares and worsening mood) Patient reports he has never tried bupropion but prefers to try nicotine replacement therapy at this time to assist him in quitting Unfortunately his insurance does not cover nicotine patches, gum, or lozenge Advised patient to quit smoking and offered support Referred to a tobacco cessation program ([facility]) who may be apply to supply him with free samples of combination nicotine replacement therapy. Will follow-up on progress of this at next visit.   Subjective: Dakota Mendez is an 60 y.o. year old male who is a primary patient of Moshe Cipro, [place] Levo, MD.  The CCM team was consulted for assistance with disease management and care coordination needs.    Engaged with patient by telephone for follow up visit in response to provider referral for pharmacy case management and/or care coordination services.   Consent to Services:  The patient was given information about Chronic Care Management services, agreed to services, and gave verbal consent prior to initiation of services.  Please see initial visit note for detailed documentation.   Patient Care Team: Fayrene Helper, MD as PCP - General Beryle Lathe,  [facility] (Pharmacist)  Objective:  Lab Results  Component Value Date   CREATININE 1.03 07/21/2021   CREATININE 1.05 01/31/2021   CREATININE 1.04 10/15/2020    Lab Results  Component Value Date   HGBA1C 7.3 (H) 07/21/2021   Last diabetic Eye exam:  Lab Results  Component Value Date/Time   HMDIABEYEEXA No Retinopathy 04/05/2020 12:00 AM    Last diabetic Foot exam:  Lab Results  Component Value Date/Time   HMDIABFOOTEX yes 02/18/2010 12:00 AM        Component Value Date/Time   CHOL 165 10/15/2020 0805   TRIG 172 (H) 10/15/2020 0805   HDL 42 10/15/2020 0805   CHOLHDL 3.9 10/15/2020 0805   CHOLHDL 4.5 08/12/2019 0736   VLDL 63 (H) 02/13/2017 0720   LDLCALC 93 10/15/2020 0805   LDLCALC 115 (H) 08/12/2019 0736    Hepatic Function Latest Ref Rng & Units 10/15/2020 04/28/2020 08/12/2019  Total Protein 6.0 - 8.5 g/dL 6.9 6.9 6.9  Albumin 3.8 - 4.9 g/dL 4.3 4.1 -  AST 0 - 40 IU/L _0 ALT 0 - 44 IU/L _1 Alk Phosphatase 44 - 121 IU/L 75 80 -  Total Bilirubin 0.0 - 1.2 mg/dL 0.7 0.6 0.8  Bilirubin, Direct 0.0 - 0.3 mg/dL - - -    Lab Results  Component Value Date/Time   TSH 2.750 07/21/2021 08:27 AM   TSH 2.850 01/31/2021 09:53 AM    CBC Latest Ref Rng & Units 08/23/2020 08/12/2019 05/31/2018  WBC 3.4 - 10.8 x10E3/uL 12.1(H) 12.2(H) 11.5(H)  Hemoglobin 13.0 - 17.7 g/dL 13.6 14.1 14.6  Hematocrit 37.5 -  51.0 % 40.8 42.1 43.9  Platelets 150 - 450 x10E3/uL 393 370 379    Lab Results  Component Value Date/Time   VD25OH 36.3 07/21/2021 08:27 AM   VD25OH 43.0 01/31/2021 09:53 AM    Clinical ASCVD: No  The 10-year ASCVD risk score (Arnett DK, et al., 2019) is: 33.2%   Values used to calculate the score:     Age: 60 years     Sex: Male     Is Non-Hispanic African American: Yes     Diabetic: Yes     Tobacco smoker: Yes     Systolic Blood Pressure: 680 mmHg     Is BP treated: Yes     HDL Cholesterol: 42 mg/dL     Total Cholesterol: 165 mg/dL    Social History    Tobacco Use  Smoking Status Every Day   Packs/day: 0.50   Years: 40.00   Pack years: 20.00   Types: Cigarettes  Smokeless Tobacco Never  Tobacco Comments   1 PPD for 35 years   BP Readings from Last 3 Encounters:  08/10/21 123/85  07/22/21 130/84  01/31/21 120/82   Pulse Readings from Last 3 Encounters:  08/10/21 94  07/22/21 98  01/31/21 100   Wt Readings from Last 3 Encounters:  07/22/21 276 lb (125.2 kg)  01/31/21 277 lb (125.6 kg)  10/18/20 286 lb 6.4 oz (129.9 kg)    Assessment: Review of patient past medical history, allergies, medications, health status, including review of consultants reports, laboratory and other test data, was performed as part of comprehensive evaluation and provision of chronic care management services.   SDOH:  (Social Determinants of Health) assessments and interventions performed:    CCM Care Plan  No Known Allergies  Medications Reviewed Today     Reviewed by Beryle Lathe, [facility] (Pharmacist) on 09/07/21 at [place] List Status: <None>   Medication Order Taking? Sig Documenting Provider Last Dose Status Informant  Alcohol Swabs PADS [id number]  1 each by Does not apply route as directed. Fayrene Helper, MD  Active   amLODipine (NORVASC) 10 MG tablet [id number] Yes TAKE 1 TABLET EVERY DAY Fayrene Helper, MD Taking Active   Blood Glucose Monitoring Suppl (TRUE METRIX METER) w/Device KIT [id number]  USE AS DIRECTED Fayrene Helper, MD  Active   cloNIDine (CATAPRES) 0.3 MG tablet [id number] Yes TAKE 1 TABLET EVERY DAY Fayrene Helper, MD Taking Active   empagliflozin (JARDIANCE) 10 MG TABS tablet [id number] No Take 1 tablet (10 mg total) by mouth daily.  Patient not taking: Reported on 09/07/2021   Fayrene Helper, MD Not Taking Active   empagliflozin (JARDIANCE) 10 MG TABS tablet [id number] Yes Take 1 tablet (10 mg total) by mouth daily before breakfast. Fayrene Helper, MD Taking Active   glucose blood (ONE  TOUCH ULTRA TEST) test strip [id number]  Use as instructed for once daily testing  Dx 250.00 length of need- 99 months Fayrene Helper, MD  Active Self  ibuprofen (IBU) 800 MG tablet [id number] Yes TAKE 1 TABLET TWICE DAILY AS NEEDED FOR PAIN Fayrene Helper, MD Taking Active            Med Note Vernell Barrier Aug 10, 2021  3:07 PM) Rarely takes  losartan-hydrochlorothiazide (HYZAAR) 100-25 MG tablet [id number] Yes TAKE 1 TABLET EVERY DAY Fayrene Helper, MD Taking Active   metFORMIN (GLUCOPHAGE) 1000 MG tablet [id number] Yes Take 1 tablet (1,000 mg total)  by mouth daily with breakfast. Fayrene Helper, MD Taking Active   potassium chloride (KLOR-CON) 10 MEQ tablet [id number] Yes TAKE 1 TABLET EVERY DAY Fayrene Helper, MD Taking Active   pravastatin (PRAVACHOL) 80 MG tablet [id number] Yes TAKE 1 TABLET EVERY DAY Fayrene Helper, MD Taking Active   TRUEplus Lancets 33G [place] [id number]  1 each by Does not apply route as directed. Fayrene Helper, MD  Active             Patient Active Problem List   Diagnosis Date Noted   Tobacco abuse 01/30/2020   Special screening for malignant neoplasm of intestine 03/29/2017   Annual physical exam 06/01/2016   Obstructive sleep apnea 04/18/2013   Type 2 diabetes mellitus with vascular disease ([place]) 11/10/2009   Erectile dysfunction due to diseases classified elsewhere 11/10/2009   Hyperlipidemia LDL goal <100 02/11/2008   Morbid obesity ([place]) 02/11/2008   Essential hypertension 02/11/2008    Immunization History  Administered Date(s) Administered   Influenza Whole 10/11/2007, 05/17/2011   Influenza,inj,Quad PF,6+ Mos 06/13/2013, 06/18/2014, 07/22/2015, 06/01/2016, 07/25/2017, 05/30/2018, 06/09/2019, 04/29/2020, 06/13/2021   Moderna Sars-Covid-2 Vaccination 12/05/2019, 01/06/2020, 07/26/2020, 01/28/2021   Pneumococcal Conjugate-13 12/15/2014   Pneumococcal Polysaccharide-23 04/05/2011, 06/01/2016   Td  09/21/2006    Conditions to be addressed/monitored: HTN, HLD, DMII, and Tobacco Use  Care Plan : Medication Management  Updates made by Beryle Lathe, [place] since 09/07/2021 12:00 AM     Problem: T2DM, HTN, HLD, Tobacco Use   Priority: High  Onset Date: 08/10/2021     Long-Range Goal: Disease Progression Prevention   Start Date: 08/10/2021  Expected End Date: 11/08/2021  Recent Progress: On track  Priority: High  Note:   Current Barriers:  Unable to independently afford treatment regimen Unable to independently monitor therapeutic efficacy Unable to achieve control of diabetes, hyperlipidemia, and smoking cessation  Pharmacist Clinical Goal(s):  Through collaboration with PharmD and provider, patient will: Verbalize ability to afford treatment regimen Achieve adherence to monitoring guidelines and medication adherence to achieve therapeutic efficacy Achieve control of diabetes, hyperlipidemia, and smoking cessation as evidenced by improved fasting blood sugar, improved A1c, improved LDL, improved triglycerides, and complete smoking cessation   Interventions: 1:1 collaboration with Fayrene Helper, MD regarding development and update of comprehensive plan of care as evidenced by provider attestation and co-signature Inter-disciplinary care team collaboration (see longitudinal plan of care) Comprehensive medication review performed; medication list updated in electronic medical record  Type 2 Diabetes - Goal on Track (progressing): YES.: Uncontrolled; Most recent A1c above goal of <7% per ADA guidelines Current medications: metformin 1,000 mg by mouth once daily, empagliflozin (Jardiance) 10 mg by mouth once daily, and glipizide XL 10 mg by mouth with breakfast Intolerances: none Taking medications as directed: yes Side effects thought to be attributed to current medication regimen: yes, occasional mild hypoglycemia (blood glucose ~70) requiring treatment about once  per week Reports hypoglycemic symptoms (shaky/jittery) about once per week which usually happens a couple hours after taking glipizide. Patient reports he treats with peppermint candy and usually feels better after a few minutes Hypoglycemia prevention: not indicated at this time Current meal patterns: not discussed today Current exercise: not discussed today On a statin: yes On aspirin 81 mg daily: no Last microalbumin/creatinine ratio: 34 (08/23/20); on an ACEi/ARB: yes Current glucose readings: fasting blood glucose: within goal range of 80-130 mg/dL per ADA guidelines. Patient reports fasting blood glucose over last few days 119 and 138. Did not check  for a few days during the holiday weekend.  Patient instructed to check blood glucose once daily (fasting) Discussed management of hypoglycemia. If blood sugar <70 at any time, treat with simple sugar such as 1/2 cup juice or regular soda or 3-4 glucose tablets. Recheck blood sugar in 15 minutes and repeat if blood sugar remains <70.  Continue Jardiance 10 mg by mouth daily. Patient aware to come get BMP soon since he has now been on therapy for a few weeks. Discontinue glipizide due to recurrent hypoglycemic episodes with symptoms Increase metformin to 1,000 mg by mouth twice daily. Patient reports he is able to tolerate. Renal function fine.   Hypertension - Condition stable. Not addressed this visit.: Blood pressure under good control. Blood pressure is at goal of <130/80 mmHg per 2017 AHA/ACC guidelines. Current medications: amlodipine 10 mg by mouth once daily, losartan-HCTZ 100-77m by mouth once daily, and clonidine 0.3 mg by mouth once daily Intolerances: none Taking medications as directed: yes Side effects thought to be attributed to current medication regimen: no Current home blood pressure: patient reports he has a home blood pressure cuff but has not been checking lately. Plans to start checking every other day. Continue current  medications as above If further blood pressure lowering needed, consider increasing clonidine to twice daily  Hyperlipidemia - Goal on Track (progressing): YES.: Uncontrolled. LDL above goal of <70 due to very high risk given 10-year risk >20% per 2020 AACE/ACE guidelines. Triglycerides above goal of <150 per 2020 AACE/ACE guidelines. Current medications: pravastatin 80 mg by mouth once daily Intolerances: none Taking medications as directed: yes Side effects thought to be attributed to current medication regimen: no Encourage dietary reduction of high fat containing foods such as butter, nuts, bacon, egg yolks, etc. Recommend regular aerobic exercise Discussed need for and importance of continued work on weight loss Reviewed risks of hyperlipidemia, principles of treatment and consequences of untreated hyperlipidemia Patient counseled on smoking cessation Re-check lipid panel in 4-12 weeks Consider increasing to high intensity statin or add ezetimibe 10 mg by mouth daily. Will discuss more at future visits.  Tobacco Abuse (Current Smoker) - New goal.: Patient endorses that he currently smokes ~15 cigarettes per day Previous quit attempts: Chantix (unable to tolerate due to nightmares and worsening mood) Patient reports he has never tried bupropion but prefers to try nicotine replacement therapy at this time to assist him in quitting Unfortunately his insurance does not cover nicotine patches, gum, or lozenge Advised patient to quit smoking and offered support Ref[place] a [facility] cessation program ([facility] who may be apply to supply him with free samples of combination nicotine replacement therapy. Will follow-up on progress of this at next visit.   Patient Goals/Self-Care Activities Patient will:  Take medications as prescribed Check blood sugar once daily, document, and provide at future appointments Check blood pressure at least once daily, document, and provide at future  appointments Collaborate with provider on medication access solutions Contact [facility] and work with them to help you stop smoking  Follow Up Plan: Telephone follow up appointment with care management team member scheduled for: 10/05/21      Medication Assistance: None required.  Patient affirms current coverage meets needs.  [facility]  [facility] [address] [place] [address]Phone: [phone]: [phone] [place] [address]  [address] Phone: [phone] Fax: [phone]  Follow Up:  Patient agrees to Care Plan and Follow-up.  Plan: Telephone follow up appointment with care management team member scheduled for:  10/05/21  Kennon Holter, PharmD, [place], [place] Clinical Pharmacist Practitioner [facility] [phone]

## 2021-09-10 DIAGNOSIS — I10 Essential (primary) hypertension: Secondary | ICD-10-CM | POA: Diagnosis not present

## 2021-09-10 DIAGNOSIS — E785 Hyperlipidemia, unspecified: Secondary | ICD-10-CM

## 2021-09-10 DIAGNOSIS — E1159 Type 2 diabetes mellitus with other circulatory complications: Secondary | ICD-10-CM | POA: Diagnosis not present

## 2021-09-12 ENCOUNTER — Other Ambulatory Visit: Payer: Self-pay | Admitting: Family Medicine

## 2021-09-14 ENCOUNTER — Ambulatory Visit (HOSPITAL_COMMUNITY): Payer: Medicare HMO

## 2021-09-16 ENCOUNTER — Telehealth: Payer: Self-pay

## 2021-09-16 ENCOUNTER — Other Ambulatory Visit: Payer: Self-pay

## 2021-09-16 MED ORDER — EMPAGLIFLOZIN 10 MG PO TABS: 10.0000 mg | ORAL_TABLET | Freq: Every day | ORAL | 0 refills | Status: DC

## 2021-09-16 NOTE — Telephone Encounter (Signed)
Patient coming to pick up sample

## 2021-09-16 NOTE — Telephone Encounter (Signed)
Patient called currently out of this medicine empagliflozin (JARDIANCE) 10 MG TABS tablet .  Patient called the pharmacy and have not received this medicine yet.  Took the last one this morning.  Samples ran out this morning. Please have clinic nurse give me a call back today [phone].

## 2021-09-19 DIAGNOSIS — E1159 Type 2 diabetes mellitus with other circulatory complications: Secondary | ICD-10-CM | POA: Diagnosis not present

## 2021-09-20 LAB — BASIC METABOLIC PANEL
BUN/Creatinine Ratio: 14 (ref 9–20)
BUN: 16 mg/dL (ref 6–24)
CO2: 22 mmol/L (ref 20–29)
Calcium: 9.9 mg/dL (ref 8.7–10.2)
Chloride: 100 mmol/L (ref 96–106)
Creatinine, Ser: 1.13 mg/dL (ref 0.76–1.27)
Glucose: 174 mg/dL — ABNORMAL HIGH (ref 70–99)
Potassium: 4.3 mmol/L (ref 3.5–5.2)
Sodium: 140 mmol/L (ref 134–144)
eGFR: 75 mL/min/{1.73_m2} (ref >59)

## 2021-09-21 ENCOUNTER — Ambulatory Visit (INDEPENDENT_AMBULATORY_CARE_PROVIDER_SITE_OTHER): Payer: Medicare HMO | Admitting: Pharmacist

## 2021-09-21 DIAGNOSIS — I10 Essential (primary) hypertension: Secondary | ICD-10-CM

## 2021-09-21 DIAGNOSIS — Z720 Tobacco use: Secondary | ICD-10-CM

## 2021-09-21 DIAGNOSIS — E785 Hyperlipidemia, unspecified: Secondary | ICD-10-CM

## 2021-09-21 DIAGNOSIS — E1159 Type 2 diabetes mellitus with other circulatory complications: Secondary | ICD-10-CM

## 2021-09-21 NOTE — Patient Instructions (Signed)
BRADIN MCADORY,  It was great to talk to you today!  Please call me with any questions or concerns.   Visit Information  Following are the goals we discussed today:  Patient Goals/Self-Care Activities Patient will:  Take medications as prescribed Check blood sugar once daily, document, and provide at future appointments Check blood pressure at least once daily, document, and provide at future appointments Collaborate with provider on medication access solutions Engage in dietary modifications by fewer sweetened foods & beverages Contact [facility] [place] and work with them to help you stop smoking  Plan: Telephone follow up appointment with care management team member scheduled for:  10/19/21  Kennon Holter, PharmD, BCACP, CPP Clinical Pharmacist Practitioner [facility] [phone]  Please call the care guide team at [phone] if you need to cancel or reschedule your appointment.   Patient verbalizes understanding of instructions provided today and agrees to view in [place].

## 2021-09-21 NOTE — Chronic Care Management (AMB) (Signed)
Chronic Care Management Pharmacy Note  09/21/2021 Name:  Dakota Mendez MRN:  [id number] DOB:  02-23-1961  Summary: Type 2 Diabetes Uncontrolled; Most recent A1c above goal of <7% per ADA guidelines Current medications: metformin 1,000 mg by mouth once daily and empagliflozin (Jardiance) 10 mg by mouth once daily Patient recently discontinued glipizide as instructed due to reoccurring hypoglycemia BMP after starting Jardiance stable Side effects thought to be attributed to current medication regimen: yes, patient reports recent redness on penis which he described as potential yeast infection. Patient denies fever, suprapubic pain, flank pain, dysuria, and hematuria. Patient reports this occurred for a few days but resolved on its own without intervention Continue Jardiance 10 mg by mouth daily. Patient instructed to let me know if urinary symptoms return especially is symptoms of urinary tract infection but if symptoms are consistent with yeast infection then patient instructed to treat with over the counter clotrimazole cream for 7-14 days until it resolves. Patient just received 3 month supply from mail order pharmacy.  Continue metformin 1,000 mg by mouth twice daily Discussed reducing sweetened foods and beverages  Hyperlipidemia Uncontrolled. LDL above goal of <70 due to very high risk given 10-year risk >20% per 2020 AACE/ACE guidelines. Triglycerides above goal of <150 per 2020 AACE/ACE guidelines. Current medications: pravastatin 80 mg by mouth once daily Patient to have repeat lipid panel before next primary care provider visit in 1 month. If LDL remains elevated, consider increasing to high intensity statin or add ezetimibe 10 mg by mouth daily.  Tobacco Abuse (Current Smoker) Referred to a tobacco cessation program ([facility]) who may be apply to supply him with free samples of combination nicotine replacement therapy. Patient reports he has not had a chance to call yet but  plans to call them today. Will follow-up on progress of this at next visit.   Subjective: Dakota Mendez is an 61 y.o. year old male who is a primary patient of Lodema Hong, Milus Mallick, MD.  The CCM team was consulted for assistance with disease management and care coordination needs.    Engaged with patient by telephone for follow up visit in response to provider referral for pharmacy case management and/or care coordination services.   Consent to Services:  The patient was given information about Chronic Care Management services, agreed to services, and gave verbal consent prior to initiation of services.  Please see initial visit note for detailed documentation.   Patient Care Team: Kerri Perches, MD as PCP - General Gavin Pound, [facility] (Pharmacist)  Objective:  Lab Results  Component Value Date   CREATININE 1.13 09/19/2021   CREATININE 1.03 07/21/2021   CREATININE 1.05 01/31/2021    Lab Results  Component Value Date   HGBA1C 7.3 (H) 07/21/2021   Last diabetic Eye exam:  Lab Results  Component Value Date/Time   HMDIABEYEEXA No Retinopathy 04/05/2020 12:00 AM    Last diabetic Foot exam:  Lab Results  Component Value Date/Time   HMDIABFOOTEX yes 02/18/2010 12:00 AM        Component Value Date/Time   CHOL 165 10/15/2020 0805   TRIG 172 (H) 10/15/2020 0805   HDL 42 10/15/2020 0805   CHOLHDL 3.9 10/15/2020 0805   CHOLHDL 4.5 08/12/2019 0736   VLDL 63 (H) 02/13/2017 0720   LDLCALC 93 10/15/2020 0805   LDLCALC 115 (H) 08/12/2019 0736    Hepatic Function Latest Ref Rng & Units 10/15/2020 04/28/2020 08/12/2019  Total Protein 6.0 - 8.5 g/dL 6.9  6.9 6.9  Albumin 3.8 - 4.9 g/dL 4.3 4.1 -  AST 0 - 40 IU/L $Remov'13 10 10  'QXHboA$ ALT 0 - 44 IU/L $Remov'10 13 10  'evFPyz$ Alk Phosphatase 44 - 121 IU/L 75 80 -  Total Bilirubin 0.0 - 1.2 mg/dL 0.7 0.6 0.8  Bilirubin, Direct 0.0 - 0.3 mg/dL - - -    Lab Results  Component Value Date/Time   TSH 2.750 07/21/2021 08:27 AM   TSH 2.850 01/31/2021  09:53 AM    CBC Latest Ref Rng & Units 08/23/2020 08/12/2019 05/31/2018  WBC 3.4 - 10.8 x10E3/uL 12.1(H) 12.2(H) 11.5(H)  Hemoglobin 13.0 - 17.7 g/dL 13.6 14.1 14.6  Hematocrit 37.5 - 51.0 % 40.8 42.1 43.9  Platelets 150 - 450 x10E3/uL 393 370 379    Lab Results  Component Value Date/Time   VD25OH 36.3 07/21/2021 08:27 AM   VD25OH 43.0 01/31/2021 09:53 AM    Clinical ASCVD: No  The 10-year ASCVD risk score (Arnett DK, et al., 2019) is: 33.2%   Values used to calculate the score:     Age: 12 years     Sex: Male     Is Non-Hispanic African American: Yes     Diabetic: Yes     Tobacco smoker: Yes     Systolic Blood Pressure: 163 mmHg     Is BP treated: Yes     HDL Cholesterol: 42 mg/dL     Total Cholesterol: 165 mg/dL    Social History   Tobacco Use  Smoking Status Every Day   Packs/day: 0.50   Years: 40.00   Pack years: 20.00   Types: Cigarettes  Smokeless Tobacco Never  Tobacco Comments   1 PPD for 35 years   BP Readings from Last 3 Encounters:  08/10/21 123/85  07/22/21 130/84  01/31/21 120/82   Pulse Readings from Last 3 Encounters:  08/10/21 94  07/22/21 98  01/31/21 100   Wt Readings from Last 3 Encounters:  07/22/21 276 lb (125.2 kg)  01/31/21 277 lb (125.6 kg)  10/18/20 286 lb 6.4 oz (129.9 kg)    Assessment: Review of patient past medical history, allergies, medications, health status, including review of consultants reports, laboratory and other test data, was performed as part of comprehensive evaluation and provision of chronic care management services.   SDOH:  (Social Determinants of Health) assessments and interventions performed:    CCM Care Plan  No Known Allergies  Medications Reviewed Today     Reviewed by Beryle Lathe, [facility] (Pharmacist) on 09/21/21 at [phone]  Med List Status: <None>   Medication Order Taking? Sig Documenting Provider Last Dose Status Informant  Alcohol Swabs PADS [id number]  1 each by Does not apply route as  directed. Fayrene Helper, MD  Active   amLODipine (NORVASC) 10 MG tablet [id number] Yes TAKE 1 TABLET EVERY DAY Fayrene Helper, MD Taking Active   Blood Glucose Monitoring Suppl (TRUE METRIX METER) w/Device KIT [id number]  USE AS DIRECTED Fayrene Helper, MD  Active   cloNIDine (CATAPRES) 0.3 MG tablet [id number] Yes TAKE 1 TABLET EVERY DAY Fayrene Helper, MD Taking Active   empagliflozin (JARDIANCE) 10 MG TABS tablet [id number] Yes Take 1 tablet (10 mg total) by mouth daily. Fayrene Helper, MD Taking Active   ibuprofen (IBU) 800 MG tablet [id number] Yes TAKE 1 TABLET TWICE DAILY AS NEEDED FOR PAIN Fayrene Helper, MD Taking Active            Med Note [place], [place]  J   Wed Aug 10, 2021  3:07 PM) Rarely takes  losartan-hydrochlorothiazide (HYZAAR) 100-25 MG tablet [id number] Yes TAKE 1 TABLET EVERY DAY Fayrene Helper, MD Taking Active   metFORMIN (GLUCOPHAGE) 1000 MG tablet [id number] Yes Take 1 tablet (1,000 mg total) by mouth 2 (two) times daily with a meal. Fayrene Helper, MD Taking Active   potassium chloride (KLOR-CON) 10 MEQ tablet [id number] Yes TAKE 1 TABLET EVERY DAY Fayrene Helper, MD Taking Active   pravastatin (PRAVACHOL) 80 MG tablet [id number] Yes TAKE 1 TABLET EVERY DAY Fayrene Helper, MD Taking Active   TRUE METRIX BLOOD GLUCOSE TEST test strip [id number]  TEST BLOOD SUGAR  UP  TO FOUR TIMES DAILY AS DIRECTED Fayrene Helper, MD  Active   TRUEplus Lancets 33G [place] [id number]  1 each by Does not apply route as directed. Fayrene Helper, MD  Active             Patient Active Problem List   Diagnosis Date Noted   Tobacco abuse 01/30/2020   Special screening for malignant neoplasm of intestine 03/29/2017   Annual physical exam 06/01/2016   Obstructive sleep apnea 04/18/2013   Type 2 diabetes mellitus with vascular disease ([place]) 11/10/2009   Erectile dysfunction due to diseases classified elsewhere 11/10/2009   Hyperlipidemia  LDL goal <100 02/11/2008   Morbid obesity ([place]) 02/11/2008   Essential hypertension 02/11/2008    Immunization History  Administered Date(s) Administered   Influenza Whole 10/11/2007, 05/17/2011   Influenza,inj,Quad PF,6+ Mos 06/13/2013, 06/18/2014, 07/22/2015, 06/01/2016, 07/25/2017, 05/30/2018, 06/09/2019, 04/29/2020, 06/13/2021   Moderna Sars-Covid-2 Vaccination 12/05/2019, 01/06/2020, 07/26/2020, 01/28/2021   Pneumococcal Conjugate-13 12/15/2014   Pneumococcal Polysaccharide-23 04/05/2011, 06/01/2016   Td 09/21/2006    Conditions to be addressed/monitored: HTN, HLD, DMII, and Tobacco Use  Care Plan : Medication Management  Updates made by Beryle Lathe, [place] since 09/21/2021 12:00 AM     Problem: T2DM, HTN, HLD, Tobacco Use   Priority: High  Onset Date: 08/10/2021     Long-Range Goal: Disease Progression Prevention   Start Date: 08/10/2021  Expected End Date: 11/08/2021  Recent Progress: On track  Priority: High  Note:   Current Barriers:  Unable to independently monitor therapeutic efficacy Unable to achieve control of diabetes, hyperlipidemia, and smoking cessation  Pharmacist Clinical Goal(s):  Through collaboration with PharmD and provider, patient will: Achieve adherence to monitoring guidelines and medication adherence to achieve therapeutic efficacy Achieve control of diabetes, hyperlipidemia, and smoking cessation as evidenced by improved fasting blood sugar, improved A1c, improved LDL, improved triglycerides, and complete smoking cessation   Interventions: 1:1 collaboration with Fayrene Helper, MD regarding development and update of comprehensive plan of care as evidenced by provider attestation and co-signature Inter-disciplinary care team collaboration (see longitudinal plan of care) Comprehensive medication review performed; medication list updated in electronic medical record  Type 2 Diabetes - Goal on Track (progressing):  YES.: Uncontrolled; Most recent A1c above goal of <7% per ADA guidelines Current medications: metformin 1,000 mg by mouth once daily and empagliflozin (Jardiance) 10 mg by mouth once daily Patient recently discontinued glipizide as instructed due to reoccurring hypoglycemia BMP after starting Jardiance stable Intolerances: none Taking medications as directed: yes Side effects thought to be attributed to current medication regimen: yes, patient reports recent redness on penis which he described as potential yeast infection. Patient denies fever, suprapubic pain, flank pain, dysuria, and hematuria. Patient reports this occurred for a few days but resolved on its own without  intervention Denies recent hypoglycemic symptoms (shaky/jittery) since discontinuing glipizide Hypoglycemia prevention: not indicated at this time Current meal patterns:  patient reports drinking 1 soda per day and 2 cups of coffee with sugar Current exercise: not discussed today On a statin: yes On aspirin 81 mg daily: no Last microalbumin/creatinine ratio: 34 (08/23/20); on an ACEi/ARB: yes Current glucose readings: fasting blood glucose: within goal range of 80-130 mg/dL per ADA guidelines. Patient reports fasting blood glucose over last few days 120s-140. Patient instructed to check blood glucose once daily (fasting) Discussed management of hypoglycemia. If blood sugar <70 at any time, treat with simple sugar such as 1/2 cup juice or regular soda or 3-4 glucose tablets. Recheck blood sugar in 15 minutes and repeat if blood sugar remains <70.  Continue Jardiance 10 mg by mouth daily. Patient instructed to let me know if urinary symptoms return especially is symptoms of urinary tract infection but if symptoms are consistent with yeast infection then patient instructed to treat with over the counter clotrimazole cream for 7-14 days until it resolves. Patient just received 3 month supply from mail order pharmacy.  Continue  metformin 1,000 mg by mouth twice daily Discussed reducing sweetened foods/beverages  Hypertension - Condition stable. Not addressed this visit.: Blood pressure under good control. Blood pressure is at goal of <130/80 mmHg per 2017 AHA/ACC guidelines. Current medications: amlodipine 10 mg by mouth once daily, losartan-HCTZ 100-25mg  by mouth once daily, and clonidine 0.3 mg by mouth once daily Intolerances: none Taking medications as directed: yes Side effects thought to be attributed to current medication regimen: no Current home blood pressure: patient reports he has a home blood pressure cuff but has not been checking lately. Plans to start checking every other day. Continue current medications as above If further blood pressure lowering needed, consider increasing clonidine to twice daily  Hyperlipidemia - Goal on Track (progressing): YES.: Uncontrolled. LDL above goal of <70 due to very high risk given 10-year risk >20% per 2020 AACE/ACE guidelines. Triglycerides above goal of <150 per 2020 AACE/ACE guidelines. Current medications: pravastatin 80 mg by mouth once daily Intolerances: none Taking medications as directed: yes Side effects thought to be attributed to current medication regimen: no Encourage dietary reduction of high fat containing foods such as butter, nuts, bacon, egg yolks, etc. Recommend regular aerobic exercise Discussed need for and importance of continued work on weight loss Reviewed risks of hyperlipidemia, principles of treatment and consequences of untreated hyperlipidemia Patient counseled on smoking cessation Patient to have repeat lipid panel before next primary care provider visit in 1 month. If LDL remains elevated, consider increasing to high intensity statin or add ezetimibe 10 mg by mouth daily.  Tobacco Abuse (Current Smoker) - Goal on Track (progressing): YES.: Patient endorses that he currently smokes ~15 cigarettes per day Previous quit attempts:  Chantix (unable to tolerate due to nightmares and worsening mood) Patient reports he has never tried bupropion but prefers to try nicotine replacement therapy at this time to assist him in quitting Unfortunately his insurance does not cover nicotine patches, gum, or lozenge Advised patient to quit smoking and offered support Referred to a tobacco cessation program ([facility]) who may be apply to supply him with free samples of combination nicotine replacement therapy. Patient reports he has not had a chance to call yet but plans to call them today. Will follow-up on progress of this at next visit.   Patient Goals/Self-Care Activities Patient will:  Take medications as prescribed Check blood sugar  once daily, document, and provide at future appointments Check blood pressure at least once daily, document, and provide at future appointments Collaborate with provider on medication access solutions Engage in dietary modifications by fewer sweetened foods & beverages Contact [facility] [place] and work with them to help you stop smoking  Follow Up Plan: Telephone follow up appointment with care management team member scheduled for: 10/19/21       Medication Assistance: None required.  Patient affirms current coverage meets needs.  Patient's preferred pharmacy is:  [facility] [address], [place] - [place] [address] [place] [address] Phone: [phone] Fax: [phone]  [facility] - [place], [place] [address] Phone: [phone] Fax: [phone]  Follow Up:  Patient agrees to Care Plan and Follow-up.  Plan: Telephone follow up appointment with care management team member scheduled for:  10/19/21  Kennon Holter, PharmD, [place], [place] Clinical Pharmacist Practitioner [facility] [phone]

## 2021-10-05 ENCOUNTER — Encounter: Payer: Self-pay | Admitting: Family Medicine

## 2021-10-05 ENCOUNTER — Other Ambulatory Visit: Payer: Self-pay

## 2021-10-05 ENCOUNTER — Telehealth: Payer: Medicare HMO

## 2021-10-05 ENCOUNTER — Ambulatory Visit (INDEPENDENT_AMBULATORY_CARE_PROVIDER_SITE_OTHER): Payer: Medicare HMO | Admitting: Family Medicine

## 2021-10-05 DIAGNOSIS — R058 Other specified cough: Secondary | ICD-10-CM

## 2021-10-05 DIAGNOSIS — B369 Superficial mycosis, unspecified: Secondary | ICD-10-CM

## 2021-10-05 MED ORDER — PROMETHAZINE-DM 6.25-15 MG/5ML PO SYRP: ORAL_SOLUTION | ORAL | 0 refills | Status: DC

## 2021-10-05 MED ORDER — MONTELUKAST SODIUM 10 MG PO TABS: 10.0000 mg | ORAL_TABLET | Freq: Every day | ORAL | 1 refills | Status: DC

## 2021-10-05 MED ORDER — CLOTRIMAZOLE-BETAMETHASONE 1-0.05 % EX CREA: TOPICAL_CREAM | CUTANEOUS | 1 refills | Status: DC

## 2021-10-05 NOTE — Progress Notes (Signed)
Virtual Visit via Telephone Note  I connected with Dakota Mendez on 10/05/21 at  9:20 AM EST by telephone and verified that I am speaking with the correct person using two identifiers.  Location: Patient: home  Provider: office   I discussed the limitations, risks, security and privacy concerns of performing an evaluation and management service by telephone and the availability of in person appointments. I also discussed with the patient that there may be a patient responsible charge related to this service. The patient expressed understanding and agreed to proceed.   History of Present Illness: C/o lingering cough after infection, keeps him up at night, no sputum, fever or chills. Feels drainage down back of throat , no nasal discharge, ear pain or sore throat.  C/o itch on penis with jardiance   Observations/Objective: There were no vitals taken for this visit. Good communication with no confusion and intact memory. Alert and oriented x 3 Intermittent cough Assessment and Plan: Allergic cough singulair and phenergan DM prescribed  Dermatomycosis Clotrimazole/betamethasone twice daily to affected area ( penis)   Follow Up Instructions:    I discussed the assessment and treatment plan with the patient. The patient was provided an opportunity to ask questions and all were answered. The patient agreed with the plan and demonstrated an understanding of the instructions.   The patient was advised to call back or seek an in-person evaluation if the symptoms worsen or if the condition fails to improve as anticipated.  I provided 12 minutes of non-face-to-face time during this encounter.   Tula Nakayama, MD

## 2021-10-05 NOTE — Patient Instructions (Addendum)
F/U as before, call if you need me sooner  You are  treated for cough due to post nasal drainage with Singulair and Phenergan DM ([facility] phamacy)  Cream to be used twice daily as needed, is prescribed and will come through the mail  Thanks for choosing [facility], we consider it a privelige to serve you.

## 2021-10-11 DIAGNOSIS — E1159 Type 2 diabetes mellitus with other circulatory complications: Secondary | ICD-10-CM

## 2021-10-11 DIAGNOSIS — R058 Other specified cough: Secondary | ICD-10-CM | POA: Insufficient documentation

## 2021-10-11 DIAGNOSIS — E785 Hyperlipidemia, unspecified: Secondary | ICD-10-CM

## 2021-10-11 DIAGNOSIS — I10 Essential (primary) hypertension: Secondary | ICD-10-CM | POA: Diagnosis not present

## 2021-10-11 DIAGNOSIS — B369 Superficial mycosis, unspecified: Secondary | ICD-10-CM | POA: Insufficient documentation

## 2021-10-11 NOTE — Assessment & Plan Note (Signed)
singulair and phenergan DM prescribed

## 2021-10-11 NOTE — Assessment & Plan Note (Signed)
Clotrimazole/betamethasone twice daily to affected area ( penis)

## 2021-10-18 ENCOUNTER — Encounter (HOSPITAL_COMMUNITY): Payer: Self-pay

## 2021-10-18 NOTE — Progress Notes (Signed)
Attempted to reach patient regarding LDCT follow-up. Unable to reach patient at this time, no VM available.

## 2021-10-19 ENCOUNTER — Ambulatory Visit (INDEPENDENT_AMBULATORY_CARE_PROVIDER_SITE_OTHER): Payer: Medicare HMO | Admitting: Pharmacist

## 2021-10-19 DIAGNOSIS — Z720 Tobacco use: Secondary | ICD-10-CM

## 2021-10-19 DIAGNOSIS — I10 Essential (primary) hypertension: Secondary | ICD-10-CM

## 2021-10-19 DIAGNOSIS — E785 Hyperlipidemia, unspecified: Secondary | ICD-10-CM

## 2021-10-19 DIAGNOSIS — E1159 Type 2 diabetes mellitus with other circulatory complications: Secondary | ICD-10-CM

## 2021-10-19 NOTE — Chronic Care Management (AMB) (Signed)
Chronic Care Management Pharmacy Note  10/19/2021 Name:  Dakota Mendez MRN:  [id number] DOB:  [birth year]  Summary: General: Reminded patient of upcoming primary care provider appointment Reminded patient to get labs drawn at least 1 day before primary care provider appointment  Type 2 Diabetes Uncontrolled; Most recent A1c above goal of <7% per ADA guidelines Current glucose readings: fasting blood glucose: within goal range of 80-130 mg/dL per ADA guidelines. Patient reports fasting blood glucose over last few days 110-120s. Continue metformin 1,000 mg by mouth twice daily and Jardiance 10 mg by mouth daily Check A1c and CMP in 1 week  Hyperlipidemia Uncontrolled. LDL above goal of <70 due to very high risk given 10-year risk >20% per 2020 AACE/ACE guidelines. Triglycerides above goal of <150 per 2020 AACE/ACE guidelines. Continue pravastatin 80 mg by mouth daily for now. Check lipid panel in 1 week (at least 1 day before primary care provider visit). If LDL remains elevated, consider increasing to high intensity statin or add ezetimibe 10 mg by mouth daily.  Tobacco Abuse (Current Smoker) Patient endorses that he currently smokes ~12 cigarettes per day which is a slight decrease from last visit. Previous quit attempts: Chantix (unable to tolerate due to nightmares and worsening mood) Patient reports he has never tried bupropion but prefers to try nicotine replacement therapy at this time to assist him in quitting Unfortunately his insurance does not cover nicotine patches, gum, or lozenge Advised patient to quit smoking and offered support Referred to a tobacco cessation program [facility]) who may be apply to supply him with free samples of combination nicotine replacement therapy. Patient still reports he has not had a chance to call them but plans to call when he is ready to quit. Will follow-up on progress of this at next visit.  Subjective: Dakota Mendez is an 61 y.o.  year old male who is a primary patient of Lodema Hong, Milus Mallick, MD.  The CCM team was consulted for assistance with disease management and care coordination needs.    Engaged with patient by telephone for follow up visit in response to provider referral for pharmacy case management and/or care coordination services.   Consent to Services:  The patient was given information about Chronic Care Management services, agreed to services, and gave verbal consent prior to initiation of services.  Please see initial visit note for detailed documentation.   Patient Care Team: Kerri Perches, MD as PCP - General Gavin Pound, [facility] (Pharmacist)  Objective:  Lab Results  Component Value Date   CREATININE 1.13 09/19/2021   CREATININE 1.03 07/21/2021   CREATININE 1.05 01/31/2021    Lab Results  Component Value Date   HGBA1C 7.3 (H) 07/21/2021   Last diabetic Eye exam:  Lab Results  Component Value Date/Time   HMDIABEYEEXA No Retinopathy 04/05/2020 12:00 AM    Last diabetic Foot exam:  Lab Results  Component Value Date/Time   HMDIABFOOTEX yes 02/18/2010 12:00 AM        Component Value Date/Time   CHOL 165 10/15/2020 0805   TRIG 172 (H) 10/15/2020 0805   HDL 42 10/15/2020 0805   CHOLHDL 3.9 10/15/2020 0805   CHOLHDL 4.5 08/12/2019 0736   VLDL 63 (H) 02/13/2017 0720   LDLCALC 93 10/15/2020 0805   LDLCALC 115 (H) 08/12/2019 0736    Hepatic Function Latest Ref Rng & Units 10/15/2020 04/28/2020 08/12/2019  Total Protein 6.0 - 8.5 g/dL 6.9 6.9 6.9  Albumin 3.8 - 4.9 g/dL  4.3 4.1 -  AST 0 - 40 IU/L $Remov'13 10 10  'zMBOBx$ ALT 0 - 44 IU/L $Remov'10 13 10  'zxDqkH$ Alk Phosphatase 44 - 121 IU/L 75 80 -  Total Bilirubin 0.0 - 1.2 mg/dL 0.7 0.6 0.8  Bilirubin, Direct 0.0 - 0.3 mg/dL - - -    Lab Results  Component Value Date/Time   TSH 2.750 07/21/2021 08:27 AM   TSH 2.850 01/31/2021 09:53 AM    CBC Latest Ref Rng & Units 08/23/2020 08/12/2019 05/31/2018  WBC 3.4 - 10.8 x10E3/uL 12.1(H) 12.2(H) 11.5(H)   Hemoglobin 13.0 - 17.7 g/dL 13.6 14.1 14.6  Hematocrit 37.5 - 51.0 % 40.8 42.1 43.9  Platelets 150 - 450 x10E3/uL 393 370 379    Lab Results  Component Value Date/Time   VD25OH 36.3 07/21/2021 08:27 AM   VD25OH 43.0 01/31/2021 09:53 AM    Clinical ASCVD: No  The 10-year ASCVD risk score (Arnett DK, et al., 2019) is: 33.2%   Values used to calculate the score:     Age: 3 years     Sex: Male     Is Non-Hispanic African American: Yes     Diabetic: Yes     Tobacco smoker: Yes     Systolic Blood Pressure: 563 mmHg     Is BP treated: Yes     HDL Cholesterol: 42 mg/dL     Total Cholesterol: 165 mg/dL    Social History   Tobacco Use  Smoking Status Every Day   Packs/day: 0.50   Years: 40.00   Pack years: 20.00   Types: Cigarettes  Smokeless Tobacco Never  Tobacco Comments   1 PPD for 35 years   BP Readings from Last 3 Encounters:  08/10/21 123/85  07/22/21 130/84  01/31/21 120/82   Pulse Readings from Last 3 Encounters:  08/10/21 94  07/22/21 98  01/31/21 100   Wt Readings from Last 3 Encounters:  07/22/21 276 lb (125.2 kg)  01/31/21 277 lb (125.6 kg)  10/18/20 286 lb 6.4 oz (129.9 kg)    Assessment: Review of patient past medical history, allergies, medications, health status, including review of consultants reports, laboratory and other test data, was performed as part of comprehensive evaluation and provision of chronic care management services.   SDOH:  (Social Determinants of Health) assessments and interventions performed:    CCM Care Plan  No Known Allergies  Medications Reviewed Today     Reviewed by Beryle Lathe, [facility] (Pharmacist) on 10/19/21 at [phone]  Med List Status: <None>   Medication Order Taking? Sig Documenting Provider Last Dose Status Informant  Alcohol Swabs PADS [id number] Yes 1 each by Does not apply route as directed. Fayrene Helper, MD Taking Active   amLODipine (NORVASC) 10 MG tablet [id number] Yes TAKE 1 TABLET EVERY  DAY Fayrene Helper, MD Taking Active   Blood Glucose Monitoring Suppl (TRUE METRIX METER) w/Device KIT [id number] Yes USE AS DIRECTED Fayrene Helper, MD Taking Active   cloNIDine (CATAPRES) 0.3 MG tablet [id number] Yes TAKE 1 TABLET EVERY DAY Fayrene Helper, MD Taking Active   clotrimazole-betamethasone Donalynn Furlong) cream [id number] Yes Apply twice daily, as needed, to affected Ulyess Blossom, MD Taking Active   empagliflozin (JARDIANCE) 10 MG TABS tablet [id number] Yes Take 1 tablet (10 mg total) by mouth daily. Fayrene Helper, MD Taking Active   ibuprofen (IBU) 800 MG tablet [id number] No TAKE 1 TABLET TWICE DAILY AS NEEDED FOR PAIN  Patient not taking: Reported on 10/19/2021  Fayrene Helper, MD Not Taking Active            Med Note Vernell Barrier Aug 10, 2021  3:07 PM) Rarely takes  losartan-hydrochlorothiazide [facility]) 100-25 MG tablet [id number] Yes TAKE 1 TABLET EVERY DAY Fayrene Helper, MD Taking Active   metFORMIN (GLUCOPHAGE) 1000 MG tablet [id number] Yes Take 1 tablet (1,000 mg total) by mouth 2 (two) times daily with a meal. Fayrene Helper, MD Taking Active   potassium chloride (KLOR-CON) 10 MEQ tablet [id number] Yes TAKE 1 TABLET EVERY DAY Fayrene Helper, MD Taking Active   pravastatin (PRAVACHOL) 80 MG tablet [id number] Yes TAKE 1 TABLET EVERY DAY Fayrene Helper, MD Taking Active   TRUE METRIX BLOOD GLUCOSE TEST test strip [id number] Yes TEST BLOOD SUGAR  UP  TO FOUR TIMES DAILY AS DIRECTED Fayrene Helper, MD Taking Active   TRUEplus Lancets 33G [place] [id number] Yes 1 each by Does not apply route as directed. Fayrene Helper, MD Taking Active             Patient Active Problem List   Diagnosis Date Noted   Allergic cough 10/11/2021   Dermatomycosis 10/11/2021   Tobacco abuse 01/30/2020   Special screening for malignant neoplasm of intestine 03/29/2017   Obstructive sleep apnea 04/18/2013   Type 2 diabetes  mellitus with vascular disease ([place]) 11/10/2009   Erectile dysfunction due to diseases classified elsewhere 11/10/2009   Hyperlipidemia LDL goal <100 02/11/2008   Morbid obesity ([place]) 02/11/2008   Essential hypertension 02/11/2008    Immunization History  Administered Date(s) Administered   Influenza Whole 10/11/2007, 05/17/2011   Influenza,inj,Quad PF,6+ Mos 06/13/2013, 06/18/2014, 07/22/2015, 06/01/2016, 07/25/2017, 05/30/2018, 06/09/2019, 04/29/2020, 06/13/2021   Moderna Sars-Covid-2 Vaccination 12/05/2019, 01/06/2020, 07/26/2020, 01/28/2021   Pneumococcal Conjugate-13 12/15/2014   Pneumococcal Polysaccharide-23 04/05/2011, 06/01/2016   Td 09/21/2006    Conditions to be addressed/monitored: HTN, HLD, DMII, and Tobacco Use  Care Plan : Medication Management  Updates made by Beryle Lathe, [place] since 10/19/2021 12:00 AM     Problem: T2DM, HTN, HLD, Tobacco Use   Priority: High  Onset Date: 08/10/2021     Long-Range Goal: Disease Progression Prevention   Start Date: 08/10/2021  Expected End Date: 11/08/2021  Recent Progress: On track  Priority: High  Note:   Current Barriers:  Unable to independently monitor therapeutic efficacy Unable to achieve control of diabetes, hyperlipidemia, and smoking cessation  Pharmacist Clinical Goal(s):  Through collaboration with PharmD and provider, patient will: Achieve adherence to monitoring guidelines and medication adherence to achieve therapeutic efficacy Achieve control of diabetes, hyperlipidemia, and smoking cessation as evidenced by improved fasting blood sugar, improved A1c, improved LDL, improved triglycerides, and complete smoking cessation   Interventions: 1:1 collaboration with Fayrene Helper, MD regarding development and update of comprehensive plan of care as evidenced by provider attestation and co-signature Inter-disciplinary care team collaboration (see longitudinal plan of care) Comprehensive medication  review performed; medication list updated in electronic medical record  Type 2 Diabetes - Goal on Track (progressing): YES.: Uncontrolled; Most recent A1c above goal of <7% per ADA guidelines Current medications: metformin 1,000 mg by mouth twice daily with meals and empagliflozin (Jardiance) 10 mg by mouth once daily Intolerances: none Taking medications as directed: yes Side effects thought to be attributed to current medication regimen:  none currently; however, patient previously reported potential yeast infection which cleared up quickly with use of clotrimazole cream. Will continue to monitor for reoccurrence Denies  recent hypoglycemic symptoms (shaky/jittery) since discontinuing glipizide Hypoglycemia prevention: not indicated at this time Current meal patterns: not discussed today Current exercise: not discussed today On a statin: yes On aspirin 81 mg daily: no Last microalbumin/creatinine ratio: 34 (08/23/20); on an ACEi/ARB: yes Current glucose readings: fasting blood glucose: within goal range of 80-130 mg/dL per ADA guidelines. Patient reports fasting blood glucose over last few days 110-120s. Patient instructed to check blood glucose once daily (fasting) Discussed management of hypoglycemia. If blood sugar <70 at any time, treat with simple sugar such as 1/2 cup juice or regular soda or 3-4 glucose tablets. Recheck blood sugar in 15 minutes and repeat if blood sugar remains <70.  Continue metformin 1,000 mg by mouth twice daily and Jardiance 10 mg by mouth daily Check A1c and CMP in 1 week (at least 1 day before primary care provider visit)  Hypertension - Condition stable. Not addressed this visit.: Blood pressure under good control. Blood pressure is at goal of <130/80 mmHg per 2017 AHA/ACC guidelines. Current medications: amlodipine 10 mg by mouth once daily, losartan-HCTZ 100-25mg  by mouth once daily, and clonidine 0.3 mg by mouth once daily Intolerances: none Taking  medications as directed: yes Side effects thought to be attributed to current medication regimen: no Current home blood pressure: patient reports he has a home blood pressure cuff but has not been checking lately. Continue current medications as above If further blood pressure lowering needed, consider increasing clonidine to twice daily  Hyperlipidemia - Goal on Track (progressing): YES.: Uncontrolled. LDL above goal of <70 due to very high risk given 10-year risk >20% per 2020 AACE/ACE guidelines. Triglycerides above goal of <150 per 2020 AACE/ACE guidelines. Current medications: pravastatin 80 mg by mouth once daily Intolerances: none Taking medications as directed: yes Side effects thought to be attributed to current medication regimen: no Encourage dietary reduction of high fat containing foods such as butter, nuts, bacon, egg yolks, etc. Recommend regular aerobic exercise Discussed need for and importance of continued work on weight loss Reviewed risks of hyperlipidemia, principles of treatment and consequences of untreated hyperlipidemia Patient counseled on smoking cessation Continue pravastatin 80 mg by mouth daily for now. Check lipid panel in 1 week (at least 1 day before primary care provider visit). If LDL remains elevated, consider increasing to high intensity statin or add ezetimibe 10 mg by mouth daily.  Tobacco Abuse (Current Smoker) - Goal on Track (progressing): YES.: Patient endorses that he currently smokes ~12 cigarettes per day which is a slight decrease from last visit. Previous quit attempts: Chantix (unable to tolerate due to nightmares and worsening mood) Patient reports he has never tried bupropion but prefers to try nicotine replacement therapy at this time to assist him in quitting Unfortunately his insurance does not cover nicotine patches, gum, or lozenge Advised patient to quit smoking and offered support Referred to a tobacco cessation program [facility])  who may be apply to supply him with free samples of combination nicotine replacement therapy. Patient still reports he has not had a chance to call them but plans to call when he is ready to quit. Will follow-up on progress of this at next visit.   Patient Goals/Self-Care Activities Patient will:  Take medications as prescribed Check blood sugar once daily, document, and provide at future appointments Check blood pressure at least once daily, document, and provide at future appointments Collaborate with provider on medication access solutions Engage in dietary modifications by fewer sweetened foods & beverages Contact [facility]  [place] and work with them to help you stop smoking  Follow Up Plan: Telephone follow up appointment with care management team member scheduled for: 12/20/21       Medication Assistance: None required.  Patient affirms current coverage meets needs.  Patient's preferred pharmacy is:  [facility] [address], [place] - [place] [address] [place] [address] Phone: [phone] Fax: [phone]  [facility] - [place] [address] Phone: [phone] Fax: [phone]  Follow Up:  Patient agrees to Care Plan and Follow-up.  Plan: Telephone follow up appointment with care management team member scheduled for:  12/20/21  Kennon Holter, PharmD, [place], [place] Clinical Pharmacist Practitioner [facility] [phone]

## 2021-10-19 NOTE — Patient Instructions (Signed)
Dakota Mendez,  It was great to talk to you today!  Please call me with any questions or concerns.  Visit Information  Following are the goals we discussed today:   Goals Addressed             This Visit's Progress    Medication Management       Patient Goals/Self-Care Activities Patient will:  Take medications as prescribed Check blood sugar once daily, document, and provide at future appointments Check blood pressure at least once daily, document, and provide at future appointments Collaborate with provider on medication access solutions Engage in dietary modifications by fewer sweetened foods & beverages Contact [facility] [place] and work with them to help you stop smokingd work with them to help you stop smoking          Follow-up plan: Telephone follow up appointment with care management team member scheduled for:  12/20/21  Patient verbalizes understanding of instructions and care plan provided today and agrees to view in [place]. Active MyChart status confirmed with patient.    Please call the care guide team at [phone] if you need to cancel or reschedule your appointment.   Kennon Holter, PharmD, [place], CPP Clinical Pharmacist Practitioner [facility] [phone]

## 2021-10-27 DIAGNOSIS — E1159 Type 2 diabetes mellitus with other circulatory complications: Secondary | ICD-10-CM | POA: Diagnosis not present

## 2021-10-28 ENCOUNTER — Other Ambulatory Visit: Payer: Self-pay

## 2021-10-28 ENCOUNTER — Ambulatory Visit (INDEPENDENT_AMBULATORY_CARE_PROVIDER_SITE_OTHER): Payer: Medicare HMO | Admitting: Family Medicine

## 2021-10-28 ENCOUNTER — Encounter: Payer: Self-pay | Admitting: Family Medicine

## 2021-10-28 VITALS — BP 137/85 | HR 96 | Ht 72.0 in | Wt 261.0 lb

## 2021-10-28 DIAGNOSIS — E1159 Type 2 diabetes mellitus with other circulatory complications: Secondary | ICD-10-CM

## 2021-10-28 DIAGNOSIS — I10 Essential (primary) hypertension: Secondary | ICD-10-CM

## 2021-10-28 DIAGNOSIS — N521 Erectile dysfunction due to diseases classified elsewhere: Secondary | ICD-10-CM

## 2021-10-28 DIAGNOSIS — Z599 Problem related to housing and economic circumstances, unspecified: Secondary | ICD-10-CM | POA: Diagnosis not present

## 2021-10-28 DIAGNOSIS — E559 Vitamin D deficiency, unspecified: Secondary | ICD-10-CM

## 2021-10-28 DIAGNOSIS — Z720 Tobacco use: Secondary | ICD-10-CM

## 2021-10-28 DIAGNOSIS — F17210 Nicotine dependence, cigarettes, uncomplicated: Secondary | ICD-10-CM

## 2021-10-28 DIAGNOSIS — E785 Hyperlipidemia, unspecified: Secondary | ICD-10-CM | POA: Diagnosis not present

## 2021-10-28 LAB — CMP14+EGFR
ALT: 12 IU/L (ref 0–44)
AST: 14 IU/L (ref 0–40)
Albumin/Globulin Ratio: 1.6 (ref 1.2–2.2)
Albumin: 4.2 g/dL (ref 3.8–4.9)
Alkaline Phosphatase: 79 IU/L (ref 44–121)
BUN/Creatinine Ratio: 14 (ref 9–20)
BUN: 15 mg/dL (ref 6–24)
Bilirubin Total: 0.9 mg/dL (ref 0.0–1.2)
CO2: 24 mmol/L (ref 20–29)
Calcium: 9.5 mg/dL (ref 8.7–10.2)
Chloride: 100 mmol/L (ref 96–106)
Creatinine, Ser: 1.07 mg/dL (ref 0.76–1.27)
Globulin, Total: 2.6 g/dL (ref 1.5–4.5)
Glucose: 123 mg/dL — ABNORMAL HIGH (ref 70–99)
Potassium: 4.1 mmol/L (ref 3.5–5.2)
Sodium: 140 mmol/L (ref 134–144)
Total Protein: 6.8 g/dL (ref 6.0–8.5)
eGFR: 80 mL/min/{1.73_m2} (ref >59)

## 2021-10-28 LAB — HEMOGLOBIN A1C
Est. average glucose Bld gHb Est-mCnc: 151 mg/dL
Hgb A1c MFr Bld: 6.9 % — ABNORMAL HIGH (ref 4.8–5.6)

## 2021-10-28 LAB — LIPID PANEL
Chol/HDL Ratio: 3.1 ratio (ref 0.0–5.0)
Cholesterol, Total: 159 mg/dL (ref 100–199)
HDL: 52 mg/dL (ref >39)
LDL Chol Calc (NIH): 86 mg/dL (ref 0–99)
Triglycerides: 117 mg/dL (ref 0–149)
VLDL Cholesterol Cal: 21 mg/dL (ref 5–40)

## 2021-10-28 MED ORDER — TADALAFIL 20 MG PO TABS
10.0000 mg | ORAL_TABLET | ORAL | 11 refills | Status: DC | PRN
Start: 2021-10-28 — End: 2022-09-21

## 2021-10-28 NOTE — Progress Notes (Signed)
Dakota Mendez     MRN: [id number]      DOB: 1961/02/19   HPI Dakota Mendez is here for follow up and re-evaluation of chronic medical conditions, medication management and review of any available recent lab and radiology data.  Preventive health is updated, specifically  Cancer screening and Immunization.   Questions or concerns regarding consultations or procedures which the PT has had in the interim are  addressed. The PT denies any adverse reactions to current medications since the last visit.  There are no new concerns.  There are no specific complaints   ROS Denies recent fever or chills. Denies sinus pressure, nasal congestion, ear pain or sore throat. Denies chest congestion, productive cough or wheezing. Denies chest pains, palpitations and leg swelling Denies abdominal pain, nausea, vomiting,diarrhea or constipation.   Denies dysuria, frequency, hesitancy or incontinence. Denies joint pain, swelling and limitation in mobility. Denies headaches, seizures, numbness, or tingling. Denies depression, anxiety or insomnia. Denies skin break down or rash.   PE  BP 137/85 (BP Location: Right Arm, Patient Position: Sitting, Cuff Size: Large)    Pulse 96    Ht 6' (1.829 m)    Wt 261 lb (118.4 kg)    SpO2 95%    BMI 35.40 kg/m   Patient alert and oriented and in no cardiopulmonary distress.  HEENT: No facial asymmetry, EOMI,     Neck supple .  Chest: Clear to auscultation bilaterally.  CVS: S1, S2 no murmurs, no S3.Regular rate.  ABD: Soft non tender.   Ext: No edema  MS: Adequate ROM spine, shoulders, hips and knees.  Skin: Intact, no ulcerations or rash noted.  Psych: Good eye contact, normal affect. Memory intact not anxious or depressed appearing.  CNS: CN 2-12 intact, power,  normal throughout.no focal deficits noted.   Assessment & Plan  Type 2 diabetes mellitus with vascular disease ([place]) Improved DASH diet and commitment to daily physical activity for a  minimum of 30 minutes discussed and encouraged, as a part of hypertension management. The importance of attaining a healthy weight is also discussed.  BP/Weight 10/28/2021 08/10/2021 07/22/2021 01/31/2021 10/18/2020 08/24/2020 0/92/3300  Systolic BP 762 263 335 456 256 389 373  Diastolic BP 85 85 84 82 96 88 80  Wt. (Lbs) 261 - 276 277 286.4 288 296  BMI 35.4 - 37.43 37.57 38.84 39.06 40.14       Tobacco abuse Asked:confirms currently smokes cigarettes  12/ day Assess: Unwilling to set a quit date, but is cutting back Advise: needs to QUIT to reduce risk of cancer, cardio and cerebrovascular disease Assist: counseled for 5 minutes and literature provided Arrange: follow up in 2 to 4 months   Morbid obesity ([place]) Improved  Patient re-educated about  the importance of commitment to a  minimum of 150 minutes of exercise per week as able.  The importance of healthy food choices with portion control discussed, as well as eating regularly and within a 12 hour window most days. The need to choose "clean , green" food 50 to 75% of the time is discussed, as well as to make water the primary drink and set a goal of 64 ounces water daily.    Weight /BMI 10/28/2021 07/22/2021 01/31/2021  WEIGHT 261 lb 276 lb 277 lb  HEIGHT 6\' 0"  6\' 0"  6\' 0"   BMI 35.4 kg/m2 37.43 kg/m2 37.57 kg/m2      Essential hypertension Controlled, no change in medication DASH diet and commitment to daily  physical activity for a minimum of 30 minutes discussed and encouraged, as a part of hypertension management. The importance of attaining a healthy weight is also discussed.  BP/Weight 10/28/2021 08/10/2021 07/22/2021 01/31/2021 10/18/2020 08/24/2020 9/41/7408  Systolic BP 144 818 563 149 702 637 858  Diastolic BP 85 85 84 82 96 88 80  Wt. (Lbs) 261 - 276 277 286.4 288 296  BMI 35.4 - 37.43 37.57 38.84 39.06 40.14       Hyperlipidemia LDL goal <100 Hyperlipidemia:Low fat diet discussed and encouraged.   Lipid  Panel  Lab Results  Component Value Date   CHOL 159 10/27/2021   HDL 52 10/27/2021   LDLCALC 86 10/27/2021   TRIG 117 10/27/2021   CHOLHDL 3.1 10/27/2021     Controlled, no change in medication   Erectile dysfunction due to diseases classified elsewhere Start cialis

## 2021-10-28 NOTE — Assessment & Plan Note (Signed)
Improved  Patient re-educated about  the importance of commitment to a  minimum of 150 minutes of exercise per week as able.  The importance of healthy food choices with portion control discussed, as well as eating regularly and within a 12 hour window most days. The need to choose "clean , green" food 50 to 75% of the time is discussed, as well as to make water the primary drink and set a goal of 64 ounces water daily.    Weight /BMI 10/28/2021 07/22/2021 01/31/2021  WEIGHT 261 lb 276 lb 277 lb  HEIGHT 6\' 0"  6\' 0"  6\' 0"   BMI 35.4 kg/m2 37.43 kg/m2 37.57 kg/m2

## 2021-10-28 NOTE — Assessment & Plan Note (Signed)
Asked:confirms currently smokes cigarettes  12/ day Assess: Unwilling to set a quit date, but is cutting back Advise: needs to QUIT to reduce risk of cancer, cardio and cerebrovascular disease Assist: counseled for 5 minutes and literature provided Arrange: follow up in 2 to 4 months

## 2021-10-28 NOTE — Assessment & Plan Note (Signed)
Start cialis

## 2021-10-28 NOTE — Assessment & Plan Note (Signed)
Hyperlipidemia:Low fat diet discussed and encouraged.   Lipid Panel  Lab Results  Component Value Date   CHOL 159 10/27/2021   HDL 52 10/27/2021   LDLCALC 86 10/27/2021   TRIG 117 10/27/2021   CHOLHDL 3.1 10/27/2021     Controlled, no change in medication

## 2021-10-28 NOTE — Assessment & Plan Note (Signed)
Controlled, no change in medication DASH diet and commitment to daily physical activity for a minimum of 30 minutes discussed and encouraged, as a part of hypertension management. The importance of attaining a healthy weight is also discussed.  BP/Weight 10/28/2021 08/10/2021 07/22/2021 01/31/2021 10/18/2020 08/24/2020 0/93/8182  Systolic BP 993 716 967 893 810 175 102  Diastolic BP 85 85 84 82 96 88 80  Wt. (Lbs) 261 - 276 277 286.4 288 296  BMI 35.4 - 37.43 37.57 38.84 39.06 40.14

## 2021-10-28 NOTE — Patient Instructions (Addendum)
F/U 18 weeks, call if you need me sooner  EXCELLENT labs and weight loss, keep it up, keep doing the GREAT things you are doing  Need chest scan please follow up on bills , and let me know when you decide to  get this you need this    Please get shingrix vaccine   CBC, TSH, vit D chem7 and eGFr and hBA1C 5 days before next appt  Cialis is prescribed , please give coupon  It is important that you exercise regularly at least 30 minutes 5 times a week. If you develop chest pain, have severe difficulty breathing, or feel very tired, stop exercising immediately and seek medical attention   Think about what you will eat, plan ahead. Choose " clean, green, fresh or frozen" over canned, processed or packaged foods which are more sugary, salty and fatty. 70 to 75% of food eaten should be vegetables and fruit. Three meals at set times with snacks allowed between meals, but they must be fruit or vegetables. Aim to eat over a 12 hour period , example 7 am to 7 pm, and STOP after  your last meal of the day. Drink water,generally about 64 ounces per day, no other drink is as healthy. Fruit juice is best enjoyed in a healthy way, by EATING the fruit. Thanks for choosing [facility], we consider it a privelige to serve you.

## 2021-10-28 NOTE — Assessment & Plan Note (Signed)
Improved DASH diet and commitment to daily physical activity for a minimum of 30 minutes discussed and encouraged, as a part of hypertension management. The importance of attaining a healthy weight is also discussed.  BP/Weight 10/28/2021 08/10/2021 07/22/2021 01/31/2021 10/18/2020 08/24/2020 5/92/9244  Systolic BP 628 638 177 116 579 038 333  Diastolic BP 85 85 84 82 96 88 80  Wt. (Lbs) 261 - 276 277 286.4 288 296  BMI 35.4 - 37.43 37.57 38.84 39.06 40.14

## 2021-11-01 DIAGNOSIS — Z0100 Encounter for examination of eyes and vision without abnormal findings: Secondary | ICD-10-CM | POA: Diagnosis not present

## 2021-11-01 DIAGNOSIS — Z7984 Long term (current) use of oral hypoglycemic drugs: Secondary | ICD-10-CM | POA: Diagnosis not present

## 2021-11-01 DIAGNOSIS — E119 Type 2 diabetes mellitus without complications: Secondary | ICD-10-CM | POA: Diagnosis not present

## 2021-11-01 DIAGNOSIS — H524 Presbyopia: Secondary | ICD-10-CM | POA: Diagnosis not present

## 2021-11-01 DIAGNOSIS — H5203 Hypermetropia, bilateral: Secondary | ICD-10-CM | POA: Diagnosis not present

## 2021-11-02 ENCOUNTER — Telehealth: Payer: Self-pay | Admitting: *Deleted

## 2021-11-02 NOTE — Chronic Care Management (AMB) (Signed)
°  Care Management   Note  11/02/2021 Name: Dakota Mendez MRN: [id number] DOB: 1961/07/05  Dakota Mendez is a 61 y.o. year old male who is a primary care patient of Fayrene Helper, MD and is actively engaged with the care management team. I reached out to Hollace Kinnier by phone today to assist with scheduling an initial visit with the Licensed Clinical Education officer, museum. After speaking with patient it was determined that social worker was no longer needed because the patient has not received another bill after speaking with billing. Patient request to schedule CT Lung CA Screening will route message to Dr. Moshe Cipro to order.  Follow up plan: The patient has been provided with contact information for the care management team and has been advised to call with any health related questions or concerns.   [facility]  Direct Dial: [phone]

## 2021-11-08 DIAGNOSIS — E785 Hyperlipidemia, unspecified: Secondary | ICD-10-CM

## 2021-11-08 DIAGNOSIS — I10 Essential (primary) hypertension: Secondary | ICD-10-CM | POA: Diagnosis not present

## 2021-11-08 DIAGNOSIS — E1159 Type 2 diabetes mellitus with other circulatory complications: Secondary | ICD-10-CM | POA: Diagnosis not present

## 2021-11-23 ENCOUNTER — Other Ambulatory Visit: Payer: Self-pay | Admitting: Family Medicine

## 2021-11-23 DIAGNOSIS — I10 Essential (primary) hypertension: Secondary | ICD-10-CM

## 2021-11-26 DIAGNOSIS — R69 Illness, unspecified: Secondary | ICD-10-CM | POA: Diagnosis not present

## 2021-11-30 ENCOUNTER — Ambulatory Visit (INDEPENDENT_AMBULATORY_CARE_PROVIDER_SITE_OTHER): Payer: Medicare HMO | Admitting: Pharmacist

## 2021-11-30 DIAGNOSIS — E1159 Type 2 diabetes mellitus with other circulatory complications: Secondary | ICD-10-CM

## 2021-11-30 DIAGNOSIS — Z720 Tobacco use: Secondary | ICD-10-CM

## 2021-11-30 DIAGNOSIS — I10 Essential (primary) hypertension: Secondary | ICD-10-CM

## 2021-11-30 DIAGNOSIS — E785 Hyperlipidemia, unspecified: Secondary | ICD-10-CM

## 2021-11-30 NOTE — Patient Instructions (Signed)
Dakota Mendez, ? ?It was great to talk to you today! ? ?Please call me with any questions or concerns. ? ?Visit Information ? ?Following are the goals we discussed today:  ? Goals Addressed   ? ?  ?  ?  ?  ? This Visit's Progress  ?  Medication Management     ?  Patient Goals/Self-Care Activities ?Patient will:  ?Take medications as prescribed ?Check blood sugar once daily, document, and provide at future appointments ?Check blood pressure at least once daily, document, and provide at future appointments ?Collaborate with provider on medication access solutions ?Engage in dietary modifications by fewer sweetened foods & beverages ?Contact [facility] and work with them to help you stop smokingd work with them to help you stop smoking ? ? ?  ? ?  ?  ? ?Follow-up plan: Next PCP appointment scheduled for: 03/03/22 ? ?Patient verbalizes understanding of instructions and care plan provided today and agrees to view in [place]. Active MyChart status confirmed with patient.   ? ?Please call the care guide team at [phone] if you need to cancel or reschedule your appointment.  ? ?Kennon Holter, PharmD, BCACP, CPP ?Clinical Pharmacist Practitioner ?[facility] ?[phone]  ?

## 2021-11-30 NOTE — Chronic Care Management (AMB) (Signed)
? ? ?Chronic Care Management ?Pharmacy Note ? ?11/30/2021 ?Name:  Dakota Mendez MRN:  [id number] DOB:  [birth year] ? ?Summary: ?Type 2 Diabetes ?Controlled; Most recent A1c at goal of <7% per ADA guidelines ?Current medications: metformin 1,000 mg by mouth twice daily with meals and empagliflozin (Jardiance) 10 mg by mouth once daily ?Intolerances:  patient again reports redness and irritation of genitalia ?Taking medications as directed: no, patient has stopped Jardiance for about 1 week now due to adverse reaction ?Side effects thought to be attributed to current medication regimen:  potential yeast infection. Patient has discontinued Jardiance and used clotrimazole cream to treat with resolution. ?Current glucose readings: fasting blood glucose: within goal range of 80-130 mg/dL per ADA guidelines. Patient reports stable fasting blood glucose over last few days only on metformin (110-120s). ?Continue metformin 1,000 mg by mouth twice daily  ?Discontinue Jardiance 10 mg by mouth daily due to two separate occasions of possible yeast infections. Added to medication allergy list.  ?Discussed GLP-1 agonists briefly today. Patient prefers to work on diet/exercise and only take metformin for now but may be reasonable to consider Rybelsus or Mounjaro or Ozempic as additional pharmacologic agents to assist patient with blood glucose management as well as weight loss.  ? ?Hyperlipidemia - Goal on Track (progressing): YES.: ?Uncontrolled. LDL above goal of <70 due to very high risk given 10-year risk >20% per 2020 AACE/ACE guidelines. ?Continue pravastatin 80 mg by mouth daily for now. Consider increasing to high intensity statin or add ezetimibe 10 mg by mouth daily to achieve LDL goal. ? ?Subjective: ?Dakota Mendez is an 61 y.o. year old male who is a primary patient of Dakota Helper, MD.  The CCM team was consulted for assistance with disease management and care coordination needs.   ? ?Engaged with patient by  telephone for follow up visit in response to provider referral for pharmacy case management and/or care coordination services.  ? ?Consent to Services:  ?The patient was given information about Chronic Care Management services, agreed to services, and gave verbal consent prior to initiation of services.  Please see initial visit note for detailed documentation.  ? ?Patient Care Team: ?Dakota Helper, MD as PCP - General ?Dakota Mendez, [facility] (Pharmacist) ? ?Objective: ? ?Lab Results  ?Component Value Date  ? CREATININE 1.07 10/27/2021  ? CREATININE 1.13 09/19/2021  ? CREATININE 1.03 07/21/2021  ? ? ?Lab Results  ?Component Value Date  ? HGBA1C 6.9 (H) 10/27/2021  ? ?Last diabetic Eye exam:  ?Lab Results  ?Component Value Date/Time  ? HMDIABEYEEXA No Retinopathy 04/05/2020 12:00 AM  ?  ?Last diabetic Foot exam:  ?Lab Results  ?Component Value Date/Time  ? HMDIABFOOTEX yes 02/18/2010 12:00 AM  ?  ? ?   ?Component Value Date/Time  ? CHOL 159 10/27/2021 0741  ? TRIG 117 10/27/2021 0741  ? HDL 52 10/27/2021 0741  ? CHOLHDL 3.1 10/27/2021 0741  ? CHOLHDL 4.5 08/12/2019 0736  ? VLDL 63 (H) 02/13/2017 0720  ? [place] 86 10/27/2021 0741  ? [place] 115 (H) 08/12/2019 0736  ? ? ? ?  Latest Ref Rng & Units 10/27/2021  ?  7:41 AM 10/15/2020  ?  8:05 AM 04/28/2020  ?  7:45 AM  ?Hepatic Function  ?Total Protein 6.0 - 8.5 g/dL 6.8   6.9   6.9    ?Albumin 3.8 - 4.9 g/dL 4.2   4.3   4.1    ?AST 0 - 40 IU/L 14  13   10    ?ALT 0 - 44 IU/L _0 ?Alk Phosphatase 44 - 121 IU/L 79   75   80    ?Total Bilirubin 0.0 - 1.2 mg/dL 0.9   0.7   0.6    ? ? ?Lab Results  ?Component Value Date/Time  ? TSH 2.750 07/21/2021 08:27 AM  ? TSH 2.850 01/31/2021 09:53 AM  ? ? ? ?  Latest Ref Rng & Units 08/23/2020  ?  8:03 AM 08/12/2019  ?  7:36 AM 05/31/2018  ?  7:08 AM  ?CBC  ?WBC 3.4 - 10.8 x10E3/uL 12.1   12.2   11.5    ?Hemoglobin 13.0 - 17.7 g/dL 13.6   14.1   14.6    ?Hematocrit 37.5 - 51.0 % 40.8   42.1   43.9    ?Platelets 150 -  450 x10E3/uL 393   370   379    ? ? ?Lab Results  ?Component Value Date/Time  ? VD25OH 36.3 07/21/2021 08:27 AM  ? VD25OH 43.0 01/31/2021 09:53 AM  ? ? ?Clinical ASCVD: No  ?The 10-year ASCVD risk score (Arnett DK, et al., 2019) is: 36.8% ?  Values used to calculate the score: ?    Age: 25 years ?    Sex: Male ?    Is Non-Hispanic African American: Yes ?    Diabetic: Yes ?    Tobacco smoker: Yes ?    Systolic Blood Pressure: 606 mmHg ?    Is BP treated: Yes ?    HDL Cholesterol: 52 mg/dL ?    Total Cholesterol: 159 mg/dL   ? ?Social History  ? ?Tobacco Use  ?Smoking Status Every Day  ? Packs/day: 0.50  ? Years: 40.00  ? Pack years: 20.00  ? Types: Cigarettes  ?Smokeless Tobacco Never  ?Tobacco Comments  ? 1 PPD for 35 years  ? ?BP Readings from Last 3 Encounters:  ?10/28/21 137/85  ?08/10/21 123/85  ?07/22/21 130/84  ? ?Pulse Readings from Last 3 Encounters:  ?10/28/21 96  ?08/10/21 94  ?07/22/21 98  ? ?Wt Readings from Last 3 Encounters:  ?10/28/21 261 lb (118.4 kg)  ?07/22/21 276 lb (125.2 kg)  ?01/31/21 277 lb (125.6 kg)  ? ? ?Assessment: Review of patient past medical history, allergies, medications, health status, including review of consultants reports, laboratory and other test data, was performed as part of comprehensive evaluation and provision of chronic care management services.  ? ?SDOH:  (Social Determinants of Health) assessments and interventions performed:  ? ? ?CCM Care Plan ? ?Allergies  ?Allergen Reactions  ? Jardiance [Empagliflozin] Other (See Comments)  ?  Redness and irritation of genitals  ? ? ?Medications Reviewed Today   ? ? Reviewed by Dakota Mendez, [facility] (Pharmacist) on 11/30/21 at 1350  Med List Status: <None>  ? ?Medication Order Taking? Sig Documenting Provider Last Dose Status Informant  ?Alcohol Swabs PADS [id number] Yes 1 each by Does not apply route as directed. Dakota Helper, MD Taking Active   ?amLODipine (NORVASC) 10 MG tablet [id number] Yes TAKE 1 TABLET EVERY DAY  Dakota Helper, MD Taking Active   ?Blood Glucose Monitoring Suppl (TRUE METRIX METER) w/Device KIT [id number] Yes USE AS DIRECTED Dakota Helper, MD Taking Active   ?cloNIDine (CATAPRES) 0.3 MG tablet [id number] Yes TAKE 1 TABLET EVERY DAY Dakota Helper, MD Taking Active   ?clotrimazole-betamethasone (LOTRISONE) cream [id number] Yes Apply twice daily, as  needed, to affected Ulyess Blossom, MD Taking Active   ?ibuprofen (IBU) 800 MG tablet [id number] Yes TAKE 1 TABLET TWICE DAILY AS NEEDED FOR PAIN Dakota Helper, MD Taking Active   ?losartan-hydrochlorothiazide [facility]) 100-25 MG tablet [id number] Yes TAKE 1 TABLET EVERY DAY Dakota Helper, MD Taking Active   ?metFORMIN (GLUCOPHAGE) 1000 MG tablet [id number] Yes Take 1 tablet (1,000 mg total) by mouth 2 (two) times daily with a meal. Dakota Helper, MD Taking Active   ?potassium chloride (KLOR-CON) 10 MEQ tablet [id number] Yes TAKE 1 TABLET EVERY DAY Dakota Helper, MD Taking Active   ?pravastatin (PRAVACHOL) 80 MG tablet [id number] Yes TAKE 1 TABLET EVERY DAY Dakota Helper, MD Taking Active   ?tadalafil (CIALIS) 20 MG tablet [id number] Yes Take 0.5-1 tablets (10-20 mg total) by mouth every other day as needed for erectile dysfunction. Dakota Helper, MD Taking Active   ?TRUE METRIX BLOOD GLUCOSE TEST test strip [id number] Yes TEST BLOOD SUGAR  UP  TO FOUR TIMES DAILY AS DIRECTED Dakota Helper, MD Taking Active   ?TRUEplus Lancets [place] [id number] Yes 1 each by Does not apply route as directed. Dakota Helper, MD Taking Active   ? ?  ?  ? ?  ? ? ?Patient Active Problem List  ? Diagnosis Date Noted  ? Allergic cough 10/11/2021  ? Dermatomycosis 10/11/2021  ? Tobacco abuse 01/30/2020  ? Special screening for malignant neoplasm of intestine 03/29/2017  ? Obstructive sleep apnea 04/18/2013  ? Type 2 diabetes mellitus with vascular disease ([place]) 11/10/2009  ? Erectile dysfunction due to diseases classified  elsewhere 11/10/2009  ? Hyperlipidemia LDL goal <100 02/11/2008  ? Morbid obesity ([place]) 02/11/2008  ? Essential hypertension 02/11/2008  ? ? ?Immunization History  ?Administered Date(s) Administered  ? Influ

## 2021-12-09 DIAGNOSIS — E1159 Type 2 diabetes mellitus with other circulatory complications: Secondary | ICD-10-CM | POA: Diagnosis not present

## 2021-12-09 DIAGNOSIS — E785 Hyperlipidemia, unspecified: Secondary | ICD-10-CM

## 2021-12-09 DIAGNOSIS — I10 Essential (primary) hypertension: Secondary | ICD-10-CM

## 2021-12-20 ENCOUNTER — Telehealth: Payer: Medicare HMO

## 2022-01-09 ENCOUNTER — Other Ambulatory Visit: Payer: Self-pay | Admitting: Family Medicine

## 2022-03-01 DIAGNOSIS — I10 Essential (primary) hypertension: Secondary | ICD-10-CM | POA: Diagnosis not present

## 2022-03-01 DIAGNOSIS — E559 Vitamin D deficiency, unspecified: Secondary | ICD-10-CM | POA: Diagnosis not present

## 2022-03-01 DIAGNOSIS — E1159 Type 2 diabetes mellitus with other circulatory complications: Secondary | ICD-10-CM | POA: Diagnosis not present

## 2022-03-02 LAB — BMP8+EGFR
BUN/Creatinine Ratio: 16 (ref 9–20)
BUN: 16 mg/dL (ref 6–24)
CO2: 25 mmol/L (ref 20–29)
Calcium: 9.4 mg/dL (ref 8.7–10.2)
Chloride: 99 mmol/L (ref 96–106)
Creatinine, Ser: 0.99 mg/dL (ref 0.76–1.27)
Glucose: 133 mg/dL — ABNORMAL HIGH (ref 70–99)
Potassium: 4.3 mmol/L (ref 3.5–5.2)
Sodium: 140 mmol/L (ref 134–144)
eGFR: 88 mL/min/{1.73_m2} (ref >59)

## 2022-03-02 LAB — VITAMIN D 25 HYDROXY (VIT D DEFICIENCY, FRACTURES): Vit D, 25-Hydroxy: 34.8 ng/mL (ref 30.0–100.0)

## 2022-03-02 LAB — CBC
Hematocrit: 42.9 % (ref 37.5–51.0)
Hemoglobin: 14 g/dL (ref 13.0–17.7)
MCH: 27.7 pg (ref 26.6–33.0)
MCHC: 32.6 g/dL (ref 31.5–35.7)
MCV: 85 fL (ref 79–97)
Platelets: 364 10*3/uL (ref 150–450)
RBC: 5.05 x10E6/uL (ref 4.14–5.80)
RDW: 14.1 % (ref 11.6–15.4)
WBC: 13.9 10*3/uL — ABNORMAL HIGH (ref 3.4–10.8)

## 2022-03-02 LAB — HEMOGLOBIN A1C
Est. average glucose Bld gHb Est-mCnc: 137 mg/dL
Hgb A1c MFr Bld: 6.4 % — ABNORMAL HIGH (ref 4.8–5.6)

## 2022-03-02 LAB — TSH: TSH: 3.48 u[IU]/mL (ref 0.450–4.500)

## 2022-03-03 ENCOUNTER — Ambulatory Visit (INDEPENDENT_AMBULATORY_CARE_PROVIDER_SITE_OTHER): Payer: Medicare HMO | Admitting: Family Medicine

## 2022-03-03 ENCOUNTER — Encounter: Payer: Self-pay | Admitting: Family Medicine

## 2022-03-03 VITALS — BP 129/86 | HR 92 | Ht 72.0 in | Wt 255.0 lb

## 2022-03-03 DIAGNOSIS — F17210 Nicotine dependence, cigarettes, uncomplicated: Secondary | ICD-10-CM

## 2022-03-03 DIAGNOSIS — Z125 Encounter for screening for malignant neoplasm of prostate: Secondary | ICD-10-CM | POA: Diagnosis not present

## 2022-03-03 DIAGNOSIS — I10 Essential (primary) hypertension: Secondary | ICD-10-CM | POA: Diagnosis not present

## 2022-03-03 DIAGNOSIS — Z720 Tobacco use: Secondary | ICD-10-CM

## 2022-03-03 DIAGNOSIS — E785 Hyperlipidemia, unspecified: Secondary | ICD-10-CM | POA: Diagnosis not present

## 2022-03-03 DIAGNOSIS — E1159 Type 2 diabetes mellitus with other circulatory complications: Secondary | ICD-10-CM

## 2022-03-03 DIAGNOSIS — Z1211 Encounter for screening for malignant neoplasm of colon: Secondary | ICD-10-CM | POA: Diagnosis not present

## 2022-03-03 NOTE — Assessment & Plan Note (Signed)
  Patient re-educated about  the importance of commitment to a  minimum of 150 minutes of exercise per week as able.  The importance of healthy food choices with portion control discussed, as well as eating regularly and within a 12 hour window most days. The need to choose "clean , green" food 50 to 75% of the time is discussed, as well as to make water the primary drink and set a goal of 64 ounces water daily.       03/03/2022    8:11 AM 10/28/2021    8:08 AM 07/22/2021    2:46 PM  Weight /BMI  Weight 255 lb 0.6 oz 261 lb 276 lb  Height 6' (1.829 m) 6' (1.829 m) 6' (1.829 m)  BMI 34.59 kg/m2 35.4 kg/m2 37.43 kg/m2

## 2022-03-03 NOTE — Assessment & Plan Note (Signed)
Asked:confirms currently smokes cigarettes15/day Assess: Unwilling to set a quit date, but is cutting back Advise: needs to QUIT to reduce risk of cancer, cardio and cerebrovascular disease Assist: counseled for 5 minutes and literature provided Arrange: follow up in 2 to 4 months  

## 2022-03-03 NOTE — Assessment & Plan Note (Signed)
Controlled, no change in medication Dakota Mendez is reminded of the importance of commitment to daily physical activity for 30 minutes or more, as able and the need to limit carbohydrate intake to 30 to 60 grams per meal to help with blood sugar control.   The need to take medication as prescribed, test blood sugar as directed, and to call between visits if there is a concern that blood sugar is uncontrolled is also discussed.   Dakota Mendez is reminded of the importance of daily foot exam, annual eye examination, and good blood sugar, blood pressure and cholesterol control.     Latest Ref Rng & Units 03/01/2022    8:01 AM 10/27/2021    7:41 AM 09/19/2021    8:04 AM 07/21/2021    8:27 AM 01/31/2021    9:53 AM  Diabetic Labs  HbA1c 4.8 - 5.6 % 6.4  6.9   7.3  6.0   Chol 100 - 199 mg/dL  664      HDL >40 mg/dL  52      Calc LDL 0 - 99 mg/dL  86      Triglycerides 0 - 149 mg/dL  347      Creatinine 4.25 - 1.27 mg/dL 9.56  3.87  5.64  3.32  1.05       03/03/2022    8:11 AM 10/28/2021    8:08 AM 08/10/2021    3:03 PM 07/22/2021    3:04 PM 07/22/2021    2:46 PM 01/31/2021    9:39 AM 01/31/2021    8:45 AM  BP/Weight  Systolic BP 129 137 123 130 139 120 145  Diastolic BP 86 85 85 84 91 82 97  Wt. (Lbs) 255.04 261   276  277  BMI 34.59 kg/m2 35.4 kg/m2   37.43 kg/m2  37.57 kg/m2      07/22/2021    3:00 PM 04/29/2020    9:00 AM  Foot/eye exam completion dates  Foot Form Completion Done Done

## 2022-03-03 NOTE — Assessment & Plan Note (Signed)
Hyperlipidemia:Low fat diet discussed and encouraged.   Lipid Panel  Lab Results  Component Value Date   CHOL 159 10/27/2021   HDL 52 10/27/2021   LDLCALC 86 10/27/2021   TRIG 117 10/27/2021   CHOLHDL 3.1 10/27/2021

## 2022-03-06 LAB — MICROALBUMIN / CREATININE URINE RATIO
Creatinine, Urine: 191.9 mg/dL
Microalb/Creat Ratio: 28 mg/g creat (ref 0–29)
Microalbumin, Urine: 53.7 ug/mL

## 2022-03-21 DIAGNOSIS — Z1211 Encounter for screening for malignant neoplasm of colon: Secondary | ICD-10-CM | POA: Diagnosis not present

## 2022-03-27 ENCOUNTER — Other Ambulatory Visit: Payer: Self-pay | Admitting: Family Medicine

## 2022-03-29 LAB — COLOGUARD: COLOGUARD: NEGATIVE

## 2022-04-10 ENCOUNTER — Ambulatory Visit (INDEPENDENT_AMBULATORY_CARE_PROVIDER_SITE_OTHER): Payer: Medicare HMO

## 2022-04-10 DIAGNOSIS — Z0000 Encounter for general adult medical examination without abnormal findings: Secondary | ICD-10-CM | POA: Diagnosis not present

## 2022-04-10 NOTE — Progress Notes (Signed)
Subjective:   Dakota Mendez is a 61 y.o. male who presents for Medicare Annual/Subsequent preventive examination.  I connected with  Hollace Kinnier on 04/10/22 by a audio enabled telemedicine application and verified that I am speaking with the correct person using two identifiers.  Patient Location: Home  Provider Location: Office/Clinic  I discussed the limitations of evaluation and management by telemedicine. The patient expressed understanding and agreed to proceed.   Review of Systems     Dakota Mendez , Thank you for taking time to come for your Medicare Wellness Visit. I appreciate your ongoing commitment to your health goals. Please review the following plan we discussed and let me know if I can assist you in the future.   These are the goals we discussed:  Goals      Exercise 3x per week (30 min per time)     Recommend starting a routine exercise program at least 3 days a week for 30-45 minutes at a time as tolerated.       Medication Management     Patient Goals/Self-Care Activities Patient will:  Take medications as prescribed Check blood sugar once daily, document, and provide at future appointments Check blood pressure at least once daily, document, and provide at future appointments Collaborate with provider on medication access solutions Engage in dietary modifications by fewer sweetened foods & beverages Contact [facility] [place] and work with them to help you stop smokingd work with them to help you stop smoking       Quit Smoking     Would like to quit smoking      Weight (lb) < 200 lb (90.7 kg)     Patient would like to lose more weight this year         This is a list of the screening recommended for you and due dates:  Health Maintenance  Topic Date Due   Zoster (Shingles) Vaccine (1 of 2) Never done   COVID-19 Vaccine (5 - Moderna series) 03/25/2021   Eye exam for diabetics  04/05/2021   Tetanus Vaccine  08/23/2022*   Flu Shot  04/11/2022    Complete foot exam   07/23/2022   Hemoglobin A1C  08/31/2022   Colon Cancer Screening  08/09/2027   Hepatitis C Screening: USPSTF Recommendation to screen - Ages 18-79 yo.  Completed   HIV Screening  Completed   HPV Vaccine  Aged Out  *Topic was postponed. The date shown is not the original due date.          Objective:    There were no vitals filed for this visit. There is no height or weight on file to calculate BMI.     04/05/2021    1:01 PM 03/27/2018    4:41 PM 08/08/2017    8:43 AM 01/08/2017    8:43 AM 08/24/2015   10:16 AM 03/04/2015    2:11 PM 02/17/2015    3:47 PM  Advanced Directives  Does Patient Have a Medical Advance Directive? Yes Yes No No No No No  Type of Paramedic of [place];Living will       Does patient want to make changes to medical advance directive? No - Patient declined        Copy of [place] in Chart? No - copy requested No - copy requested       Would patient like information on creating a medical advance directive?   No -  Patient declined Yes (MAU/Ambulatory/Procedural Areas - Information given) No - patient declined information No - patient declined information No - patient declined information    Current Medications (verified) Outpatient Encounter Medications as of 04/10/2022  Medication Sig   Alcohol Swabs PADS 1 each by Does not apply route as directed.   amLODipine (NORVASC) 10 MG tablet TAKE 1 TABLET EVERY DAY   Blood Glucose Monitoring Suppl (TRUE METRIX METER) w/Device KIT USE AS DIRECTED   cloNIDine (CATAPRES) 0.3 MG tablet TAKE 1 TABLET EVERY DAY   clotrimazole-betamethasone (LOTRISONE) cream Apply twice daily, as needed, to affected aresa   ibuprofen (IBU) 800 MG tablet TAKE 1 TABLET TWICE DAILY AS NEEDED FOR PAIN   losartan-hydrochlorothiazide (HYZAAR) 100-25 MG tablet TAKE 1 TABLET EVERY DAY   metFORMIN (GLUCOPHAGE) 1000 MG tablet Take 1 tablet (1,000 mg total) by  mouth 2 (two) times daily with a meal.   potassium chloride (KLOR-CON M) 10 MEQ tablet TAKE 1 TABLET EVERY DAY   pravastatin (PRAVACHOL) 80 MG tablet TAKE 1 TABLET EVERY DAY   tadalafil (CIALIS) 20 MG tablet Take 0.5-1 tablets (10-20 mg total) by mouth every other day as needed for erectile dysfunction.   TRUE METRIX BLOOD GLUCOSE TEST test strip TEST BLOOD SUGAR  UP  TO FOUR TIMES DAILY AS DIRECTED   TRUEplus Lancets 33G MISC 1 each by Does not apply route as directed.   No facility-administered encounter medications on file as of 04/10/2022.    Allergies (verified) Jardiance [empagliflozin]   History: Past Medical History:  Diagnosis Date   Chronic back pain    Chronic neck pain    Diabetes mellitus    Hyperlipidemia    Hypertension    Obesity    Sleep apnea    states he does not wear his CPAP   Tobacco abuse    Past Surgical History:  Procedure Laterality Date   CERVICAL SPINE SURGERY     COLONOSCOPY N/A 08/08/2017   Procedure: COLONOSCOPY;  Surgeon: Rogene Houston, MD;  Location: [facility];  Service: Endoscopy;  Laterality: N/A;  [place]  09/21/2011   cervial spine   Family History  Problem Relation Age of Onset   Cirrhosis Father    Heart attack Mother    Depression Mother    Stroke Mother    Hearing loss Brother    Cirrhosis Brother    Diabetes Sister    Sleep apnea Son    Hypertension Son    Social History   Socioeconomic History   Marital status: Married    Spouse name: Not on file   Number of children: 4   Years of education: Not on file   Highest education level: 10th grade  Occupational History   Occupation: disabled-truck dirver  Tobacco Use   Smoking status: Every Day    Packs/day: 0.50    Years: 40.00    Total pack years: 20.00    Types: Cigarettes   Smokeless tobacco: Never   Tobacco comments:    1 PPD for 35 years  Vaping Use   Vaping Use: Never used  Substance and Sexual Activity   Alcohol use: No   Drug use: No    Sexual activity: Yes  Other Topics Concern   Not on file  Social History Narrative   Not on file   Social Determinants of Health   Financial Resource Strain: Low Risk  (04/05/2021)   Overall Financial Resource Strain (CARDIA)    Difficulty of Paying  Living Expenses: Not hard at all  Food Insecurity: No Food Insecurity (04/05/2021)   Hunger Vital Sign    Worried About Running Out of Food in the Last Year: Never true    Ran Out of Food in the Last Year: Never true  Transportation Needs: No Transportation Needs (04/05/2021)   PRAPARE - Hydrologist (Medical): No    Lack of Transportation (Non-Medical): No  Physical Activity: Sufficiently Active (04/05/2021)   Exercise Vital Sign    Days of Exercise per Week: 5 days    Minutes of Exercise per Session: 30 min  Stress: No Stress Concern Present (04/05/2021)   Coffee    Feeling of Stress : Only a little  Social Connections: Moderately Integrated (04/05/2021)   Social Connection and Isolation Panel [NHANES]    Frequency of Communication with Friends and Family: More than three times a week    Frequency of Social Gatherings with Friends and Family: More than three times a week    Attends Religious Services: More than 4 times per year    Active Member of [employer] or Organizations: No    Attends Archivist Meetings: Never    Marital Status: Married    Tobacco Counseling Ready to quit: Not Answered Counseling given: Not Answered Tobacco comments: 1 PPD for 35 years   Clinical Intake:                 Diabetic? Yes Nutrition Risk Assessment:  Has the patient had any N/V/D within the last 2 months?  No  Does the patient have any non-healing wounds?  No  Has the patient had any unintentional weight loss or weight gain?  No   Diabetes:  Is the patient diabetic?  Yes  If diabetic, was a CBG obtained today?  No  Did the  patient bring in their glucometer from home?  No  How often do you monitor your CBG's? 3 times a week.   Financial Strains and Diabetes Management:  Are you having any financial strains with the device, your supplies or your medication? No .  Does the patient want to be seen by Chronic Care Management for management of their diabetes?  No  Would the patient like to be referred to a Nutritionist or for Diabetic Management?  No   Diabetic Exams:  Diabetic Eye Exam: Completed.   Diabetic Foot Exam: Completed 07/23/2021.            Activities of Daily Living     No data to display           Patient Care Team: Fayrene Helper, MD as PCP - General Waldo Laine, Gwenyth Allegra, [facility] (Inactive) (Pharmacist)  Indicate any recent Medical Services you may have received from other than [facility] providers in the past year (date may be approximate).     Assessment:   This is a routine wellness examination for Dakota Mendez.  Hearing/Vision screen No results found.  Dietary issues and exercise activities discussed:     Goals Addressed   None   Depression Screen    03/03/2022    8:11 AM 10/28/2021    8:11 AM 10/05/2021    9:33 AM 04/05/2021    1:02 PM 04/05/2021   12:58 PM 01/31/2021    8:47 AM 10/18/2020    8:31 AM  PHQ 2/9 Scores  PHQ - 2 Score 0 0 0 0 0 0 0    Fall  Risk    03/03/2022    8:11 AM 10/28/2021    8:11 AM 10/05/2021    9:33 AM 07/22/2021    2:48 PM 04/05/2021    1:02 PM  Fall Risk   Falls in the past year? 0 0 0 1 0  Number falls in past yr: 0 0 0 0 0  Injury with Fall? 0 0 0 0 0  Risk for fall due to : No Fall Risks No Fall Risks No Fall Risks  No Fall Risks  Follow up Falls evaluation completed Falls evaluation completed Falls evaluation completed  Falls evaluation completed    [place]:  Any stairs in or around the home? Yes  If so, are there any without handrails? No  Home free of loose throw rugs in walkways, pet beds,  electrical cords, etc? Yes  Adequate lighting in your home to reduce risk of falls? Yes   ASSISTIVE DEVICES UTILIZED TO PREVENT FALLS:  Life alert? No  Use of a cane, walker or w/c? No  Grab bars in the bathroom? No  Shower chair or bench in shower? No  Elevated toilet seat or a handicapped toilet? No    Cognitive Function:    04/05/2021    1:03 PM  MMSE - Mini Mental State Exam  Not completed: Unable to complete        04/05/2021    1:03 PM 04/01/2020    1:17 PM 04/01/2019    9:53 AM 03/27/2018    4:44 PM 01/08/2017    8:47 AM  6CIT Screen  What Year? 0 points 0 points 0 points 0 points 0 points  What month? 0 points 0 points 0 points 0 points 0 points  What time? 0 points 0 points 0 points 0 points 0 points  Count back from 20 0 points 0 points 0 points 0 points 0 points  Months in reverse 0 points 0 points 0 points 0 points 0 points  Repeat phrase 0 points 0 points 0 points 0 points 0 points  Total Score 0 points 0 points 0 points 0 points 0 points    Immunizations Immunization History  Administered Date(s) Administered   Influenza Whole 10/11/2007, 05/17/2011   Influenza,inj,Quad PF,6+ Mos 06/13/2013, 06/18/2014, 07/22/2015, 06/01/2016, 07/25/2017, 05/30/2018, 06/09/2019, 04/29/2020, 06/13/2021   Moderna Sars-Covid-2 Vaccination 12/05/2019, 01/06/2020, 07/26/2020, 01/28/2021   Pneumococcal Conjugate-13 12/15/2014   Pneumococcal Polysaccharide-23 04/05/2011, 06/01/2016   Td 09/21/2006    TDAP status: Due, Education has been provided regarding the importance of this vaccine. Advised may receive this vaccine at local pharmacy or Health Dept. Aware to provide a copy of the vaccination record if obtained from local pharmacy or Health Dept. Verbalized acceptance and understanding.  Flu Vaccine status: Up to date  Covid-19 vaccine status: Completed vaccines  Qualifies for Shingles Vaccine? Yes   Zostavax completed No   Shingrix Completed?: No.    Education has been  provided regarding the importance of this vaccine. Patient has been advised to call insurance company to determine out of pocket expense if they have not yet received this vaccine. Advised may also receive vaccine at local pharmacy or Health Dept. Verbalized acceptance and understanding.  Screening Tests Health Maintenance  Topic Date Due   Zoster Vaccines- Shingrix (1 of 2) Never done   COVID-19 Vaccine (5 - Moderna series) 03/25/2021   OPHTHALMOLOGY EXAM  04/05/2021   TETANUS/TDAP  08/23/2022 (Originally 09/21/2016)   INFLUENZA VACCINE  04/11/2022   FOOT EXAM  07/23/2022   HEMOGLOBIN A1C  08/31/2022   COLONOSCOPY (Pts 45-49yrs Insurance coverage will need to be confirmed)  08/09/2027   Hepatitis C Screening  Completed   HIV Screening  Completed   HPV VACCINES  Aged Out    Health Maintenance  Health Maintenance Due  Topic Date Due   Zoster Vaccines- Shingrix (1 of 2) Never done   COVID-19 Vaccine (5 - Moderna series) 03/25/2021   OPHTHALMOLOGY EXAM  04/05/2021    Colorectal cancer screening: Type of screening: Colonoscopy. Completed 08/08/2017. Repeat every 10 years  Lung Cancer Screening: (Low Dose CT Chest recommended if Age 66-80 years, 30 pack-year currently smoking OR have quit w/in 15years.) does qualify.   Additional Screening:  Hepatitis C Screening: does qualify; Completed 11/27/2015  Vision Screening: Recommended annual ophthalmology exams for early detection of glaucoma and other disorders of the eye. Is the patient up to date with their annual eye exam?  Yes  Who is the provider or what is the name of the office in which the patient attends annual eye exams? [facility]  Dental Screening: Recommended annual dental exams for proper oral hygiene  Community Resource Referral / Chronic Care Management: CRR required this visit?  No   CCM required this visit?  No      Plan:     I have personally reviewed and noted the following in the patient's chart:    Medical and social history Use of alcohol, tobacco or illicit drugs  Current medications and supplements including opioid prescriptions. Patient is not currently taking opioid prescriptions. Functional ability and status Nutritional status Physical activity Advanced directives List of other physicians Hospitalizations, surgeries, and ER visits in previous 12 months Vitals Screenings to include cognitive, depression, and falls Referrals and appointments  In addition, I have reviewed and discussed with patient certain preventive protocols, quality metrics, and best practice recommendations. A written personalized care plan for preventive services as well as general preventive health recommendations were provided to patient.     Johny Drilling, [place]   04/10/2022   Nurse Notes:  Mr. Jeanbaptiste , Thank you for taking time to come for your Medicare Wellness Visit. I appreciate your ongoing commitment to your health goals. Please review the following plan we discussed and let me know if I can assist you in the future.   These are the goals we discussed:  Goals      Exercise 3x per week (30 min per time)     Recommend starting a routine exercise program at least 3 days a week for 30-45 minutes at a time as tolerated.       Medication Management     Patient Goals/Self-Care Activities Patient will:  Take medications as prescribed Check blood sugar once daily, document, and provide at future appointments Check blood pressure at least once daily, document, and provide at future appointments Collaborate with provider on medication access solutions Engage in dietary modifications by fewer sweetened foods & beverages Contact [facility] [place] and work with them to help you stop smokingd work with them to help you stop smoking       Quit Smoking     Would like to quit smoking      Weight (lb) < 200 lb (90.7 kg)     Patient would like to lose more weight this year         This is a list  of the screening recommended for you and due dates:  Health Maintenance  Topic Date Due  Zoster (Shingles) Vaccine (1 of 2) Never done   COVID-19 Vaccine (5 - Moderna series) 03/25/2021   Eye exam for diabetics  04/05/2021   Tetanus Vaccine  08/23/2022*   Flu Shot  04/11/2022   Complete foot exam   07/23/2022   Hemoglobin A1C  08/31/2022   Colon Cancer Screening  08/09/2027   Hepatitis C Screening: USPSTF Recommendation to screen - Ages 18-79 yo.  Completed   HIV Screening  Completed   HPV Vaccine  Aged Out  *Topic was postponed. The date shown is not the original due date.

## 2022-04-10 NOTE — Patient Instructions (Signed)
[name] , Thank you for taking time to come for your Medicare Wellness Visit. I appreciate your ongoing commitment to your health goals. Please review the following plan we discussed and let me know if I can assist you in the future.   Screening recommendations/referrals: Colonoscopy: Completed Recommended yearly ophthalmology/optometry visit for glaucoma screening and checkup Recommended yearly dental visit for hygiene and checkup  Vaccinations: Influenza vaccine: Completed Tdap vaccine: Get at your next visit Shingles vaccine: Get at your local pharmacy  Advanced directives: patient declined  Conditions/risks identified: falls, hypertension, diabetes, smoking  Next appointment: 1 year  Preventive Care 40-64 Years, Male Preventive care refers to lifestyle choices and visits with your health care provider that can promote health and wellness. What does preventive care include? A yearly physical exam. This is also called an annual well check. Dental exams once or twice a year. Routine eye exams. Ask your health care provider how often you should have your eyes checked. Personal lifestyle choices, including: Daily care of your teeth and gums. Regular physical activity. Eating a healthy diet. Avoiding tobacco and drug use. Limiting alcohol use. Practicing safe sex. Taking low-dose aspirin every day starting at age 15. What happens during an annual well check? The services and screenings done by your health care provider during your annual well check will depend on your age, overall health, lifestyle risk factors, and family history of disease. Counseling  Your health care provider may ask you questions about your: Alcohol use. Tobacco use. Drug use. Emotional well-being. Home and relationship well-being. Sexual activity. Eating habits. Work and work Statistician. Screening  You may have the following tests or measurements: Height, weight, and BMI. Blood  pressure. Lipid and cholesterol levels. These may be checked every 5 years, or more frequently if you are over 40 years old. Skin check. Lung cancer screening. You may have this screening every year starting at age 63 if you have a 30-pack-year history of smoking and currently smoke or have quit within the past 15 years. Fecal occult blood test (FOBT) of the stool. You may have this test every year starting at age 63. Flexible sigmoidoscopy or colonoscopy. You may have a sigmoidoscopy every 5 years or a colonoscopy every 10 years starting at age 42. Prostate cancer screening. Recommendations will vary depending on your family history and other risks. Hepatitis C blood test. Hepatitis B blood test. Sexually transmitted disease (STD) testing. Diabetes screening. This is done by checking your blood sugar (glucose) after you have not eaten for a while (fasting). You may have this done every 1-3 years. Discuss your test results, treatment options, and if necessary, the need for more tests with your health care provider. Vaccines  Your health care provider may recommend certain vaccines, such as: Influenza vaccine. This is recommended every year. Tetanus, diphtheria, and acellular pertussis (Tdap, Td) vaccine. You may need a Td booster every 10 years. Zoster vaccine. You may need this after age 11. Pneumococcal 13-valent conjugate (PCV13) vaccine. You may need this if you have certain conditions and have not been vaccinated. Pneumococcal polysaccharide (PPSV23) vaccine. You may need one or two doses if you smoke cigarettes or if you have certain conditions. Talk to your health care provider about which screenings and vaccines you need and how often you need them. This information is not intended to replace advice given to you by your health care provider. Make sure you discuss any questions you have with your health care provider. Document Released: 09/24/2015 Document Revised:  05/17/2016 Document  Reviewed: 06/29/2015 Elsevier Interactive Patient Education  2017 [place] Prevention in the Home Falls can cause injuries. They can happen to people of all ages. There are many things you can do to make your home safe and to help prevent falls. What can I do on the outside of my home? Regularly fix the edges of walkways and driveways and fix any cracks. Remove anything that might make you trip as you walk through a door, such as a raised step or threshold. Trim any bushes or trees on the path to your home. Use bright outdoor lighting. Clear any walking paths of anything that might make someone trip, such as rocks or tools. Regularly check to see if handrails are loose or broken. Make sure that both sides of any steps have handrails. Any raised decks and porches should have guardrails on the edges. Have any leaves, snow, or ice cleared regularly. Use sand or salt on walking paths during winter. Clean up any spills in your garage right away. This includes oil or grease spills. What can I do in the bathroom? Use night lights. Install grab bars by the toilet and in the tub and shower. Do not use towel bars as grab bars. Use non-skid mats or decals in the tub or shower. If you need to sit down in the shower, use a plastic, non-slip stool. Keep the floor dry. Clean up any water that spills on the floor as soon as it happens. Remove soap buildup in the tub or shower regularly. Attach bath mats securely with double-sided non-slip rug tape. Do not have throw rugs and other things on the floor that can make you trip. What can I do in the bedroom? Use night lights. Make sure that you have a light by your bed that is easy to reach. Do not use any sheets or blankets that are too big for your bed. They should not hang down onto the floor. Have a firm chair that has side arms. You can use this for support while you get dressed. Do not have throw rugs and other things on the floor that can  make you trip. What can I do in the kitchen? Clean up any spills right away. Avoid walking on wet floors. Keep items that you use a lot in easy-to-reach places. If you need to reach something above you, use a strong step stool that has a grab bar. Keep electrical cords out of the way. Do not use floor polish or wax that makes floors slippery. If you must use wax, use non-skid floor wax. Do not have throw rugs and other things on the floor that can make you trip. What can I do with my stairs? Do not leave any items on the stairs. Make sure that there are handrails on both sides of the stairs and use them. Fix handrails that are broken or loose. Make sure that handrails are as long as the stairways. Check any carpeting to make sure that it is firmly attached to the stairs. Fix any carpet that is loose or worn. Avoid having throw rugs at the top or bottom of the stairs. If you do have throw rugs, attach them to the floor with carpet tape. Make sure that you have a light switch at the top of the stairs and the bottom of the stairs. If you do not have them, ask someone to add them for you. What else can I do to help prevent falls? Wear shoes  that: Do not have high heels. Have rubber bottoms. Are comfortable and fit you well. Are closed at the toe. Do not wear sandals. If you use a stepladder: Make sure that it is fully opened. Do not climb a closed stepladder. Make sure that both sides of the stepladder are locked into place. Ask someone to hold it for you, if possible. Clearly mark and make sure that you can see: Any grab bars or handrails. First and last steps. Where the edge of each step is. Use tools that help you move around (mobility aids) if they are needed. These include: Canes. Walkers. Scooters. Crutches. Turn on the lights when you go into a dark area. Replace any light bulbs as soon as they burn out. Set up your furniture so you have a clear path. Avoid moving your furniture  around. If any of your floors are uneven, fix them. If there are any pets around you, be aware of where they are. Review your medicines with your doctor. Some medicines can make you feel dizzy. This can increase your chance of falling. Ask your doctor what other things that you can do to help prevent falls. This information is not intended to replace advice given to you by your health care provider. Make sure you discuss any questions you have with your health care provider. Document Released: 06/24/2009 Document Revised: 02/03/2016 Document Reviewed: 10/02/2014 Elsevier Interactive Patient Education  2017 [employer].

## 2022-04-26 ENCOUNTER — Other Ambulatory Visit: Payer: Self-pay | Admitting: Family Medicine

## 2022-06-12 ENCOUNTER — Other Ambulatory Visit: Payer: Self-pay | Admitting: Family Medicine

## 2022-06-13 ENCOUNTER — Other Ambulatory Visit: Payer: Self-pay

## 2022-06-13 DIAGNOSIS — Z122 Encounter for screening for malignant neoplasm of respiratory organs: Secondary | ICD-10-CM

## 2022-06-13 DIAGNOSIS — Z87891 Personal history of nicotine dependence: Secondary | ICD-10-CM

## 2022-06-13 NOTE — Progress Notes (Signed)
Order placed for LDCT per protocol. LDCT schedule for 07/27/2022 0800. Patient aware.

## 2022-07-05 ENCOUNTER — Ambulatory Visit (INDEPENDENT_AMBULATORY_CARE_PROVIDER_SITE_OTHER): Payer: Medicare HMO

## 2022-07-05 DIAGNOSIS — Z23 Encounter for immunization: Secondary | ICD-10-CM

## 2022-07-12 ENCOUNTER — Other Ambulatory Visit: Payer: Self-pay | Admitting: Family Medicine

## 2022-07-12 DIAGNOSIS — E1159 Type 2 diabetes mellitus with other circulatory complications: Secondary | ICD-10-CM

## 2022-07-12 DIAGNOSIS — I10 Essential (primary) hypertension: Secondary | ICD-10-CM

## 2022-07-25 DIAGNOSIS — E1159 Type 2 diabetes mellitus with other circulatory complications: Secondary | ICD-10-CM | POA: Diagnosis not present

## 2022-07-25 DIAGNOSIS — I10 Essential (primary) hypertension: Secondary | ICD-10-CM | POA: Diagnosis not present

## 2022-07-25 DIAGNOSIS — E785 Hyperlipidemia, unspecified: Secondary | ICD-10-CM | POA: Diagnosis not present

## 2022-07-25 DIAGNOSIS — Z125 Encounter for screening for malignant neoplasm of prostate: Secondary | ICD-10-CM | POA: Diagnosis not present

## 2022-07-26 ENCOUNTER — Ambulatory Visit (INDEPENDENT_AMBULATORY_CARE_PROVIDER_SITE_OTHER): Payer: Medicare HMO | Admitting: Family Medicine

## 2022-07-26 ENCOUNTER — Encounter: Payer: Self-pay | Admitting: Family Medicine

## 2022-07-26 VITALS — BP 128/80 | HR 96 | Ht 72.0 in | Wt 251.1 lb

## 2022-07-26 DIAGNOSIS — D72829 Elevated white blood cell count, unspecified: Secondary | ICD-10-CM

## 2022-07-26 DIAGNOSIS — G4733 Obstructive sleep apnea (adult) (pediatric): Secondary | ICD-10-CM

## 2022-07-26 DIAGNOSIS — Z0001 Encounter for general adult medical examination with abnormal findings: Secondary | ICD-10-CM

## 2022-07-26 DIAGNOSIS — E669 Obesity, unspecified: Secondary | ICD-10-CM | POA: Diagnosis not present

## 2022-07-26 DIAGNOSIS — B351 Tinea unguium: Secondary | ICD-10-CM

## 2022-07-26 DIAGNOSIS — Z720 Tobacco use: Secondary | ICD-10-CM | POA: Diagnosis not present

## 2022-07-26 DIAGNOSIS — E1159 Type 2 diabetes mellitus with other circulatory complications: Secondary | ICD-10-CM | POA: Diagnosis not present

## 2022-07-26 MED ORDER — TERBINAFINE HCL 250 MG PO TABS: 250.0000 mg | ORAL_TABLET | Freq: Every day | ORAL | 1 refills | Status: DC

## 2022-07-26 NOTE — Patient Instructions (Addendum)
F/U  2nd week in April, call if you need me before  Shingrix #2 now due, pls get at your pharmacy  Non fasting HBA1C , chem 7 and EGFr 5 days before April visit  12 weeks medication , terbinafine prescribed for fungal toenail infection  Nurse please schedule diabetic retinal screen at checkout  Accept the quit smoking challenge for Winter, call 1800QUITNOW for help  I encourage you to join the [facility] and make weight lifting  and swimming a part of your routine  NORMAL kidney, lver and prostate blood tests  You are referred to Hematology/Oncolgy for evaluation of eplevated WBC  Thanks for choosing [facility], we consider it a privelige to serve you.  BEST for 2024!

## 2022-07-26 NOTE — Assessment & Plan Note (Signed)

## 2022-07-26 NOTE — Progress Notes (Signed)
   Dakota Mendez     MRN: [id number]      DOB: 20-Sep-1960   HPI: Patient is in for annual physical exam. Nicotine dependence and fungal nail infection are addressed also Recent labs, if available are reviewed. Immunization is reviewed , and  updated if needed.    PE; BP 128/80   Pulse 96   Ht 6' (1.829 m)   Wt 251 lb 1.9 oz (113.9 kg)   SpO2 98%   BMI 34.06 kg/m   Pleasant male, alert and oriented x 3, in no cardio-pulmonary distress. Afebrile. HEENT No facial trauma or asymetry. Sinuses non tender. EOMI External ears normal,  Neck: supple, no adenopathy,JVD or thyromegaly.No bruits.  Chest: Clear to ascultation bilaterally.No crackles or wheezes. Non tender to palpation  Cardiovascular system; Heart sounds normal,  S1 and  S2 ,no S3.  No murmur, or thrill. Apical beat not displaced Peripheral pulses normal.  Abdomen: Soft, non tender, no organomegaly or masses. No bruits. Bowel sounds normal. No guarding, tenderness or rebound.    Musculoskeletal exam: Full ROM of spine, hips , shoulders and knees. No deformity ,swelling or crepitus noted. No muscle wasting or atrophy.   Neurologic: Cranial nerves 2 to 12 intact. Power, tone ,sensation and reflexes normal throughout. No disturbance in gait. No tremor.  Skin: Intact, no ulceration, erythema , scaling or rash noted.Bilateral onychomycosis, severe Pigmentation normal throughout  Psych; Normal mood and affect. Judgement and concentration normal   Assessment & Plan:  Encounter for Medicare annual examination with abnormal findings Annual exam as documented. Counseling done  re healthy lifestyle involving commitment to 150 minutes exercise per week, heart healthy diet, and attaining healthy weight.The importance of adequate sleep also discussed. Regular seat belt use and home safety, is also discussed. Changes in health habits are decided on by the patient with goals and time frames  set for achieving  them. Immunization and cancer screening needs are specifically addressed at this visit.   Tobacco abuse Asked:confirms currently smokes cigarettes approx 7 to 10/day Assess: Unwilling to set a quit date, but is cutting back Advise: needs to QUIT to reduce risk of cancer, cardio and cerebrovascular disease Assist: counseled for 5 minutes and literature provided Arrange: follow up in 2 to 4 months   Obstructive sleep apnea Reports improved symptoms, never got CPAP, has lost a significant amt of weight and feels better   Onychomycosis Terbinafine x 12 weeks  Obesity (BMI 30.0-34.9) Imporoving continually with lifestyle change which is great  Patient re-educated about  the importance of commitment to a  minimum of 150 minutes of exercise per week as able.  The importance of healthy food choices with portion control discussed, as well as eating regularly and within a 12 hour window most days. The need to choose "clean , green" food 50 to 75% of the time is discussed, as well as to make water the primary drink and set a goal of 64 ounces water daily.       07/26/2022    8:05 AM 03/03/2022    8:11 AM 10/28/2021    8:08 AM  Weight /BMI  Weight 251 lb 1.9 oz 255 lb 0.6 oz 261 lb  Height 6' (1.829 m) 6' (1.829 m) 6' (1.829 m)  BMI 34.06 kg/m2 34.59 kg/m2 35.4 kg/m2

## 2022-07-27 ENCOUNTER — Ambulatory Visit (HOSPITAL_COMMUNITY)
Admission: RE | Admit: 2022-07-27 | Discharge: 2022-07-27 | Disposition: A | Discharge status: Home / Self Care | Payer: Medicare HMO | Source: Ambulatory Visit | Attending: Physician Assistant | Admitting: Physician Assistant

## 2022-07-27 DIAGNOSIS — F17210 Nicotine dependence, cigarettes, uncomplicated: Secondary | ICD-10-CM | POA: Diagnosis not present

## 2022-07-27 DIAGNOSIS — Z87891 Personal history of nicotine dependence: Secondary | ICD-10-CM

## 2022-07-27 DIAGNOSIS — Z122 Encounter for screening for malignant neoplasm of respiratory organs: Secondary | ICD-10-CM

## 2022-07-27 LAB — CMP14+EGFR
ALT: 11 IU/L (ref 0–44)
AST: 13 IU/L (ref 0–40)
Albumin/Globulin Ratio: 1.7 (ref 1.2–2.2)
Albumin: 4.3 g/dL (ref 3.8–4.9)
Alkaline Phosphatase: 79 IU/L (ref 44–121)
BUN/Creatinine Ratio: 13 (ref 9–20)
BUN: 13 mg/dL (ref 6–24)
Bilirubin Total: 0.9 mg/dL (ref 0.0–1.2)
CO2: 23 mmol/L (ref 20–29)
Calcium: 9.7 mg/dL (ref 8.7–10.2)
Chloride: 99 mmol/L (ref 96–106)
Creatinine, Ser: 0.99 mg/dL (ref 0.76–1.27)
Globulin, Total: 2.6 g/dL (ref 1.5–4.5)
Glucose: 128 mg/dL — ABNORMAL HIGH (ref 70–99)
Potassium: 4 mmol/L (ref 3.5–5.2)
Sodium: 140 mmol/L (ref 134–144)
Total Protein: 6.9 g/dL (ref 6.0–8.5)
eGFR: 88 mL/min/{1.73_m2} (ref >59)

## 2022-07-27 LAB — LIPID PANEL
Chol/HDL Ratio: 3.3 ratio (ref 0.0–5.0)
Cholesterol, Total: 163 mg/dL (ref 100–199)
HDL: 49 mg/dL (ref >39)
LDL Chol Calc (NIH): 91 mg/dL (ref 0–99)
Triglycerides: 128 mg/dL (ref 0–149)
VLDL Cholesterol Cal: 23 mg/dL (ref 5–40)

## 2022-07-27 LAB — HEMOGLOBIN A1C
Est. average glucose Bld gHb Est-mCnc: 137 mg/dL
Hgb A1c MFr Bld: 6.4 % — ABNORMAL HIGH (ref 4.8–5.6)

## 2022-07-27 LAB — PSA: Prostate Specific Ag, Serum: 1.2 ng/mL (ref 0.0–4.0)

## 2022-07-28 ENCOUNTER — Encounter: Payer: Self-pay | Admitting: Family Medicine

## 2022-07-28 DIAGNOSIS — D72829 Elevated white blood cell count, unspecified: Secondary | ICD-10-CM | POA: Insufficient documentation

## 2022-07-28 NOTE — Assessment & Plan Note (Signed)
Refer hematology for evaluation

## 2022-07-28 NOTE — Assessment & Plan Note (Signed)
Imporoving continually with lifestyle change which is great  Patient re-educated about  the importance of commitment to a  minimum of 150 minutes of exercise per week as able.  The importance of healthy food choices with portion control discussed, as well as eating regularly and within a 12 hour window most days. The need to choose "clean , green" food 50 to 75% of the time is discussed, as well as to make water the primary drink and set a goal of 64 ounces water daily.       07/26/2022    8:05 AM 03/03/2022    8:11 AM 10/28/2021    8:08 AM  Weight /BMI  Weight 251 lb 1.9 oz 255 lb 0.6 oz 261 lb  Height 6' (1.829 m) 6' (1.829 m) 6' (1.829 m)  BMI 34.06 kg/m2 34.59 kg/m2 35.4 kg/m2

## 2022-07-28 NOTE — Assessment & Plan Note (Signed)
Asked:confirms currently smokes cigarettes approx 7 to 10/day Assess: Unwilling to set a quit date, but is cutting back Advise: needs to QUIT to reduce risk of cancer, cardio and cerebrovascular disease Assist: counseled for 5 minutes and literature provided Arrange: follow up in 2 to 4 months

## 2022-07-28 NOTE — Assessment & Plan Note (Signed)
Terbinafine x 12 weeks 

## 2022-07-28 NOTE — Assessment & Plan Note (Signed)
Reports improved symptoms, never got CPAP, has lost a significant amt of weight and feels better

## 2022-08-08 ENCOUNTER — Ambulatory Visit: Payer: Medicare HMO

## 2022-08-08 LAB — HM DIABETES EYE EXAM

## 2022-08-23 ENCOUNTER — Telehealth: Payer: Self-pay | Admitting: Family Medicine

## 2022-08-23 NOTE — Telephone Encounter (Signed)
Patient needs to know if his blood work is critical to have done please contact patient back at [phone].

## 2022-08-23 NOTE — Telephone Encounter (Signed)
I spoke with pt, does not need labs  before April, does need to keep hematology appt

## 2022-08-25 ENCOUNTER — Inpatient Hospital Stay: Payer: Medicare HMO | Attending: Hematology | Admitting: Hematology

## 2022-08-25 ENCOUNTER — Inpatient Hospital Stay: Payer: Medicare HMO

## 2022-08-25 VITALS — BP 148/89 | HR 111 | Temp 98.2°F | Resp 18 | Ht 72.0 in | Wt 250.5 lb

## 2022-08-25 DIAGNOSIS — E119 Type 2 diabetes mellitus without complications: Secondary | ICD-10-CM | POA: Diagnosis not present

## 2022-08-25 DIAGNOSIS — F17210 Nicotine dependence, cigarettes, uncomplicated: Secondary | ICD-10-CM | POA: Diagnosis not present

## 2022-08-25 DIAGNOSIS — D72828 Other elevated white blood cell count: Secondary | ICD-10-CM | POA: Insufficient documentation

## 2022-08-25 DIAGNOSIS — I10 Essential (primary) hypertension: Secondary | ICD-10-CM | POA: Insufficient documentation

## 2022-08-25 DIAGNOSIS — D72829 Elevated white blood cell count, unspecified: Secondary | ICD-10-CM

## 2022-08-25 LAB — CBC WITH DIFFERENTIAL/PLATELET
Abs Immature Granulocytes: 0.03 10*3/uL (ref 0.00–0.07)
Basophils Absolute: 0.1 10*3/uL (ref 0.0–0.1)
Basophils Relative: 1 %
Eosinophils Absolute: 0.1 10*3/uL (ref 0.0–0.5)
Eosinophils Relative: 1 %
HCT: 45 % (ref 39.0–52.0)
Hemoglobin: 14.7 g/dL (ref 13.0–17.0)
Immature Granulocytes: 0 %
Lymphocytes Relative: 33 %
Lymphs Abs: 3.6 10*3/uL (ref 0.7–4.0)
MCH: 28.5 pg (ref 26.0–34.0)
MCHC: 32.7 g/dL (ref 30.0–36.0)
MCV: 87.4 fL (ref 80.0–100.0)
Monocytes Absolute: 0.6 10*3/uL (ref 0.1–1.0)
Monocytes Relative: 5 %
Neutro Abs: 6.6 10*3/uL (ref 1.7–7.7)
Neutrophils Relative %: 60 %
Platelets: 391 10*3/uL (ref 150–400)
RBC: 5.15 MIL/uL (ref 4.22–5.81)
RDW: 14.1 % (ref 11.5–15.5)
WBC: 11 10*3/uL — ABNORMAL HIGH (ref 4.0–10.5)
nRBC: 0 % (ref 0.0–0.2)

## 2022-08-25 LAB — C-REACTIVE PROTEIN: CRP: 0.6 mg/dL (ref <1.0)

## 2022-08-25 LAB — SEDIMENTATION RATE: Sed Rate: 19 mm/hr — ABNORMAL HIGH (ref 0–16)

## 2022-08-25 LAB — LACTATE DEHYDROGENASE: LDH: 114 U/L (ref 98–192)

## 2022-08-25 NOTE — Progress Notes (Signed)
 CONSULT NOTE  Patient Care Team: Simpson, Margaret E, MD as PCP - General Walston, Christopher J, RPH (Inactive) (Pharmacist)  CHIEF COMPLAINTS/PURPOSE OF CONSULTATION:  Leukocytosis  HISTORY OF PRESENTING ILLNESS:  Dakota Mendez 61 y.o. male is seen in consultation today at the request of Dr. Simpson for further workup and management of leukocytosis.  CBC on 03/01/2022 showed white count 13.9.  Hemoglobin and platelet count was normal.  Review of EMR shows white count mildly elevated since 2009.  He reports he that he lost about 15 pounds in the last 6 months but made changes to his diet and started exercising with walking.  Denies any fevers or night sweats.  No recurrent infections.  No prior history of splenectomy.  The patient lives at home with his wife and is independent of ADLs and IADLs.  He is a retired truck driver.  Smokes about 1 pack/day for 40 years.  No family history of leukemia or malignancies.  MEDICAL HISTORY:  Past Medical History:  Diagnosis Date   Chronic back pain    Chronic neck pain    Diabetes mellitus    Hyperlipidemia    Hypertension    Obesity    Sleep apnea    states he does not wear his CPAP   Tobacco abuse     SURGICAL HISTORY: Past Surgical History:  Procedure Laterality Date   CERVICAL SPINE SURGERY     COLONOSCOPY N/A 08/08/2017   Procedure: COLONOSCOPY;  Surgeon: Rehman, Najeeb U, MD;  Location: [facility];  Service: Endoscopy;  Laterality: N/A;  930   SPINE SURGERY  09/21/2011   cervial spine    SOCIAL HISTORY: Social History   Socioeconomic History   Marital status: Married    Spouse name: Not on file   Number of children: 4   Years of education: Not on file   Highest education level: 10th grade  Occupational History   Occupation: disabled-truck dirver  Tobacco Use   Smoking status: Every Day    Packs/day: 0.50    Years: 40.00    Total pack years: 20.00    Types: Cigarettes   Smokeless tobacco: Never   Tobacco  comments:    1 PPD for 35 years  Vaping Use   Vaping Use: Never used  Substance and Sexual Activity   Alcohol use: No   Drug use: No   Sexual activity: Yes  Other Topics Concern   Not on file  Social History Narrative   Not on file   Social Determinants of Health   Financial Resource Strain: Low Risk  (04/10/2022)   Overall Financial Resource Strain (CARDIA)    Difficulty of Paying Living Expenses: Not hard at all  Food Insecurity: No Food Insecurity (08/25/2022)   Hunger Vital Sign    Worried About Running Out of Food in the Last Year: Never true    Ran Out of Food in the Last Year: Never true  Transportation Needs: No Transportation Needs (04/10/2022)   PRAPARE - Transportation    Lack of Transportation (Medical): No    Lack of Transportation (Non-Medical): No  Physical Activity: Sufficiently Active (04/10/2022)   Exercise Vital Sign    Days of Exercise per Week: 5 days    Minutes of Exercise per Session: 30 min  Stress: No Stress Concern Present (04/10/2022)   [employer] - Occupational Stress Questionnaire    Feeling of Stress : Not at all  Social Connections: Moderately Integrated (04/10/2022)   Social   Connection and Isolation Panel [NHANES]    Frequency of Communication with Friends and Family: More than three times a week    Frequency of Social Gatherings with Friends and Family: Three times a week    Attends Religious Services: More than 4 times per year    Active Member of Clubs or Organizations: No    Attends Archivist Meetings: Never    Marital Status: Married  Human resources officer Violence: Not At Risk (08/25/2022)   Humiliation, Afraid, Rape, and Kick questionnaire    Fear of Current or Ex-Partner: No    Emotionally Abused: No    Physically Abused: No    Sexually Abused: No    FAMILY HISTORY: Family History  Problem Relation Age of Onset   Cirrhosis Father    Heart attack Mother    Depression Mother    Stroke Mother     Hearing loss Brother    Cirrhosis Brother    Diabetes Sister    Sleep apnea Son    Hypertension Son     ALLERGIES:  is allergic to jardiance [empagliflozin].  MEDICATIONS:  Current Outpatient Medications  Medication Sig Dispense Refill   Alcohol Swabs PADS 1 each by Does not apply route as directed. 100 each 0   amLODipine (NORVASC) 10 MG tablet TAKE 1 TABLET EVERY DAY 90 tablet 1   Blood Glucose Monitoring Suppl (TRUE METRIX METER) w/Device KIT USE AS DIRECTED 1 kit 0   cloNIDine (CATAPRES) 0.3 MG tablet TAKE 1 TABLET EVERY DAY 90 tablet 10   clotrimazole-betamethasone (LOTRISONE) cream Apply twice daily, as needed, to affected aresa 45 g 1   IBU 800 MG tablet Take 800 mg by mouth 2 (two) times daily.     losartan-hydrochlorothiazide (HYZAAR) 100-25 MG tablet TAKE 1 TABLET EVERY DAY 90 tablet 10   metFORMIN (GLUCOPHAGE) 1000 MG tablet TAKE 1 TABLET TWICE DAILY WITH MEALS 180 tablet 10   potassium chloride (KLOR-CON M) 10 MEQ tablet TAKE 1 TABLET EVERY DAY 90 tablet 0   pravastatin (PRAVACHOL) 80 MG tablet TAKE 1 TABLET EVERY DAY 90 tablet 1   tadalafil (CIALIS) 20 MG tablet Take 0.5-1 tablets (10-20 mg total) by mouth every other day as needed for erectile dysfunction. 5 tablet 11   terbinafine (LAMISIL) 250 MG tablet Take 1 tablet (250 mg total) by mouth daily. 42 tablet 1   TRUE METRIX BLOOD GLUCOSE TEST test strip TEST BLOOD SUGAR  UP  TO FOUR TIMES DAILY AS DIRECTED 100 strip 0   TRUEplus Lancets 33G MISC 1 each by Does not apply route as directed. 100 each 1   No current facility-administered medications for this visit.    REVIEW OF SYSTEMS:   Constitutional: Denies fevers, chills or abnormal night sweats Eyes: Denies blurriness of vision, double vision or watery eyes Ears, nose, mouth, throat, and face: Denies mucositis or sore throat Respiratory: Denies cough, dyspnea or wheezes Cardiovascular: Denies palpitation, chest discomfort or lower extremity  swelling Gastrointestinal:  Denies nausea, heartburn or change in bowel habits Skin: Denies abnormal skin rashes Lymphatics: Denies new lymphadenopathy or easy bruising Neurological:Denies numbness, tingling or new weaknesses Behavioral/Psych: Mood is stable, no new changes  All other systems were reviewed with the patient and are negative.  PHYSICAL EXAMINATION: ECOG PERFORMANCE STATUS: 1 - Symptomatic but completely ambulatory  Vitals:   08/25/22 1201  BP: (!) 148/89  Pulse: (!) 111  Resp: 18  Temp: 98.2 F (36.8 C)  SpO2: 99%   Filed Weights  08/25/22 1201  Weight: 250 lb 8 oz (113.6 kg)    GENERAL:alert, no distress and comfortable SKIN: skin color, texture, turgor are normal, no rashes or significant lesions EYES: normal, conjunctiva are pink and non-injected, sclera clear OROPHARYNX:no exudate, no erythema and lips, buccal mucosa, and tongue normal  NECK: supple, thyroid normal size, non-tender, without nodularity LYMPH:  no palpable lymphadenopathy in the cervical, axillary or inguinal LUNGS: clear to auscultation and percussion with normal breathing effort HEART: regular rate & rhythm and no murmurs and no lower extremity edema ABDOMEN:abdomen soft, non-tender and normal bowel sounds Musculoskeletal:no cyanosis of digits and no clubbing  PSYCH: alert & oriented x 3 with fluent speech NEURO: no focal motor/sensory deficits  LABORATORY DATA:  I have reviewed the data as listed Recent Results (from the past 2160 hour(s))  PSA     Status: None   Collection Time: 07/25/22  7:35 AM  Result Value Ref Range   Prostate Specific Ag, Serum 1.2 0.0 - 4.0 ng/mL    Comment: Roche ECLIA methodology. According to the American Urological Association, Serum PSA should decrease and remain at undetectable levels after radical prostatectomy. The AUA defines biochemical recurrence as an initial PSA value 0.2 ng/mL or greater followed by a subsequent confirmatory PSA value 0.2  ng/mL or greater. Values obtained with different assay methods or kits cannot be used interchangeably. Results cannot be interpreted as absolute evidence of the presence or absence of malignant disease.   Lipid panel     Status: None   Collection Time: 07/25/22  7:35 AM  Result Value Ref Range   Cholesterol, Total 163 100 - 199 mg/dL   Triglycerides 128 0 - 149 mg/dL   HDL 49 >39 mg/dL   VLDL Cholesterol Cal 23 5 - 40 mg/dL   LDL Chol Calc (NIH) 91 0 - 99 mg/dL   Chol/HDL Ratio 3.3 0.0 - 5.0 ratio    Comment:                                   T. Chol/HDL Ratio                                             Men  Women                               1/2 Avg.Risk  3.4    3.3                                   Avg.Risk  5.0    4.4                                2X Avg.Risk  9.6    7.1                                3X Avg.Risk 23.4   11.0   CMP14+EGFR     Status: Abnormal   Collection Time: 07/25/22  7:35 AM  Result Value Ref Range   Glucose 128 (H) 70 - 99 mg/dL  BUN 13 6 - 24 mg/dL   Creatinine, Ser 0.99 0.76 - 1.27 mg/dL   eGFR 88 >59 mL/min/1.73   BUN/Creatinine Ratio 13 9 - 20   Sodium 140 134 - 144 mmol/L   Potassium 4.0 3.5 - 5.2 mmol/L   Chloride 99 96 - 106 mmol/L   CO2 23 20 - 29 mmol/L   Calcium 9.7 8.7 - 10.2 mg/dL   Total Protein 6.9 6.0 - 8.5 g/dL   Albumin 4.3 3.8 - 4.9 g/dL   Globulin, Total 2.6 1.5 - 4.5 g/dL   Albumin/Globulin Ratio 1.7 1.2 - 2.2   Bilirubin Total 0.9 0.0 - 1.2 mg/dL   Alkaline Phosphatase 79 44 - 121 IU/L   AST 13 0 - 40 IU/L   ALT 11 0 - 44 IU/L  Hemoglobin A1c     Status: Abnormal   Collection Time: 07/25/22  7:35 AM  Result Value Ref Range   Hgb A1c MFr Bld 6.4 (H) 4.8 - 5.6 %    Comment:          Prediabetes: 5.7 - 6.4          Diabetes: >6.4          Glycemic control for adults with diabetes: <7.0    Est. average glucose Bld gHb Est-mCnc 137 mg/dL  HM DIABETES EYE EXAM     Status: None   Collection Time: 08/08/22 12:00 AM  Result  Value Ref Range   HM Diabetic Eye Exam No Retinopathy No Retinopathy  CBC with Differential/Platelet     Status: Abnormal   Collection Time: 08/25/22 12:26 PM  Result Value Ref Range   WBC 11.0 (H) 4.0 - 10.5 K/uL   RBC 5.15 4.22 - 5.81 MIL/uL   Hemoglobin 14.7 13.0 - 17.0 g/dL   HCT 45.0 39.0 - 52.0 %   MCV 87.4 80.0 - 100.0 fL   MCH 28.5 26.0 - 34.0 pg   MCHC 32.7 30.0 - 36.0 g/dL   RDW 14.1 11.5 - 15.5 %   Platelets 391 150 - 400 K/uL   nRBC 0.0 0.0 - 0.2 %   Neutrophils Relative % 60 %   Neutro Abs 6.6 1.7 - 7.7 K/uL   Lymphocytes Relative 33 %   Lymphs Abs 3.6 0.7 - 4.0 K/uL   Monocytes Relative 5 %   Monocytes Absolute 0.6 0.1 - 1.0 K/uL   Eosinophils Relative 1 %   Eosinophils Absolute 0.1 0.0 - 0.5 K/uL   Basophils Relative 1 %   Basophils Absolute 0.1 0.0 - 0.1 K/uL   Immature Granulocytes 0 %   Abs Immature Granulocytes 0.03 0.00 - 0.07 K/uL    Comment: Performed at [facility], [address] [place], [place] [address]    RADIOGRAPHIC STUDIES: I have personally reviewed the radiological images as listed and agreed with the findings in the report. CT CHEST LUNG CA SCREEN LOW DOSE W/O CM  Result Date: 07/28/2022 CLINICAL DATA:  61-year-old male current smoker with 41 pack-year history of smoking. Lung cancer screening examination. EXAM: CT CHEST WITHOUT CONTRAST LOW-DOSE FOR LUNG CANCER SCREENING TECHNIQUE: Multidetector CT imaging of the chest was performed following the standard protocol without IV contrast. RADIATION DOSE REDUCTION: This exam was performed according to the departmental dose-optimization program which includes automated exposure control, adjustment of the mA and/or kV according to patient size and/or use of iterative reconstruction technique. COMPARISON:  Low-dose lung cancer screening chest CT 01/30/2020. FINDINGS: Cardiovascular: Heart size is normal. There is no   significant pericardial fluid, thickening or pericardial calcification. There is aortic  atherosclerosis, as well as atherosclerosis of the great vessels of the mediastinum and the coronary arteries, including calcified atherosclerotic plaque in the right coronary artery. Mediastinum/Nodes: No pathologically enlarged mediastinal or hilar lymph nodes. Please note that accurate exclusion of hilar adenopathy is limited on noncontrast CT scans. Esophagus is unremarkable in appearance. No axillary lymphadenopathy. Lungs/Pleura: Tiny pulmonary nodules are noted in the lungs bilaterally, largest of which is a subpleural nodule in the right middle lobe (axial image 178), with a volume derived mean diameter of 5.1 mm. No larger more suspicious appearing pulmonary nodules or masses are noted. No acute consolidative airspace disease. No pleural effusions. Mild diffuse bronchial wall thickening with mild centrilobular and paraseptal emphysema. Upper Abdomen: Unremarkable. Musculoskeletal: Orthopedic fixation hardware in the lower cervical spine. There are no aggressive appearing lytic or blastic lesions noted in the visualized portions of the skeleton. IMPRESSION: 1. Lung-RADS 2S, benign appearance or behavior. Continue annual screening with low-dose chest CT without contrast in 12 months. 2. The "S" modifier above refers to potentially clinically significant non lung cancer related findings. Specifically, there is aortic atherosclerosis, in addition to right coronary artery disease. Please note that although the presence of coronary artery calcium documents the presence of coronary artery disease, the severity of this disease and any potential stenosis cannot be assessed on this non-gated CT examination. Assessment for potential risk factor modification, dietary therapy or pharmacologic therapy may be warranted, if clinically indicated. 3. Mild diffuse bronchial wall thickening with mild centrilobular and paraseptal emphysema; imaging findings suggestive of underlying COPD. Aortic Atherosclerosis (ICD10-I70.0) and  Emphysema (ICD10-J43.9). Electronically Signed   By: Daniel  Entrikin M.D.   On: 07/28/2022 08:35    ASSESSMENT:  1.  Neutrophilic leukocytosis: - Patient seen at the request of Dr. Simpson. - He has mildly elevated WBC count since 2009, range 11 K-13.9 K.  Predominantly neutrophils. - He denies any recurrent infections.  Denies any fevers or night sweats.  Lost about 15 pounds in the last 6 months but was trying to lose weight with exercise and diet control.  Denies any recent surgeries.  2.  Social/family history: - Lives with wife at home.  Independent of ADLs and IADLs.  He is retired truck driver.  Current active smoker, approximately 1 pack/day for 40 years. - No family history of leukemia or malignancies.   PLAN:  1.  Neutrophilic leukocytosis: - He has very mild leukocytosis, predominantly lymphocytes. - We talked about differential diagnosis.  We will plan to repeat his labs today.  Will also check ESR, CRP.  Will check for myeloproliferative neoplasms with JAK2 [id number] and BCR/ABL by FISH. - RTC 3 weeks for follow-up.  2.  Smoking history: - Low-dose CT scan on 07/27/2022 was lung RADS 2S.  Continue yearly monitoring.  All questions were answered. The patient knows to call the clinic with any problems, questions or concerns.      Sreedhar Katragadda, MD 08/25/22 12:58 PM   

## 2022-08-25 NOTE — Patient Instructions (Signed)
[facility]  Discharge Instructions  You were seen today by Dr. Delton Coombes. He is a hematologist or blood specialist.  He discussed your personal history, family history and the events that led to your visit today.  He wants to do some additional lab work today.  We will see you back in a few weeks to review/discuss the results.     Thank you for choosing [facility] to provide your oncology and hematology care.   To afford each patient quality time with our provider, please arrive at least 15 minutes before your scheduled appointment time. You may need to reschedule your appointment if you arrive late (10 or more minutes). Arriving late affects you and other patients whose appointments are after yours.  Also, if you miss three or more appointments without notifying the office, you may be dismissed from the clinic at the provider's discretion.    Again, thank you for choosing [facility].  Our hope is that these requests will decrease the amount of time that you wait before being seen by our physicians.   If you have a lab appointment with the [facility] please come in thru the Main Entrance and check in at the main information desk.           _____________________________________________________________  Should you have questions after your visit to [facility], please contact our office at [phone] and follow the prompts.  Our office hours are 8:00 a.m. to 4:30 p.m. Monday - Thursday and 8:00 a.m. to 2:30 p.m. Friday.  Please note that voicemails left after 4:00 p.m. may not be returned until the following business day.  We are closed weekends and all major holidays.  You do have access to a nurse 24-7, just call the main number to the clinic [phone] and do not press any options, hold on the line and a nurse will answer the phone.    For prescription refill requests, have your pharmacy contact our  office and allow 72 hours.    Masks are optional in the cancer centers. If you would like for your care team to wear a mask while they are taking care of you, please let them know. You may have one support person who is at least 59 years old accompany you for your appointments.

## 2022-08-30 LAB — BCR-ABL1 FISH
Cells Analyzed: 200
Cells Counted: 200

## 2022-09-01 LAB — JAK2 V617F RFX CALR/MPL/E12-15

## 2022-09-01 LAB — CALR +MPL + E12-E15  (REFLEX)

## 2022-09-13 ENCOUNTER — Other Ambulatory Visit: Payer: Self-pay | Admitting: Family Medicine

## 2022-09-14 NOTE — Progress Notes (Signed)
Patient notified of LDCT Lung Cancer Screening Results via mail with the recommendation to follow-up in 12 months. Patient's referring provider has been sent a copy of results. Results are as follows:   IMPRESSION: 1. Lung-RADS 2S, benign appearance or behavior. Continue annual screening with low-dose chest CT without contrast in 12 months. 2. The "S" modifier above refers to potentially clinically significant non lung cancer related findings. Specifically, there is aortic atherosclerosis, in addition to right coronary artery disease. Please note that although the presence of coronary artery calcium documents the presence of coronary artery disease, the severity of this disease and any potential stenosis cannot be assessed on this non-gated CT examination. Assessment for potential risk factor modification, dietary therapy or pharmacologic therapy may be warranted, if clinically indicated. 3. Mild diffuse bronchial wall thickening with mild centrilobular and paraseptal emphysema; imaging findings suggestive of underlying COPD.   Aortic Atherosclerosis (ICD10-I70.0) and Emphysema (ICD10-J43.9).

## 2022-09-18 ENCOUNTER — Telehealth: Payer: Self-pay | Admitting: Family Medicine

## 2022-09-18 DIAGNOSIS — I2510 Atherosclerotic heart disease of native coronary artery without angina pectoris: Secondary | ICD-10-CM

## 2022-09-18 DIAGNOSIS — R9389 Abnormal findings on diagnostic imaging of other specified body structures: Secondary | ICD-10-CM

## 2022-09-18 DIAGNOSIS — Z9189 Other specified personal risk factors, not elsewhere classified: Secondary | ICD-10-CM

## 2022-09-18 NOTE — Telephone Encounter (Signed)
Pt aware.

## 2022-09-18 NOTE — Telephone Encounter (Signed)
Please call and let pt know that his chest scan in November 2023 showed a lot of vascular disease ( " hardening of the arteries") involving his main artery, the  aorta, as well as some  blood vessels, arteries , in his heart, he needs Cardiology to do additional tests to determine if he does have significant heart disease  In the mean time needs to keep blood pressure, blood sugar and cholesterol down and commit to and STOP smoking Keep active I have referred him to Card in [place] important he goes

## 2022-09-21 ENCOUNTER — Ambulatory Visit: Payer: Medicare HMO | Attending: Cardiology | Admitting: Cardiology

## 2022-09-21 ENCOUNTER — Encounter: Payer: Self-pay | Admitting: Cardiology

## 2022-09-21 VITALS — BP 145/80 | HR 91 | Ht 72.0 in | Wt 253.8 lb

## 2022-09-21 DIAGNOSIS — I10 Essential (primary) hypertension: Secondary | ICD-10-CM

## 2022-09-21 DIAGNOSIS — E782 Mixed hyperlipidemia: Secondary | ICD-10-CM

## 2022-09-21 DIAGNOSIS — I2510 Atherosclerotic heart disease of native coronary artery without angina pectoris: Secondary | ICD-10-CM | POA: Diagnosis not present

## 2022-09-21 MED ORDER — ROSUVASTATIN CALCIUM 10 MG PO TABS: 10.0000 mg | ORAL_TABLET | Freq: Every day | ORAL | 3 refills | Status: DC

## 2022-09-21 NOTE — Progress Notes (Signed)
Cardiology Office Note  Date: 09/21/2022   ID: Dakota Mendez, DOB 1961/06/08, MRN [id number]  PCP:  Fayrene Helper, MD  Cardiologist:  Rozann Lesches, MD Electrophysiologist:  None   Chief Complaint  Patient presents with   Referred for cardiac evaluation    History of Present Illness: Dakota Mendez is a 62 y.o. male referred for cardiology consultation by Dr. Moshe Cipro due to finding of coronary artery and aortic calcification by screening chest CT in November 2023.  He presents reporting no exertional chest pain, NYHA class I-II dyspnea.  Over the last year he has had a very regular walking regimen.  He reports losing approximately 75 pounds along with diet, feels much better in general.  I went over his medications which are noted below.  He reports compliance with therapy.  He did try Jardiance however it had to be discontinued due to UTIs.  He is following blood sugars and blood pressure with Dr. Moshe Cipro.  I reviewed his most recent lab work.  His last LDL was 91 on Pravachol 80 mg daily which has been on for years.  We discussed rationale for intensifying therapy in the presence of documented atherosclerosis.  He has not undergone any formal ischemic testing.  I personally reviewed his ECG today which shows sinus rhythm with nonspecific T wave changes.   Past Medical History:  Diagnosis Date   Chronic back pain    Chronic neck pain    Diabetes mellitus    Hyperlipidemia    Hypertension    Obesity    Sleep apnea    Not compliant with CPAP    Past Surgical History:  Procedure Laterality Date   CERVICAL SPINE SURGERY     COLONOSCOPY N/A 08/08/2017   Procedure: COLONOSCOPY;  Surgeon: Rogene Houston, MD;  [facility];  Service: Endoscopy;  Laterality: N/A;  [place]  09/21/2011   cervial spine    Current Outpatient Medications  Medication Sig Dispense Refill   Alcohol Swabs PADS 1 each by Does not apply route as directed. 100 each 0    amLODipine (NORVASC) 10 MG tablet TAKE 1 TABLET EVERY DAY 90 tablet 3   Blood Glucose Monitoring Suppl (TRUE METRIX METER) w/Device KIT USE AS DIRECTED 1 kit 0   cloNIDine (CATAPRES) 0.3 MG tablet TAKE 1 TABLET EVERY DAY 90 tablet 10   clotrimazole-betamethasone (LOTRISONE) cream Apply twice daily, as needed, to affected aresa 45 g 1   IBU 800 MG tablet Take 800 mg by mouth 2 (two) times daily.     losartan-hydrochlorothiazide (HYZAAR) 100-25 MG tablet TAKE 1 TABLET EVERY DAY 90 tablet 10   metFORMIN (GLUCOPHAGE) 1000 MG tablet TAKE 1 TABLET TWICE DAILY WITH MEALS 180 tablet 10   potassium chloride (KLOR-CON M) 10 MEQ tablet TAKE 1 TABLET EVERY DAY 90 tablet 0   rosuvastatin (CRESTOR) 10 MG tablet Take 1 tablet (10 mg total) by mouth daily. 90 tablet 3   TRUE METRIX BLOOD GLUCOSE TEST test strip TEST BLOOD SUGAR  UP  TO FOUR TIMES DAILY AS DIRECTED 100 strip 0   TRUEplus Lancets 33G MISC 1 each by Does not apply route as directed. 100 each 1   No current facility-administered medications for this visit.   Allergies:  Jardiance [empagliflozin]   Social History: The patient  reports that he has been smoking cigarettes. He has a 20.00 pack-year smoking history. He has never used smokeless tobacco. He reports that he does not  drink alcohol and does not use drugs.   Family History: The patient's family history includes Cirrhosis in his brother and father; Depression in his mother; Diabetes in his sister; Hearing loss in his brother; Heart attack in his mother; Hypertension in his son; Sleep apnea in his son; Stroke in his mother.   ROS: No palpitations or syncope.  Physical Exam: VS:  BP (!) 145/80   Pulse 91   Ht 6' (1.829 m)   Wt 253 lb 12.8 oz (115.1 kg)   SpO2 99%   BMI 34.42 kg/m , BMI Body mass index is 34.42 kg/m.  Wt Readings from Last 3 Encounters:  09/21/22 253 lb 12.8 oz (115.1 kg)  08/25/22 250 lb 8 oz (113.6 kg)  07/26/22 251 lb 1.9 oz (113.9 kg)    General: Patient  appears comfortable at rest. HEENT: Conjunctiva and lids normal. Neck: Supple, no elevated JVP or carotid bruits. Lungs: Clear to auscultation, nonlabored breathing at rest. Cardiac: Regular rate and rhythm, no S3, 1/6 systolic murmur. Abdomen: Soft, nontender, bowel sounds present. Extremities: No pitting edema, distal pulses 2+. Skin: Warm and dry. Musculoskeletal: No kyphosis. Neuropsychiatric: Alert and oriented x3, affect grossly appropriate.  ECG:  An ECG dated 07/25/2017 was personally reviewed today and demonstrated:  Sinus tachycardia with nonspecific T wave changes.  Recent Labwork: 03/01/2022: TSH 3.480 07/25/2022: ALT 11; AST 13; BUN 13; Creatinine, Ser 0.99; Potassium 4.0; Sodium 140 08/25/2022: Hemoglobin 14.7; Platelets 391     Component Value Date/Time   CHOL 163 07/25/2022 0735   TRIG 128 07/25/2022 0735   HDL 49 07/25/2022 0735   CHOLHDL 3.3 07/25/2022 0735   CHOLHDL 4.5 08/12/2019 0736   VLDL 63 (H) 02/13/2017 0720   LDLCALC 91 07/25/2022 0735   LDLCALC 115 (H) 08/12/2019 0736    Other Studies Reviewed Today:  Chest CT 07/27/2022. IMPRESSION: 1. Lung-RADS 2S, benign appearance or behavior. Continue annual screening with low-dose chest CT without contrast in 12 months. 2. The "S" modifier above refers to potentially clinically significant non lung cancer related findings. Specifically, there is aortic atherosclerosis, in addition to right coronary artery disease. Please note that although the presence of coronary artery calcium documents the presence of coronary artery disease, the severity of this disease and any potential stenosis cannot be assessed on this non-gated CT examination. Assessment for potential risk factor modification, dietary therapy or pharmacologic therapy may be warranted, if clinically indicated. 3. Mild diffuse bronchial wall thickening with mild centrilobular and paraseptal emphysema; imaging findings suggestive of  underlying COPD.   Aortic Atherosclerosis (ICD10-I70.0) and Emphysema (ICD10-J43.9).  Assessment and Plan:  1.  Coronary artery and aortic calcification by CT imaging.  ECG is normal.  He has a longstanding history of hypertension, also hyperlipidemia and prior tobacco use.  He has been walking for exercise and is working on significant weight loss.  No formal ischemic testing to date and we will arrange an exercise Myoview to exclude any substantial ischemic territories that would require further evaluation.  Otherwise plan on risk factor modification.  We will switch his Pravachol to Crestor 10 mg daily.  Recheck lipid panel in 3 months.  2.  Essential hypertension on multimodal therapy.  Currently tolerating Hyzaar, clonidine, and Norvasc.  Further weight loss would be beneficial as well.  Keep follow-up with Dr. Moshe Cipro.  3.  Type 2 diabetes mellitus, did not tolerate Jardiance due to UTIs.  Continue present regimen with follow-up by Dr. Moshe Cipro.  Medication Adjustments/Labs and Tests Ordered: Current  medicines are reviewed at length with the patient today.  Concerns regarding medicines are outlined above.   Tests Ordered: Orders Placed This Encounter  Procedures   NM Myocar Multi W/Spect W/Wall Motion / EF   Lipid Profile   EKG 12-Lead    Medication Changes: Meds ordered this encounter  Medications   rosuvastatin (CRESTOR) 10 MG tablet    Sig: Take 1 tablet (10 mg total) by mouth daily.    Dispense:  90 tablet    Refill:  3    09/21/22 stopped pravastatin    Disposition:  Follow up  test results.  Signed, Satira Sark, MD, [facility] 09/21/2022 2:12 PM    [facility] at [facility] [address] [place], [place] [address] Phone: [phone]; Fax: [phone]

## 2022-09-21 NOTE — Patient Instructions (Signed)
Medication Instructions:  STOP Pravastatin  START Crestor 10 mg daily at bedtime  Labwork: Lipids in 3 months- early April   Testing/Procedures: Your physician has requested that you have en exercise stress myoview. For further information please visit [email or web address]. Please follow instruction sheet, as given.   HOLD AMLODIPINE THE MORNING OF TEST  Follow-Up: WE WILL CALL YOU WITH RESULTS  Any Other Special Instructions Will Be Listed Below (If Applicable).  If you need a refill on your cardiac medications before your next appointment, please call your pharmacy.

## 2022-09-22 NOTE — Progress Notes (Unsigned)
[place], [place] [address]   CLINIC:  Medical Oncology/Hematology  PCP:  [place] [address], [place] [address] [phone]   REASON FOR VISIT:  Follow-up for leukocytosis  PRIOR THERAPY: None  CURRENT THERAPY: Under workup  INTERVAL HISTORY:   Dakota Mendez 62 y.o. male returns for routine follow-up of leukocytosis.  He was seen for initial consultation by Dr. Delton Coombes on 08/25/2022.  At today's visit, he reports feeling well.  He denies any changes in his health or symptoms since his visit with Dr. Delton Coombes last month.  He denies any recurrent infections. Denies any fevers or night sweats. Lost about 15 pounds in the last 6 months but was trying to lose weight with exercise and diet control.  Patient does not take any steroid medications.  No prior splenectomy.  No known history of chronic inflammatory or autoimmune disorders.  He has 100% energy and 100% appetite. He endorses that he is maintaining a stable weight.   ASSESSMENT & PLAN:  1.  Leukocytosis, predominantly neutrophilic - Mildly elevated WBC since 2009, ranging from 11,000-13,900, predominantly neutrophils but with occasional lymphocytosis - He denies any recurrent infections.  No steroid medications.  No prior splenectomy.  No known history of inflammatory/autoimmune disease. - Denies any fevers or night sweats. Lost about 15 pounds in the last 6 months but was trying to lose weight with exercise and diet control.  - Current active smoker, approximately 1 PPD x 40 years - Hematology workup (08/25/2022): BCR ABL FISH negative Negative JAK2, CALR, MPL Normal CRP.  Mildly elevated ESR 19.  LDH normal. - Most recent CBC (08/25/2022): WBC 11.0, normal differential - PLAN: MPN workup negative.  Suspect reactive leukocytosis. - Labs only (CBC/D) in 6 months - Same-day labs (CBC/D) + OFFICE visit in 1 year - Will consider bone marrow biopsy if  any major deviations from baseline.  Otherwise will consider discharge from clinic in 1 year if no major changes.  2.  Tobacco use -Current active smoker, approximately 1 PPD x 40 years - Low-dose CT scan on 07/27/2022 was lung RADS 2S - PLAN: Continue annual monitoring - We discussed smoking cessation and patient was provided with smoking cessation education resources  3.   Social/family history: - PMH: Chronic neck/back pain, diabetes, hyperlipidemia, hypertension, obesity, sleep apnea - Lives with wife at home.  Independent of ADLs and IADLs.  He is retired Administrator.  Current active smoker, approximately 1 pack/day for 40 years. - No family history of leukemia or malignancies.   PLAN SUMMARY: >> Labs only (CBC/D) in 6 months >> Same-day labs (CBC/D) + OFFICE visit in 1 year    REVIEW OF SYSTEMS: Patient denies any acute complaints at today's visit  Review of Systems  Constitutional:  Negative for appetite change, chills, diaphoresis, fatigue, fever and unexpected weight change.  HENT:   Negative for lump/mass and nosebleeds.   Eyes:  Negative for eye problems.  Respiratory:  Negative for cough, hemoptysis and shortness of breath.   Cardiovascular:  Negative for chest pain, leg swelling and palpitations.  Gastrointestinal:  Negative for abdominal pain, blood in stool, constipation, diarrhea, nausea and vomiting.  Genitourinary:  Negative for hematuria.   Skin: Negative.   Neurological:  Negative for dizziness, headaches and light-headedness.  Hematological:  Does not bruise/bleed easily.     PHYSICAL EXAM:  ECOG PERFORMANCE STATUS: 0 - Asymptomatic  There were no vitals filed for this  visit. There were no vitals filed for this visit. Physical Exam Constitutional:      Appearance: Normal appearance. He is obese.  HENT:     Head: Normocephalic and atraumatic.     Mouth/Throat:     Mouth: Mucous membranes are moist.  Eyes:     Extraocular Movements: Extraocular  movements intact.     Pupils: Pupils are equal, round, and reactive to light.  Cardiovascular:     Rate and Rhythm: Normal rate and regular rhythm.     Pulses: Normal pulses.     Heart sounds: Normal heart sounds.  Pulmonary:     Effort: Pulmonary effort is normal.     Breath sounds: Normal breath sounds.  Abdominal:     General: Bowel sounds are normal.     Palpations: Abdomen is soft.     Tenderness: There is no abdominal tenderness.  Musculoskeletal:        General: No swelling.     Right lower leg: No edema.     Left lower leg: No edema.  Lymphadenopathy:     Cervical: No cervical adenopathy.  Skin:    General: Skin is warm and dry.  Neurological:     General: No focal deficit present.     Mental Status: He is alert and oriented to person, place, and time.  Psychiatric:        Mood and Affect: Mood normal.        Behavior: Behavior normal.     PAST MEDICAL/SURGICAL HISTORY:  Past Medical History:  Diagnosis Date   Chronic back pain    Chronic neck pain    Diabetes mellitus    Hyperlipidemia    Hypertension    Obesity    Sleep apnea    Not compliant with CPAP   Past Surgical History:  Procedure Laterality Date   CERVICAL SPINE SURGERY     COLONOSCOPY N/A 08/08/2017   Procedure: COLONOSCOPY;  Surgeon: Rogene Houston, MD;  [facility];  Service: Endoscopy;  Laterality: N/A;  [place]  09/21/2011   cervial spine    SOCIAL HISTORY:  Social History   Socioeconomic History   Marital status: Married    Spouse name: Not on file   Number of children: 4   Years of education: Not on file   Highest education level: 10th grade  Occupational History   Occupation: disabled-truck dirver  Tobacco Use   Smoking status: Every Day    Packs/day: 0.50    Years: 40.00    Total pack years: 20.00    Types: Cigarettes   Smokeless tobacco: Never   Tobacco comments:    1 PPD for 35 years  Vaping Use   Vaping Use: Never used  Substance and Sexual  Activity   Alcohol use: No   Drug use: No   Sexual activity: Yes  Other Topics Concern   Not on file  Social History Narrative   Not on file   Social Determinants of Health   Financial Resource Strain: Low Risk  (04/10/2022)   Overall Financial Resource Strain (CARDIA)    Difficulty of Paying Living Expenses: Not hard at all  Food Insecurity: No Food Insecurity (08/25/2022)   Hunger Vital Sign    Worried About Running Out of Food in the Last Year: Never true    Ran Out of Food in the Last Year: Never true  Transportation Needs: No Transportation Needs (04/10/2022)   PRAPARE - Transportation  Lack of Transportation (Medical): No    Lack of Transportation (Non-Medical): No  Physical Activity: Sufficiently Active (04/10/2022)   Exercise Vital Sign    Days of Exercise per Week: 5 days    Minutes of Exercise per Session: 30 min  Stress: No Stress Concern Present (04/10/2022)   [place]    Feeling of Stress : Not at all  Social Connections: Moderately Integrated (04/10/2022)   Social Connection and Isolation Panel [NHANES]    Frequency of Communication with Friends and Family: More than three times a week    Frequency of Social Gatherings with Friends and Family: Three times a week    Attends Religious Services: More than 4 times per year    Active Member of Clubs or Organizations: No    Attends Archivist Meetings: Never    Marital Status: Married  Human resources officer Violence: Not At Risk (08/25/2022)   Humiliation, Afraid, Rape, and Kick questionnaire    Fear of Current or Ex-Partner: No    Emotionally Abused: No    Physically Abused: No    Sexually Abused: No    FAMILY HISTORY:  Family History  Problem Relation Age of Onset   Cirrhosis Father    Heart attack Mother    Depression Mother    Stroke Mother    Hearing loss Brother    Cirrhosis Brother    Diabetes Sister    Sleep apnea Son     Hypertension Son     CURRENT MEDICATIONS:  Outpatient Encounter Medications as of 09/25/2022  Medication Sig   Alcohol Swabs PADS 1 each by Does not apply route as directed.   amLODipine (NORVASC) 10 MG tablet TAKE 1 TABLET EVERY DAY   Blood Glucose Monitoring Suppl (TRUE METRIX METER) w/Device KIT USE AS DIRECTED   cloNIDine (CATAPRES) 0.3 MG tablet TAKE 1 TABLET EVERY DAY   clotrimazole-betamethasone (LOTRISONE) cream Apply twice daily, as needed, to affected aresa   IBU 800 MG tablet Take 800 mg by mouth 2 (two) times daily.   losartan-hydrochlorothiazide (HYZAAR) 100-25 MG tablet TAKE 1 TABLET EVERY DAY   metFORMIN (GLUCOPHAGE) 1000 MG tablet TAKE 1 TABLET TWICE DAILY WITH MEALS   potassium chloride (KLOR-CON M) 10 MEQ tablet TAKE 1 TABLET EVERY DAY   rosuvastatin (CRESTOR) 10 MG tablet Take 1 tablet (10 mg total) by mouth daily.   TRUE METRIX BLOOD GLUCOSE TEST test strip TEST BLOOD SUGAR  UP  TO FOUR TIMES DAILY AS DIRECTED   TRUEplus Lancets 33G MISC 1 each by Does not apply route as directed.   No facility-administered encounter medications on file as of 09/25/2022.    ALLERGIES:  Allergies  Allergen Reactions   Jardiance [Empagliflozin] Other (See Comments)    Redness and irritation of genitals    LABORATORY DATA:  I have reviewed the labs as listed.  CBC    Component Value Date/Time   WBC 11.0 (H) 08/25/2022 1226   RBC 5.15 08/25/2022 1226   HGB 14.7 08/25/2022 1226   HGB 14.0 03/01/2022 0801   HCT 45.0 08/25/2022 1226   HCT 42.9 03/01/2022 0801   PLT 391 08/25/2022 1226   PLT 364 03/01/2022 0801   MCV 87.4 08/25/2022 1226   MCV 85 03/01/2022 0801   MCV 87 09/18/2011 0834   MCH 28.5 08/25/2022 1226   MCHC 32.7 08/25/2022 1226   RDW 14.1 08/25/2022 1226   RDW 14.1 03/01/2022 0801   RDW 13.5 09/18/2011 0834  LYMPHSABS 3.6 08/25/2022 1226   MONOABS 0.6 08/25/2022 1226   EOSABS 0.1 08/25/2022 1226   BASOSABS 0.1 08/25/2022 1226      Latest Ref Rng & Units  07/25/2022    7:35 AM 03/01/2022    8:01 AM 10/27/2021    7:41 AM  CMP  Glucose 70 - 99 mg/dL 128  133  123   BUN 6 - 24 mg/dL '13  16  15   '$ Creatinine 0.76 - 1.27 mg/dL 0.99  0.99  1.07   Sodium 134 - 144 mmol/L 140  140  140   Potassium 3.5 - 5.2 mmol/L 4.0  4.3  4.1   Chloride 96 - 106 mmol/L 99  99  100   CO2 20 - 29 mmol/L '23  25  24   '$ Calcium 8.7 - 10.2 mg/dL 9.7  9.4  9.5   Total Protein 6.0 - 8.5 g/dL 6.9   6.8   Total Bilirubin 0.0 - 1.2 mg/dL 0.9   0.9   Alkaline Phos 44 - 121 IU/L 79   79   AST 0 - 40 IU/L 13   14   ALT 0 - 44 IU/L 11   12     DIAGNOSTIC IMAGING:  I have independently reviewed the relevant imaging and discussed with the patient.   WRAP UP:  All questions were answered. The patient knows to call the clinic with any problems, questions or concerns.  Medical decision making: Low  Time spent on visit: I spent 15 minutes counseling the patient face to face. The total time spent in the appointment was 22 minutes and more than 50% was on counseling.  Harriett Rush, PA-C  09/25/2022 8:48 AM

## 2022-09-25 ENCOUNTER — Inpatient Hospital Stay: Payer: Medicare HMO | Attending: Hematology | Admitting: Physician Assistant

## 2022-09-25 ENCOUNTER — Other Ambulatory Visit: Payer: Self-pay

## 2022-09-25 VITALS — BP 135/86 | HR 96 | Temp 97.6°F | Resp 18 | Ht 72.0 in | Wt 252.0 lb

## 2022-09-25 DIAGNOSIS — E669 Obesity, unspecified: Secondary | ICD-10-CM | POA: Insufficient documentation

## 2022-09-25 DIAGNOSIS — I10 Essential (primary) hypertension: Secondary | ICD-10-CM | POA: Diagnosis not present

## 2022-09-25 DIAGNOSIS — D72828 Other elevated white blood cell count: Secondary | ICD-10-CM | POA: Diagnosis not present

## 2022-09-25 DIAGNOSIS — E119 Type 2 diabetes mellitus without complications: Secondary | ICD-10-CM | POA: Diagnosis not present

## 2022-09-25 DIAGNOSIS — R700 Elevated erythrocyte sedimentation rate: Secondary | ICD-10-CM | POA: Insufficient documentation

## 2022-09-25 DIAGNOSIS — G8929 Other chronic pain: Secondary | ICD-10-CM | POA: Diagnosis not present

## 2022-09-25 DIAGNOSIS — Z87891 Personal history of nicotine dependence: Secondary | ICD-10-CM | POA: Diagnosis not present

## 2022-09-25 DIAGNOSIS — E785 Hyperlipidemia, unspecified: Secondary | ICD-10-CM | POA: Diagnosis not present

## 2022-09-25 DIAGNOSIS — G4730 Sleep apnea, unspecified: Secondary | ICD-10-CM | POA: Diagnosis not present

## 2022-09-25 DIAGNOSIS — D72829 Elevated white blood cell count, unspecified: Secondary | ICD-10-CM

## 2022-09-25 DIAGNOSIS — F17210 Nicotine dependence, cigarettes, uncomplicated: Secondary | ICD-10-CM | POA: Diagnosis not present

## 2022-09-25 NOTE — Patient Instructions (Signed)
[facility] **   You were seen today by Tarri Abernethy PA-C for your elevated white blood cells.    HIGH WHITE BLOOD CELLS Your lab work did NOT show any signs of genetic mutation or cancer as cause of your high white blood cells. Your high white blood cells are most likely "reactive" due to inflammation in your body caused by smoking and tobacco use.  TOBACCO USE Look up "Helper" online.  This will give you information on FREE classes on smoking cessation that have a 65% success rate. You can also speak to your primary care doctor about certain prescription medications that may help you to quit smoking.  LABS: Return in 6 months for repeat labs (labs only, no office visit at that time)  FOLLOW-UP APPOINTMENT: Same-day labs and office visit in 1 year  ** Thank you for trusting me with your healthcare!  I strive to provide all of my patients with quality care at each visit.  If you receive a survey for this visit, I would be so grateful to you for taking the time to provide feedback.  Thank you in advance!  ~ Jamyrah Saur                   Dr. Derek Ayodeji   &   Tarri Abernethy, PA-C   - - - - - - - - - - - - - - - - - -    Thank you for choosing [facility] to provide your oncology and hematology care.  To afford each patient quality time with our provider, please arrive at least 15 minutes before your scheduled appointment time.   If you have a lab appointment with the [facility] please come in thru the Main Entrance and check in at the main information desk.  You need to re-schedule your appointment should you arrive 10 or more minutes late.  We strive to give you quality time with our providers, and arriving late affects you and other patients whose appointments are after yours.  Also, if you no show three or more times for appointments you may  be dismissed from the clinic at the providers discretion.     Again, thank you for choosing [facility].  Our hope is that these requests will decrease the amount of time that you wait before being seen by our physicians.       _____________________________________________________________  Should you have questions after your visit to [facility], please contact our office at [phone] and follow the prompts.  Our office hours are 8:00 a.m. and 4:30 p.m. Monday - Friday.  Please note that voicemails left after 4:00 p.m. may not be returned until the following business day.  We are closed weekends and major holidays.  You do have access to a nurse 24-7, just call the main number to the clinic [phone] and do not press any options, hold on the line and a nurse will answer the phone.    For prescription refill requests, have your pharmacy contact our office and allow 72 hours.

## 2022-10-03 ENCOUNTER — Encounter (HOSPITAL_COMMUNITY): Admission: RE | Admit: 2022-10-03 | Payer: Medicare HMO | Source: Ambulatory Visit

## 2022-10-03 ENCOUNTER — Encounter (HOSPITAL_COMMUNITY): Payer: Medicare HMO

## 2022-11-29 ENCOUNTER — Other Ambulatory Visit: Payer: Self-pay | Admitting: Family Medicine

## 2022-12-20 ENCOUNTER — Encounter: Payer: Self-pay | Admitting: Family Medicine

## 2022-12-20 ENCOUNTER — Ambulatory Visit (INDEPENDENT_AMBULATORY_CARE_PROVIDER_SITE_OTHER): Payer: Medicare HMO | Admitting: Family Medicine

## 2022-12-20 VITALS — BP 122/88 | HR 86 | Ht 72.0 in | Wt 261.0 lb

## 2022-12-20 DIAGNOSIS — Z23 Encounter for immunization: Secondary | ICD-10-CM | POA: Diagnosis not present

## 2022-12-20 DIAGNOSIS — E785 Hyperlipidemia, unspecified: Secondary | ICD-10-CM | POA: Diagnosis not present

## 2022-12-20 DIAGNOSIS — E1159 Type 2 diabetes mellitus with other circulatory complications: Secondary | ICD-10-CM

## 2022-12-20 DIAGNOSIS — I10 Essential (primary) hypertension: Secondary | ICD-10-CM | POA: Diagnosis not present

## 2022-12-20 DIAGNOSIS — Z720 Tobacco use: Secondary | ICD-10-CM | POA: Diagnosis not present

## 2022-12-20 MED ORDER — CLONIDINE HCL 0.3 MG PO TABS: 0.3000 mg | ORAL_TABLET | Freq: Every day | ORAL | 3 refills | Status: DC

## 2022-12-20 MED ORDER — UNABLE TO FIND
0 refills | Status: DC
Start: 2022-12-20 — End: 2024-04-21

## 2022-12-20 NOTE — Assessment & Plan Note (Signed)
10 pound weight gain  Patient re-educated about  the importance of commitment to a  minimum of 150 minutes of exercise per week as able.  The importance of healthy food choices with portion control discussed, as well as eating regularly and within a 12 hour window most days. The need to choose "clean , green" food 50 to 75% of the time is discussed, as well as to make water the primary drink and set a goal of 64 ounces water daily.       12/20/2022    8:08 AM 09/25/2022    8:13 AM 09/21/2022    1:01 PM  Weight /BMI  Weight 261 lb 0.6 oz 252 lb 253 lb 12.8 oz  Height 6' (1.829 m) 6' (1.829 m) 6' (1.829 m)  BMI 35.4 kg/m2 34.18 kg/m2 34.42 kg/m2

## 2022-12-20 NOTE — Assessment & Plan Note (Signed)
Dakota Mendez is reminded of the importance of commitment to daily physical activity for 30 minutes or more, as able and the need to limit carbohydrate intake to 30 to 60 grams per meal to help with blood sugar control.   The need to take medication as prescribed, test blood sugar as directed, and to call between visits if there is a concern that blood sugar is uncontrolled is also discussed.   Dakota Mendez is reminded of the importance of daily foot exam, annual eye examination, and good blood sugar, blood pressure and cholesterol control.     Latest Ref Rng & Units 07/25/2022    7:35 AM 03/03/2022    8:48 AM 03/01/2022    8:01 AM 10/27/2021    7:41 AM 09/19/2021    8:04 AM  Diabetic Labs  HbA1c 4.8 - 5.6 % 6.4   6.4  6.9    Micro/Creat Ratio 0 - 29 mg/g creat  28      Chol 100 - 199 mg/dL 570    177    HDL >93 mg/dL 49    52    Calc LDL 0 - 99 mg/dL 91    86    Triglycerides 0 - 149 mg/dL 903    009    Creatinine 0.76 - 1.27 mg/dL 2.33   0.07  6.22  6.33       12/20/2022    8:09 AM 12/20/2022    8:08 AM 09/25/2022    8:13 AM 09/21/2022    1:01 PM 08/25/2022   12:01 PM 07/26/2022    8:33 AM 07/26/2022    8:05 AM  BP/Weight  Systolic BP 122 146 135 145 148 128 130  Diastolic BP 88 87 86 80 89 80 84  Wt. (Lbs)  261.04 252 253.8 250.5  251.12  BMI  35.4 kg/m2 34.18 kg/m2 34.42 kg/m2 33.97 kg/m2  34.06 kg/m2      Latest Ref Rng & Units 08/08/2022   12:00 AM 07/22/2021    3:00 PM  Foot/eye exam completion dates  Eye Exam No Retinopathy No Retinopathy       Foot Form Completion   Done     This result is from an external source.      Updated lab needed at/ before next visit.

## 2022-12-20 NOTE — Assessment & Plan Note (Signed)
Controlled, no change in medication DASH diet and commitment to daily physical activity for a minimum of 30 minutes discussed and encouraged, as a part of hypertension management. The importance of attaining a healthy weight is also discussed.     12/20/2022    8:09 AM 12/20/2022    8:08 AM 09/25/2022    8:13 AM 09/21/2022    1:01 PM 08/25/2022   12:01 PM 07/26/2022    8:33 AM 07/26/2022    8:05 AM  BP/Weight  Systolic BP 122 146 135 145 148 128 130  Diastolic BP 88 87 86 80 89 80 84  Wt. (Lbs)  261.04 252 253.8 250.5  251.12  BMI  35.4 kg/m2 34.18 kg/m2 34.42 kg/m2 33.97 kg/m2  34.06 kg/m2

## 2022-12-20 NOTE — Progress Notes (Signed)
Dakota Mendez     MRN: [id number]      DOB: 12-27-1960   HPI Dakota Mendez is here for follow up and re-evaluation of chronic medical conditions, medication management and review of any available recent lab and radiology data.  Preventive health is updated, specifically  Cancer screening and Immunization.   Questions or concerns regarding consultations or procedures which the PT has had in the interim are  addressed. The PT denies any adverse reactions to current medications since the last visit.  Stopped smoking 09/29/2022 ROS Denies recent fever or chills. Denies sinus pressure, nasal congestion, ear pain or sore throat. Denies chest congestion, productive cough or wheezing. Denies chest pains, palpitations and leg swelling Denies abdominal pain, nausea, vomiting,diarrhea or constipation.   Denies dysuria, frequency, hesitancy or incontinence. Denies joint pain, swelling and limitation in mobility. Denies headaches, seizures, numbness, or tingling. Denies depression, anxiety or insomnia. Denies skin break down or rash.   PE  BP 122/88 (BP Location: Left Arm, Patient Position: Sitting, Cuff Size: Large)   Pulse 86   Ht 6' (1.829 m)   Wt 261 lb 0.6 oz (118.4 kg)   SpO2 97%   BMI 35.40 kg/m   Patient alert and oriented and in no cardiopulmonary distress.  HEENT: No facial asymmetry, EOMI,     Neck supple .  Chest: Clear to auscultation bilaterally.  CVS: S1, S2 no murmurs, no S3.Regular rate.  ABD: Soft non tender.   Ext: No edema  MS: Adequate ROM spine, shoulders, hips and knees.  Skin: Intact, no ulcerations or rash noted.  Psych: Good eye contact, normal affect. Memory intact not anxious or depressed appearing.  CNS: CN 2-12 intact, power,  normal throughout.no focal deficits noted.   Assessment & Plan  Type 2 diabetes mellitus with vascular disease [facility]) Dakota Mendez is reminded of the importance of commitment to daily physical activity for 30 minutes or  more, as able and the need to limit carbohydrate intake to 30 to 60 grams per meal to help with blood sugar control.   The need to take medication as prescribed, test blood sugar as directed, and to call between visits if there is a concern that blood sugar is uncontrolled is also discussed.   Dakota Mendez is reminded of the importance of daily foot exam, annual eye examination, and good blood sugar, blood pressure and cholesterol control.     Latest Ref Rng & Units 07/25/2022    7:35 AM 03/03/2022    8:48 AM 03/01/2022    8:01 AM 10/27/2021    7:41 AM 09/19/2021    8:04 AM  Diabetic Labs  HbA1c 4.8 - 5.6 % 6.4   6.4  6.9    Micro/Creat Ratio 0 - 29 mg/g creat  28      Chol 100 - 199 mg/dL 503    888    HDL >28 mg/dL 49    52    Calc LDL 0 - 99 mg/dL 91    86    Triglycerides 0 - 149 mg/dL 003    491    Creatinine 0.76 - 1.27 mg/dL 7.91   5.05  6.97  9.48       12/20/2022    8:09 AM 12/20/2022    8:08 AM 09/25/2022    8:13 AM 09/21/2022    1:01 PM 08/25/2022   12:01 PM 07/26/2022    8:33 AM 07/26/2022    8:05 AM  BP/Weight  Systolic BP 122 146  135 145 148 128 130  Diastolic BP 88 87 86 80 89 80 84  Wt. (Lbs)  261.04 252 253.8 250.5  251.12  BMI  35.4 kg/m2 34.18 kg/m2 34.42 kg/m2 33.97 kg/m2  34.06 kg/m2      Latest Ref Rng & Units 08/08/2022   12:00 AM 07/22/2021    3:00 PM  Foot/eye exam completion dates  Eye Exam No Retinopathy No Retinopathy       Foot Form Completion   Done     This result is from an external source.      Updated lab needed at/ before next visit.   Essential hypertension Controlled, no change in medication DASH diet and commitment to daily physical activity for a minimum of 30 minutes discussed and encouraged, as a part of hypertension management. The importance of attaining a healthy weight is also discussed.     12/20/2022    8:09 AM 12/20/2022    8:08 AM 09/25/2022    8:13 AM 09/21/2022    1:01 PM 08/25/2022   12:01 PM 07/26/2022    8:33 AM  07/26/2022    8:05 AM  BP/Weight  Systolic BP 122 146 135 145 148 128 130  Diastolic BP 88 87 86 80 89 80 84  Wt. (Lbs)  261.04 252 253.8 250.5  251.12  BMI  35.4 kg/m2 34.18 kg/m2 34.42 kg/m2 33.97 kg/m2  34.06 kg/m2       Tobacco abuse Nicotine free since 09/2022, congratulated and encouraged to continue same  Hyperlipidemia LDL goal <100 Hyperlipidemia:Low fat diet discussed and encouraged.   Lipid Panel  Lab Results  Component Value Date   CHOL 163 07/25/2022   HDL 49 07/25/2022   LDLCALC 91 07/25/2022   TRIG 128 07/25/2022   CHOLHDL 3.3 07/25/2022     Controlled, no change in medication left   Morbid obesity 10 pound weight gain  Patient re-educated about  the importance of commitment to a  minimum of 150 minutes of exercise per week as able.  The importance of healthy food choices with portion control discussed, as well as eating regularly and within a 12 hour window most days. The need to choose "clean , green" food 50 to 75% of the time is discussed, as well as to make water the primary drink and set a goal of 64 ounces water daily.       12/20/2022    8:08 AM 09/25/2022    8:13 AM 09/21/2022    1:01 PM  Weight /BMI  Weight 261 lb 0.6 oz 252 lb 253 lb 12.8 oz  Height 6' (1.829 m) 6' (1.829 m) 6' (1.829 m)  BMI 35.4 kg/m2 34.18 kg/m2 34.42 kg/m2

## 2022-12-20 NOTE — Patient Instructions (Addendum)
F/U early September, call if you need me before  CONGRATS on smoking cessation  Please watch food choices, stick with vegetable fruit and water as your main base with baked or grilled meats.  Continue to avoid sugar and starchy foods like potatoes bread pasta rice.  You need Tdap vaccine, nurse please give prescription for him to take to the pharmacy.  Nurse please obtain and document from [facility] is recent Shingrix vaccine as well as his COVID-vaccine he had them both in the fall last year reportedly.  Fasting  lipid CMP and EGFR and HbA1c to be obtained in the next 1 week.  It is important that you exercise regularly at least 30 minutes 5 times a week. If you develop chest pain, have severe difficulty breathing, or feel very tired, stop exercising immediately and seek medical attention  Thanks for choosing [facility], we consider it a privelige to serve you.

## 2022-12-20 NOTE — Assessment & Plan Note (Signed)
Hyperlipidemia:Low fat diet discussed and encouraged.   Lipid Panel  Lab Results  Component Value Date   CHOL 163 07/25/2022   HDL 49 07/25/2022   LDLCALC 91 07/25/2022   TRIG 128 07/25/2022   CHOLHDL 3.3 07/25/2022     Controlled, no change in medication left

## 2022-12-20 NOTE — Assessment & Plan Note (Signed)
Nicotine free since 09/2022, congratulated and encouraged to continue same

## 2022-12-27 DIAGNOSIS — E1159 Type 2 diabetes mellitus with other circulatory complications: Secondary | ICD-10-CM | POA: Diagnosis not present

## 2022-12-27 DIAGNOSIS — I10 Essential (primary) hypertension: Secondary | ICD-10-CM | POA: Diagnosis not present

## 2022-12-27 DIAGNOSIS — E785 Hyperlipidemia, unspecified: Secondary | ICD-10-CM | POA: Diagnosis not present

## 2022-12-28 LAB — LIPID PANEL
Chol/HDL Ratio: 2.7 ratio (ref 0.0–5.0)
Cholesterol, Total: 144 mg/dL (ref 100–199)
HDL: 54 mg/dL (ref >39)
LDL Chol Calc (NIH): 72 mg/dL (ref 0–99)
Triglycerides: 95 mg/dL (ref 0–149)
VLDL Cholesterol Cal: 18 mg/dL (ref 5–40)

## 2022-12-28 LAB — CMP14+EGFR
ALT: 17 IU/L (ref 0–44)
AST: 17 IU/L (ref 0–40)
Albumin/Globulin Ratio: 1.8 (ref 1.2–2.2)
Albumin: 4.4 g/dL (ref 3.8–4.9)
Alkaline Phosphatase: 78 IU/L (ref 44–121)
BUN/Creatinine Ratio: 13 (ref 9–20)
BUN: 15 mg/dL (ref 6–24)
Bilirubin Total: 0.8 mg/dL (ref 0.0–1.2)
CO2: 24 mmol/L (ref 20–29)
Calcium: 9.7 mg/dL (ref 8.7–10.2)
Chloride: 100 mmol/L (ref 96–106)
Creatinine, Ser: 1.15 mg/dL (ref 0.76–1.27)
Globulin, Total: 2.4 g/dL (ref 1.5–4.5)
Glucose: 123 mg/dL — ABNORMAL HIGH (ref 70–99)
Potassium: 4.3 mmol/L (ref 3.5–5.2)
Sodium: 139 mmol/L (ref 134–144)
Total Protein: 6.8 g/dL (ref 6.0–8.5)
eGFR: 73 mL/min/{1.73_m2} (ref >59)

## 2022-12-28 LAB — HEMOGLOBIN A1C
Est. average glucose Bld gHb Est-mCnc: 148 mg/dL
Hgb A1c MFr Bld: 6.8 % — ABNORMAL HIGH (ref 4.8–5.6)

## 2023-03-23 ENCOUNTER — Inpatient Hospital Stay: Payer: Medicare HMO | Attending: Hematology

## 2023-03-23 DIAGNOSIS — D72829 Elevated white blood cell count, unspecified: Secondary | ICD-10-CM | POA: Diagnosis not present

## 2023-03-23 LAB — CBC WITH DIFFERENTIAL/PLATELET
Abs Immature Granulocytes: 0.02 10*3/uL (ref 0.00–0.07)
Basophils Absolute: 0.1 10*3/uL (ref 0.0–0.1)
Basophils Relative: 1 %
Eosinophils Absolute: 0.2 10*3/uL (ref 0.0–0.5)
Eosinophils Relative: 3 %
HCT: 41.5 % (ref 39.0–52.0)
Hemoglobin: 13.3 g/dL (ref 13.0–17.0)
Immature Granulocytes: 0 %
Lymphocytes Relative: 34 %
Lymphs Abs: 2.6 10*3/uL (ref 0.7–4.0)
MCH: 28 pg (ref 26.0–34.0)
MCHC: 32 g/dL (ref 30.0–36.0)
MCV: 87.4 fL (ref 80.0–100.0)
Monocytes Absolute: 0.6 10*3/uL (ref 0.1–1.0)
Monocytes Relative: 8 %
Neutro Abs: 4.2 10*3/uL (ref 1.7–7.7)
Neutrophils Relative %: 54 %
Platelets: 232 10*3/uL (ref 150–400)
RBC: 4.75 MIL/uL (ref 4.22–5.81)
RDW: 13.5 % (ref 11.5–15.5)
WBC: 7.6 10*3/uL (ref 4.0–10.5)
nRBC: 0 % (ref 0.0–0.2)

## 2023-05-21 DIAGNOSIS — E1159 Type 2 diabetes mellitus with other circulatory complications: Secondary | ICD-10-CM | POA: Diagnosis not present

## 2023-05-22 ENCOUNTER — Encounter: Payer: Self-pay | Admitting: Family Medicine

## 2023-05-22 ENCOUNTER — Ambulatory Visit (INDEPENDENT_AMBULATORY_CARE_PROVIDER_SITE_OTHER): Payer: Medicare HMO | Admitting: Family Medicine

## 2023-05-22 VITALS — BP 128/82 | HR 88 | Ht 72.0 in | Wt 264.1 lb

## 2023-05-22 DIAGNOSIS — Z23 Encounter for immunization: Secondary | ICD-10-CM | POA: Diagnosis not present

## 2023-05-22 DIAGNOSIS — Z125 Encounter for screening for malignant neoplasm of prostate: Secondary | ICD-10-CM

## 2023-05-22 DIAGNOSIS — Z122 Encounter for screening for malignant neoplasm of respiratory organs: Secondary | ICD-10-CM

## 2023-05-22 DIAGNOSIS — Z87891 Personal history of nicotine dependence: Secondary | ICD-10-CM | POA: Diagnosis not present

## 2023-05-22 DIAGNOSIS — I10 Essential (primary) hypertension: Secondary | ICD-10-CM | POA: Diagnosis not present

## 2023-05-22 DIAGNOSIS — E559 Vitamin D deficiency, unspecified: Secondary | ICD-10-CM | POA: Diagnosis not present

## 2023-05-22 DIAGNOSIS — E785 Hyperlipidemia, unspecified: Secondary | ICD-10-CM

## 2023-05-22 DIAGNOSIS — E1159 Type 2 diabetes mellitus with other circulatory complications: Secondary | ICD-10-CM

## 2023-05-22 DIAGNOSIS — E118 Type 2 diabetes mellitus with unspecified complications: Secondary | ICD-10-CM

## 2023-05-22 NOTE — Assessment & Plan Note (Signed)
Derteiorated , no med change, change in diet Mr. Marczak is reminded of the importance of commitment to daily physical activity for 30 minutes or more, as able and the need to limit carbohydrate intake to 30 to 60 grams per meal to help with blood sugar control.   The need to take medication as prescribed, test blood sugar as directed, and to call between visits if there is a concern that blood sugar is uncontrolled is also discussed.   Mr. Kutzner is reminded of the importance of daily foot exam, annual eye examination, and good blood sugar, blood pressure and cholesterol control.     Latest Ref Rng & Units 05/21/2023    8:02 AM 12/27/2022    8:08 AM 07/25/2022    7:35 AM 03/03/2022    8:48 AM 03/01/2022    8:01 AM  Diabetic Labs  HbA1c 4.8 - 5.6 % 7.7  6.8  6.4   6.4   Micro/Creat Ratio 0 - 29 mg/g creat    28    Chol 100 - 199 mg/dL  161  096     HDL >04 mg/dL  54  49     Calc LDL 0 - 99 mg/dL  72  91     Triglycerides 0 - 149 mg/dL  95  540     Creatinine 0.76 - 1.27 mg/dL 9.81  1.91  4.78   2.95       05/22/2023    9:00 AM 05/22/2023    8:16 AM 12/20/2022    8:09 AM 12/20/2022    8:08 AM 09/25/2022    8:13 AM 09/21/2022    1:01 PM 08/25/2022   12:01 PM  BP/Weight  Systolic BP 128 132 122 146 135 145 148  Diastolic BP 82 88 88 87 86 80 89  Wt. (Lbs)  264.08  261.04 252 253.8 250.5  BMI  35.82 kg/m2  35.4 kg/m2 34.18 kg/m2 34.42 kg/m2 33.97 kg/m2      Latest Ref Rng & Units 08/08/2022   12:00 AM 07/22/2021    3:00 PM  Foot/eye exam completion dates  Eye Exam No Retinopathy No Retinopathy       Foot Form Completion   Done     This result is from an external source.

## 2023-05-22 NOTE — Patient Instructions (Addendum)
Annual exam mid December or early January, call if you need me sooner Please schedule retinal screen at checkout  Flu vaccine today  Please refer for chest scan Fasting CBc, lipid, cmp and eGFr, hBa1C, TSH, PSA and vit D 1 week before next appointment  Please get covid vaccine at pharmacy  Nurse please enter shingrix and TdaP vaccines, states he has had them   Urine ACR today  Blood sugar has increased and is uncontrolled, need to stop cake  and bread and sodas  Reduce salt for better blood pressure   Start testing morning  blood sugar before you eat, goal is 80 to 120  Thanks for choosing [facility], we consider it a privelige to serve you.

## 2023-05-22 NOTE — Assessment & Plan Note (Signed)
Hyperlipidemia:Low fat diet discussed and encouraged.   Lipid Panel  Lab Results  Component Value Date   CHOL 144 12/27/2022   HDL 54 12/27/2022   LDLCALC 72 12/27/2022   TRIG 95 12/27/2022   CHOLHDL 2.7 12/27/2022     Controlled, no change in medication

## 2023-05-22 NOTE — Assessment & Plan Note (Signed)
  Patient re-educated about  the importance of commitment to a  minimum of 150 minutes of exercise per week as able.  The importance of healthy food choices with portion control discussed, as well as eating regularly and within a 12 hour window most days. The need to choose "clean , green" food 50 to 75% of the time is discussed, as well as to make water the primary drink and set a goal of 64 ounces water daily.       05/22/2023    8:16 AM 12/20/2022    8:08 AM 09/25/2022    8:13 AM  Weight /BMI  Weight 264 lb 1.3 oz 261 lb 0.6 oz 252 lb  Height 6' (1.829 m) 6' (1.829 m) 6' (1.829 m)  BMI 35.82 kg/m2 35.4 kg/m2 34.18 kg/m2

## 2023-05-22 NOTE — Progress Notes (Signed)
Dakota Mendez     MRN: [id number]      DOB: December 10, 1960  Chief Complaint  Patient presents with   Hypertension    F/u pt. Would like to schedule for eye exam.    Immunizations    Pt. Has gotten shingles and tdap at pharmacy, has had blood work for diabetic eval. Needs flu today.     HPI Dakota Mendez is here for follow up and re-evaluation of chronic medical conditions, medication management and review of any available recent lab and radiology data.  Preventive health is updated, specifically  Cancer screening and Immunization.   Questions or concerns regarding consultations or procedures which the PT has had in the interim are  addressed. The PT denies any adverse reactions to current medications since the last visit.  Has been eating and drinking an excessive amount of sugar  ROS Denies recent fever or chills. Denies sinus pressure, nasal congestion, ear pain or sore throat. Denies chest congestion, productive cough or wheezing. Denies chest pains, palpitations and leg swelling Denies abdominal pain, nausea, vomiting,diarrhea or constipation.   Denies dysuria, frequency, hesitancy or incontinence. Denies joint pain, swelling and limitation in mobility. Denies headaches, seizures, numbness, or tingling. Denies depression, anxiety or insomnia. Denies skin break down or rash.   PE  BP 128/82   Pulse 88   Ht 6' (1.829 m)   Wt 264 lb 1.3 oz (119.8 kg)   SpO2 95%   BMI 35.82 kg/m   Patient alert and oriented and in no cardiopulmonary distress.  HEENT: No facial asymmetry, EOMI,     Neck supple .  Chest: Clear to auscultation bilaterally.  CVS: S1, S2 no murmurs, no S3.Regular rate.  ABD: Soft non tender.   Ext: No edema  MS: Adequate ROM spine, shoulders, hips and knees.  Skin: Intact, no ulcerations or rash noted.  Psych: Good eye contact, normal affect. Memory intact not anxious or depressed appearing.  CNS: CN 2-12 intact, power,  normal throughout.no focal  deficits noted.   Assessment & Plan  Essential hypertension Controlled, no change in medication DASH diet and commitment to daily physical activity for a minimum of 30 minutes discussed and encouraged, as a part of hypertension management. The importance of attaining a healthy weight is also discussed.     05/22/2023    9:00 AM 05/22/2023    8:16 AM 12/20/2022    8:09 AM 12/20/2022    8:08 AM 09/25/2022    8:13 AM 09/21/2022    1:01 PM 08/25/2022   12:01 PM  BP/Weight  Systolic BP 128 132 122 146 135 145 148  Diastolic BP 82 88 88 87 86 80 89  Wt. (Lbs)  264.08  261.04 252 253.8 250.5  BMI  35.82 kg/m2  35.4 kg/m2 34.18 kg/m2 34.42 kg/m2 33.97 kg/m2       Morbid obesity (HCC)  Patient re-educated about  the importance of commitment to a  minimum of 150 minutes of exercise per week as able.  The importance of healthy food choices with portion control discussed, as well as eating regularly and within a 12 hour window most days. The need to choose "clean , green" food 50 to 75% of the time is discussed, as well as to make water the primary drink and set a goal of 64 ounces water daily.       05/22/2023    8:16 AM 12/20/2022    8:08 AM 09/25/2022    8:13 AM  Weight /BMI  Weight 264 lb 1.3 oz 261 lb 0.6 oz 252 lb  Height 6' (1.829 m) 6' (1.829 m) 6' (1.829 m)  BMI 35.82 kg/m2 35.4 kg/m2 34.18 kg/m2      Hyperlipidemia LDL goal <100 Hyperlipidemia:Low fat diet discussed and encouraged.   Lipid Panel  Lab Results  Component Value Date   CHOL 144 12/27/2022   HDL 54 12/27/2022   LDLCALC 72 12/27/2022   TRIG 95 12/27/2022   CHOLHDL 2.7 12/27/2022     Controlled, no change in medication   Type 2 diabetes mellitus with vascular disease (HCC) Derteiorated , no med change, change in diet Dakota Mendez is reminded of the importance of commitment to daily physical activity for 30 minutes or more, as able and the need to limit carbohydrate intake to 30 to 60 grams per meal  to help with blood sugar control.   The need to take medication as prescribed, test blood sugar as directed, and to call between visits if there is a concern that blood sugar is uncontrolled is also discussed.   Dakota Mendez is reminded of the importance of daily foot exam, annual eye examination, and good blood sugar, blood pressure and cholesterol control.     Latest Ref Rng & Units 05/21/2023    8:02 AM 12/27/2022    8:08 AM 07/25/2022    7:35 AM 03/03/2022    8:48 AM 03/01/2022    8:01 AM  Diabetic Labs  HbA1c 4.8 - 5.6 % 7.7  6.8  6.4   6.4   Micro/Creat Ratio 0 - 29 mg/g creat    28    Chol 100 - 199 mg/dL  161  096     HDL >04 mg/dL  54  49     Calc LDL 0 - 99 mg/dL  72  91     Triglycerides 0 - 149 mg/dL  95  540     Creatinine 0.76 - 1.27 mg/dL 9.81  1.91  4.78   2.95       05/22/2023    9:00 AM 05/22/2023    8:16 AM 12/20/2022    8:09 AM 12/20/2022    8:08 AM 09/25/2022    8:13 AM 09/21/2022    1:01 PM 08/25/2022   12:01 PM  BP/Weight  Systolic BP 128 132 122 146 135 145 148  Diastolic BP 82 88 88 87 86 80 89  Wt. (Lbs)  264.08  261.04 252 253.8 250.5  BMI  35.82 kg/m2  35.4 kg/m2 34.18 kg/m2 34.42 kg/m2 33.97 kg/m2      Latest Ref Rng & Units 08/08/2022   12:00 AM 07/22/2021    3:00 PM  Foot/eye exam completion dates  Eye Exam No Retinopathy No Retinopathy       Foot Form Completion   Done     This result is from an external source.

## 2023-05-22 NOTE — Assessment & Plan Note (Signed)
Controlled, no change in medication DASH diet and commitment to daily physical activity for a minimum of 30 minutes discussed and encouraged, as a part of hypertension management. The importance of attaining a healthy weight is also discussed.     05/22/2023    9:00 AM 05/22/2023    8:16 AM 12/20/2022    8:09 AM 12/20/2022    8:08 AM 09/25/2022    8:13 AM 09/21/2022    1:01 PM 08/25/2022   12:01 PM  BP/Weight  Systolic BP 128 132 122 146 135 145 148  Diastolic BP 82 88 88 87 86 80 89  Wt. (Lbs)  264.08  261.04 252 253.8 250.5  BMI  35.82 kg/m2  35.4 kg/m2 34.18 kg/m2 34.42 kg/m2 33.97 kg/m2

## 2023-05-23 ENCOUNTER — Ambulatory Visit (INDEPENDENT_AMBULATORY_CARE_PROVIDER_SITE_OTHER): Payer: Medicare HMO

## 2023-05-23 DIAGNOSIS — E1159 Type 2 diabetes mellitus with other circulatory complications: Secondary | ICD-10-CM | POA: Diagnosis not present

## 2023-05-23 DIAGNOSIS — E118 Type 2 diabetes mellitus with unspecified complications: Secondary | ICD-10-CM

## 2023-05-23 LAB — MICROALBUMIN / CREATININE URINE RATIO
Creatinine, Urine: 114.7 mg/dL
Microalb/Creat Ratio: 18 mg/g{creat} (ref 0–29)
Microalbumin, Urine: 21 ug/mL

## 2023-05-23 LAB — HM DIABETES EYE EXAM

## 2023-05-23 NOTE — Progress Notes (Signed)
Dakota Mendez Large arrived 05/23/2023 and has given verbal consent to obtain images and complete their overdue diabetic retinal screening.  The images have been sent to an ophthalmologist or optometrist for review and interpretation.  Results will be sent back to Kerri Perches, MD for review.  Patient has been informed they will be contacted when we receive the results via telephone or MyChart

## 2023-06-05 ENCOUNTER — Telehealth: Payer: Self-pay | Admitting: Family Medicine

## 2023-06-05 NOTE — Telephone Encounter (Signed)
[place] called from [facility] in [place] they are not accepting any new patients medical with diabetes.

## 2023-06-06 ENCOUNTER — Ambulatory Visit: Payer: Medicare HMO

## 2023-06-06 VITALS — Ht 72.0 in | Wt 261.0 lb

## 2023-06-06 DIAGNOSIS — Z0000 Encounter for general adult medical examination without abnormal findings: Secondary | ICD-10-CM

## 2023-06-06 NOTE — Progress Notes (Addendum)
Subjective:   Dakota Mendez is a 62 y.o. male who presents for Medicare Annual/Subsequent preventive examination.  Visit Complete: Virtual  I connected with  Dakota Mendez on 06/06/23 by a audio enabled telemedicine application and verified that I am speaking with the correct person using two identifiers.  Patient Location: Home  Provider Location: Home Office  I discussed the limitations of evaluation and management by telemedicine. The patient expressed understanding and agreed to proceed.  Patient Medicare AWV questionnaire was completed by the patient on 06/06/2023; I have confirmed that all information answered by patient is correct and no changes since this date.  Because this visit was a virtual/telehealth visit, some criteria may be missing or patient reported. Any vitals not documented were not able to be obtained and vitals that have been documented are patient reported.  Cardiac Risk Factors include: advanced age (>28men, >71 women);diabetes mellitus;dyslipidemia;male gender;hypertension     Objective:    Today's Vitals   06/06/23 0808  Weight: 261 lb (118.4 kg)  Height: 6' (1.829 m)   Body mass index is 35.4 kg/m.     06/06/2023    8:12 AM 09/25/2022    8:25 AM 08/25/2022   12:00 PM 04/10/2022    8:21 AM 04/05/2021    1:01 PM 03/27/2018    4:41 PM 08/08/2017    8:43 AM  Advanced Directives  Does Patient Have a Medical Advance Directive? Yes Yes Yes No Yes Yes No  Type of Estate agent of [place];Living will Living will;Healthcare Power of Attorney Living will;Healthcare Power of [employer] Power of [employer] Power of [employer];Living will   Does patient want to make changes to medical advance directive?  No - Patient declined No - Patient declined  No - Patient declined    Copy of Healthcare Power of Attorney in Chart? No - copy requested  No - copy requested  No - copy requested No - copy requested   Would patient like  information on creating a medical advance directive?    No - Patient declined   No - Patient declined    Current Medications (verified) Outpatient Encounter Medications as of 06/06/2023  Medication Sig   Alcohol Swabs PADS 1 each by Does not apply route as directed.   amLODipine (NORVASC) 10 MG tablet TAKE 1 TABLET EVERY DAY   Blood Glucose Monitoring Suppl (TRUE METRIX METER) w/Device KIT USE AS DIRECTED   cloNIDine (CATAPRES) 0.3 MG tablet Take 1 tablet (0.3 mg total) by mouth daily.   IBU 800 MG tablet Take 800 mg by mouth 2 (two) times daily.   losartan-hydrochlorothiazide (HYZAAR) 100-25 MG tablet TAKE 1 TABLET EVERY DAY   metFORMIN (GLUCOPHAGE) 1000 MG tablet TAKE 1 TABLET TWICE DAILY WITH MEALS   potassium chloride (KLOR-CON M) 10 MEQ tablet TAKE 1 TABLET EVERY DAY   rosuvastatin (CRESTOR) 10 MG tablet Take 1 tablet (10 mg total) by mouth daily.   TRUE METRIX BLOOD GLUCOSE TEST test strip TEST BLOOD SUGAR  UP  TO FOUR TIMES DAILY AS DIRECTED   TRUEplus Lancets 33G MISC 1 each by Does not apply route as directed.   UNABLE TO FIND Med Name: TDAP VACCINE   clotrimazole-betamethasone (LOTRISONE) cream Apply twice daily, as needed, to affected aresa (Patient not taking: Reported on 06/06/2023)   No facility-administered encounter medications on file as of 06/06/2023.    Allergies (verified) Jardiance [empagliflozin]   History: Past Medical History:  Diagnosis Date   Chronic back  pain    Chronic neck pain    Diabetes mellitus    Hyperlipidemia    Hypertension    Obesity    Sleep apnea    Not compliant with CPAP   Past Surgical History:  Procedure Laterality Date   CERVICAL SPINE SURGERY     COLONOSCOPY N/A 08/08/2017   Procedure: COLONOSCOPY;  Surgeon: Malissa Hippo, MD;  [facility];  Service: Endoscopy;  Laterality: N/A;  930   SPINE SURGERY  09/21/2011   cervial spine   Family History  Problem Relation Age of Onset   Cirrhosis Father    Heart attack  Mother    Depression Mother    Stroke Mother    Hearing loss Brother    Cirrhosis Brother    Diabetes Sister    Sleep apnea Son    Hypertension Son    Social History   Socioeconomic History   Marital status: Married    Spouse name: Not on file   Number of children: 4   Years of education: Not on file   Highest education level: 9th grade  Occupational History   Occupation: disabled-truck dirver  Tobacco Use   Smoking status: Former    Current packs/day: 0.00    Average packs/day: 0.5 packs/day for 40.0 years (20.0 ttl pk-yrs)    Types: Cigarettes    Start date: 09/11/1982    Quit date: 09/11/2022    Years since quitting: 0.7   Smokeless tobacco: Never  Vaping Use   Vaping status: Never Used  Substance and Sexual Activity   Alcohol use: No   Drug use: No   Sexual activity: Yes  Other Topics Concern   Not on file  Social History Narrative   Not on file   Social Determinants of Health   Financial Resource Strain: Low Risk  (06/06/2023)   Overall Financial Resource Strain (CARDIA)    Difficulty of Paying Living Expenses: Not hard at all  Food Insecurity: No Food Insecurity (06/06/2023)   Hunger Vital Sign    Worried About Running Out of Food in the Last Year: Never true    Ran Out of Food in the Last Year: Never true  Transportation Needs: No Transportation Needs (06/06/2023)   PRAPARE - Administrator, Civil Service (Medical): No    Lack of Transportation (Non-Medical): No  Physical Activity: Sufficiently Active (06/06/2023)   Exercise Vital Sign    Days of Exercise per Week: 5 days    Minutes of Exercise per Session: 30 min  Stress: No Stress Concern Present (06/06/2023)   [employer] of Occupational Health - Occupational Stress Questionnaire    Feeling of Stress : Not at all  Social Connections: Socially Integrated (06/06/2023)   Social Connection and Isolation Panel [NHANES]    Frequency of Communication with Friends and Family: More than three times  a week    Frequency of Social Gatherings with Friends and Family: More than three times a week    Attends Religious Services: More than 4 times per year    Active Member of [employer] or Organizations: Yes    Attends Engineer, structural: More than 4 times per year    Marital Status: Married    Tobacco Counseling Counseling given: Not Answered   Clinical Intake:  Pre-visit preparation completed: Yes  Pain : No/denies pain     Nutritional Risks: None Diabetes: No  How often do you need to have someone help you when you read instructions,  pamphlets, or other written materials from your doctor or pharmacy?: 1 - Never  Interpreter Needed?: No  Information entered by :: Renie Ora, LPN   Activities of Daily Living    06/06/2023    8:12 AM  In your present state of health, do you have any difficulty performing the following activities:  Hearing? 0  Vision? 0  Difficulty concentrating or making decisions? 0  Walking or climbing stairs? 0  Dressing or bathing? 0  Doing errands, shopping? 0  Preparing Food and eating ? N  Using the Toilet? N  In the past six months, have you accidently leaked urine? N  Do you have problems with loss of bowel control? N  Managing your Medications? N  Managing your Finances? N  Housekeeping or managing your Housekeeping? N    Patient Care Team: Kerri Perches, MD as PCP - General Diona Browner Illene Bolus, MD as PCP - Cardiology (Cardiology) Gavin Pound, [facility] (Inactive) (Pharmacist)  Indicate any recent Medical Services you may have received from other than [facility] providers in the past year (date may be approximate).     Assessment:   This is a routine wellness examination for Dakota Mendez.  Hearing/Vision screen Vision Screening - Comments:: Wears rx glasses - up to date with routine eye exams with  [facility]   Goals Addressed             This Visit's Progress    Exercise 3x per week (30 min per time)   On track    Recommend  starting a routine exercise program at least 3 days a week for 30-45 minutes at a time as tolerated.         Depression Screen    06/06/2023    8:11 AM 05/22/2023    8:22 AM 12/20/2022    8:09 AM 08/25/2022   12:03 PM 08/25/2022   12:02 PM 07/26/2022    8:06 AM 04/10/2022    8:22 AM  PHQ 2/9 Scores  PHQ - 2 Score 0 0 0 0 0 0 0  PHQ- 9 Score 0 0         Fall Risk    06/06/2023    8:09 AM 05/22/2023    8:21 AM 12/20/2022    8:09 AM 07/26/2022    8:06 AM 04/10/2022    8:21 AM  Fall Risk   Falls in the past year? 0 0 0 0 0  Number falls in past yr: 0  0 0 0  Injury with Fall? 0  0 0 0  Risk for fall due to : No Fall Risks  No Fall Risks No Fall Risks No Fall Risks  Follow up Falls prevention discussed  Falls evaluation completed Falls evaluation completed Falls evaluation completed    MEDICARE RISK AT HOME: Medicare Risk at Home Any stairs in or around the home?: Yes If so, are there any without handrails?: No Home free of loose throw rugs in walkways, pet beds, electrical cords, etc?: Yes Adequate lighting in your home to reduce risk of falls?: Yes Life alert?: No Use of a cane, walker or w/c?: No Grab bars in the bathroom?: Yes Shower chair or bench in shower?: Yes Elevated toilet seat or a handicapped toilet?: Yes  TIMED UP AND GO:  Was the test performed?  No    Cognitive Function:    04/05/2021    1:03 PM  MMSE - Mini Mental State Exam  Not completed: Unable to complete  06/06/2023    8:13 AM 04/10/2022    8:22 AM 04/05/2021    1:03 PM 04/01/2020    1:17 PM 04/01/2019    9:53 AM  6CIT Screen  What Year? 0 points 0 points 0 points 0 points 0 points  What month? 0 points 0 points 0 points 0 points 0 points  What time? 0 points 0 points 0 points 0 points 0 points  Count back from 20 0 points 0 points 0 points 0 points 0 points  Months in reverse 0 points 0 points 0 points 0 points 0 points  Repeat phrase 0 points 0 points 0 points 0 points 0 points   Total Score 0 points 0 points 0 points 0 points 0 points    Immunizations Immunization History  Administered Date(s) Administered   Influenza Whole 10/11/2007, 05/17/2011   Influenza, Seasonal, Injecte, Preservative Fre 05/22/2023   Influenza,inj,Quad PF,6+ Mos 06/13/2013, 06/18/2014, 07/22/2015, 06/01/2016, 07/25/2017, 05/30/2018, 06/09/2019, 04/29/2020, 06/13/2021, 07/05/2022   Moderna Covid-19 Vaccine Bivalent Booster 84yrs & up 04/27/2022   Moderna Sars-Covid-2 Vaccination 12/05/2019, 01/06/2020, 07/26/2020, 01/28/2021   Pneumococcal Conjugate-13 12/15/2014   Pneumococcal Polysaccharide-23 04/05/2011, 06/01/2016   Td 09/21/2006   Zoster Recombinant(Shingrix) 04/27/2022    TDAP status: Up to date  Flu Vaccine status: Up to date  Pneumococcal vaccine status: Up to date  Covid-19 vaccine status: Completed vaccines  Qualifies for Shingles Vaccine? Yes   Zostavax completed Yes   Shingrix Completed?: Yes  Screening Tests Health Maintenance  Topic Date Due   DTaP/Tdap/Td (2 - Tdap) 09/21/2016   Zoster Vaccines- Shingrix (2 of 2) 06/22/2022   COVID-19 Vaccine (6 - 2023-24 season) 05/13/2023   FOOT EXAM  07/27/2023   Lung Cancer Screening  07/28/2023   OPHTHALMOLOGY EXAM  08/09/2023   HEMOGLOBIN A1C  11/18/2023   Diabetic kidney evaluation - eGFR measurement  05/20/2024   Diabetic kidney evaluation - Urine ACR  05/21/2024   Medicare Annual Wellness (AWV)  06/05/2024   Colonoscopy  08/09/2027   INFLUENZA VACCINE  Completed   Hepatitis C Screening  Completed   HIV Screening  Completed   HPV VACCINES  Aged Out    Health Maintenance  Health Maintenance Due  Topic Date Due   DTaP/Tdap/Td (2 - Tdap) 09/21/2016   Zoster Vaccines- Shingrix (2 of 2) 06/22/2022   COVID-19 Vaccine (6 - 2023-24 season) 05/13/2023    Colorectal cancer screening: Type of screening: Colonoscopy. Completed 08/08/2017. Repeat every 10 years  Lung Cancer Screening: (Low Dose CT Chest  recommended if Age 49-80 years, 20 pack-year currently smoking OR have quit w/in 15years.) does qualify.   Lung Cancer Screening Referral: 05/22/2023  Additional Screening:  Hepatitis C Screening: does not qualify; Completed 11/27/2015  Vision Screening: Recommended annual ophthalmology exams for early detection of glaucoma and other disorders of the eye. Is the patient up to date with their annual eye exam?  No  Who is the provider or what is the name of the office in which the patient attends annual eye exams? [facility] If pt is not established with a provider, would they like to be referred to a provider to establish care? No .   Dental Screening: Recommended annual dental exams for proper oral hygiene   Community Resource Referral / Chronic Care Management: CRR required this visit?  No   CCM required this visit?  No     Plan:     I have personally reviewed and noted the following in the patient's chart:  Medical and social history Use of alcohol, tobacco or illicit drugs  Current medications and supplements including opioid prescriptions. Patient is not currently taking opioid prescriptions. Functional ability and status Nutritional status Physical activity Advanced directives List of other physicians Hospitalizations, surgeries, and ER visits in previous 12 months Vitals Screenings to include cognitive, depression, and falls Referrals and appointments  In addition, I have reviewed and discussed with patient certain preventive protocols, quality metrics, and best practice recommendations. A written personalized care plan for preventive services as well as general preventive health recommendations were provided to patient.     Lorrene Reid, LPN   [id number]   After Visit Summary: (MyChart) Due to this being a telephonic visit, the after visit summary with patients personalized plan was offered to patient via MyChart   Nurse Notes: none

## 2023-06-06 NOTE — Patient Instructions (Signed)
Dakota Mendez , Thank you for taking time to come for your Medicare Wellness Visit. I appreciate your ongoing commitment to your health goals. Please review the following plan we discussed and let me know if I can assist you in the future.   Referrals/Orders/Follow-Ups/Clinician Recommendations: Aim for 30 minutes of exercise or brisk walking, 6-8 glasses of water, and 5 servings of fruits and vegetables each day.   This is a list of the screening recommended for you and due dates:  Health Maintenance  Topic Date Due   DTaP/Tdap/Td vaccine (2 - Tdap) 09/21/2016   Zoster (Shingles) Vaccine (2 of 2) 06/22/2022   COVID-19 Vaccine (6 - 2023-24 season) 05/13/2023   Complete foot exam   07/27/2023   Screening for Lung Cancer  07/28/2023   Eye exam for diabetics  08/09/2023   Hemoglobin A1C  11/18/2023   Yearly kidney function blood test for diabetes  05/20/2024   Yearly kidney health urinalysis for diabetes  05/21/2024   Medicare Annual Wellness Visit  06/05/2024   Colon Cancer Screening  08/09/2027   Flu Shot  Completed   Hepatitis C Screening  Completed   HIV Screening  Completed   HPV Vaccine  Aged Out    Advanced directives: (Copy Requested) Please bring a copy of your health care power of attorney and living will to the office to be added to your chart at your convenience.  Next Medicare Annual Wellness Visit scheduled for next year: Yes Insert preventive care Attachment reference

## 2023-06-11 ENCOUNTER — Other Ambulatory Visit: Payer: Self-pay

## 2023-06-11 DIAGNOSIS — E1159 Type 2 diabetes mellitus with other circulatory complications: Secondary | ICD-10-CM

## 2023-06-11 NOTE — Telephone Encounter (Signed)
Referral sent to [facility]

## 2023-07-20 ENCOUNTER — Encounter: Payer: Self-pay | Admitting: Family Medicine

## 2023-07-20 ENCOUNTER — Ambulatory Visit (INDEPENDENT_AMBULATORY_CARE_PROVIDER_SITE_OTHER): Payer: Medicare HMO | Admitting: Family Medicine

## 2023-07-20 VITALS — BP 127/86 | HR 92 | Ht 72.0 in | Wt 271.0 lb

## 2023-07-20 DIAGNOSIS — M79661 Pain in right lower leg: Secondary | ICD-10-CM | POA: Diagnosis not present

## 2023-07-20 DIAGNOSIS — T148XXA Other injury of unspecified body region, initial encounter: Secondary | ICD-10-CM | POA: Insufficient documentation

## 2023-07-20 NOTE — Progress Notes (Addendum)
Acute Office Visit  Subjective:    Patient ID: Dakota Mendez, male    DOB: [birth year], 62 y.o.   MRN: [id number]  Chief Complaint  Patient presents with   Leg Pain    Pt reports right leg pain since Saturday (07/14/23)    HPI The patient is in today with reports of pain in the back of his leg from walking. The patient reports that his symptoms have subsided since. He reports walking about 3 miles at least 3 days weekly. He admits that he does not stretch before walking. No swelling, warmth, fever, or chills reported  Past Medical History:  Diagnosis Date   Chronic back pain    Chronic neck pain    Diabetes mellitus    Hyperlipidemia    Hypertension    Obesity    Sleep apnea    Not compliant with CPAP    Past Surgical History:  Procedure Laterality Date   CERVICAL SPINE SURGERY     COLONOSCOPY N/A 08/08/2017   Procedure: COLONOSCOPY;  Surgeon: Malissa Hippo, MD;  [facility];  Service: Endoscopy;  Laterality: N/A;  930   SPINE SURGERY  09/21/2011   cervial spine    Family History  Problem Relation Age of Onset   Cirrhosis Father    Heart attack Mother    Depression Mother    Stroke Mother    Hearing loss Brother    Cirrhosis Brother    Diabetes Sister    Sleep apnea Son    Hypertension Son     Social History   Socioeconomic History   Marital status: Married    Spouse name: Not on file   Number of children: 4   Years of education: Not on file   Highest education level: 9th grade  Occupational History   Occupation: disabled-truck dirver  Tobacco Use   Smoking status: Former    Current packs/day: 0.00    Average packs/day: 0.5 packs/day for 40.0 years (20.0 ttl pk-yrs)    Types: Cigarettes    Start date: 09/11/1982    Quit date: 09/11/2022    Years since quitting: 0.9   Smokeless tobacco: Never  Vaping Use   Vaping status: Never Used  Substance and Sexual Activity   Alcohol use: No   Drug use: No   Sexual activity: Yes  Other Topics  Concern   Not on file  Social History Narrative   Not on file   Social Determinants of Health   Financial Resource Strain: Low Risk  (06/06/2023)   Overall Financial Resource Strain (CARDIA)    Difficulty of Paying Living Expenses: Not hard at all  Food Insecurity: No Food Insecurity (06/06/2023)   Hunger Vital Sign    Worried About Running Out of Food in the Last Year: Never true    Ran Out of Food in the Last Year: Never true  Transportation Needs: No Transportation Needs (06/06/2023)   PRAPARE - Administrator, Civil Service (Medical): No    Lack of Transportation (Non-Medical): No  Physical Activity: Sufficiently Active (06/06/2023)   Exercise Vital Sign    Days of Exercise per Week: 5 days    Minutes of Exercise per Session: 30 min  Stress: No Stress Concern Present (06/06/2023)   [employer] of Occupational Health - Occupational Stress Questionnaire    Feeling of Stress : Not at all  Social Connections: Socially Integrated (06/06/2023)   Social Connection and Isolation Panel [NHANES]    Frequency  of Communication with Friends and Family: More than three times a week    Frequency of Social Gatherings with Friends and Family: More than three times a week    Attends Religious Services: More than 4 times per year    Active Member of [employer] or Organizations: Yes    Attends Engineer, structural: More than 4 times per year    Marital Status: Married  Catering manager Violence: Not At Risk (06/06/2023)   Humiliation, Afraid, Rape, and Kick questionnaire    Fear of Current or Ex-Partner: No    Emotionally Abused: No    Physically Abused: No    Sexually Abused: No    Outpatient Medications Prior to Visit  Medication Sig Dispense Refill   Alcohol Swabs PADS 1 each by Does not apply route as directed. 100 each 0   amLODipine (NORVASC) 10 MG tablet TAKE 1 TABLET EVERY DAY 90 tablet 3   Blood Glucose Monitoring Suppl (TRUE METRIX METER) w/Device KIT USE AS  DIRECTED 1 kit 0   cloNIDine (CATAPRES) 0.3 MG tablet Take 1 tablet (0.3 mg total) by mouth daily. 90 tablet 3   clotrimazole-betamethasone (LOTRISONE) cream Apply twice daily, as needed, to affected aresa 45 g 1   IBU 800 MG tablet Take 800 mg by mouth 2 (two) times daily.     losartan-hydrochlorothiazide (HYZAAR) 100-25 MG tablet TAKE 1 TABLET EVERY DAY 90 tablet 10   metFORMIN (GLUCOPHAGE) 1000 MG tablet TAKE 1 TABLET TWICE DAILY WITH MEALS 180 tablet 10   potassium chloride (KLOR-CON M) 10 MEQ tablet TAKE 1 TABLET EVERY DAY 90 tablet 3   rosuvastatin (CRESTOR) 10 MG tablet Take 1 tablet (10 mg total) by mouth daily. 90 tablet 3   TRUE METRIX BLOOD GLUCOSE TEST test strip TEST BLOOD SUGAR  UP  TO FOUR TIMES DAILY AS DIRECTED 100 strip 0   TRUEplus Lancets 33G MISC 1 each by Does not apply route as directed. 100 each 1   UNABLE TO FIND Med Name: TDAP VACCINE 1 each 0   No facility-administered medications prior to visit.    Allergies  Allergen Reactions   Jardiance [Empagliflozin] Other (See Comments)    Redness and irritation of genitals    Review of Systems  Constitutional:  Negative for fatigue and fever.  Eyes:  Negative for visual disturbance.  Respiratory:  Negative for chest tightness and shortness of breath.   Cardiovascular:  Negative for chest pain and palpitations.  Musculoskeletal:        Right leg pain  Neurological:  Negative for dizziness and headaches.       Objective:    Physical Exam HENT:     Head: Normocephalic.     Right Ear: External ear normal.     Left Ear: External ear normal.     Nose: No congestion or rhinorrhea.     Mouth/Throat:     Mouth: Mucous membranes are moist.  Cardiovascular:     Rate and Rhythm: Regular rhythm.     Heart sounds: No murmur heard. Pulmonary:     Effort: No respiratory distress.     Breath sounds: Normal breath sounds.  Musculoskeletal:     Right lower leg: No swelling, deformity or lacerations. No edema.     Left  lower leg: No swelling, deformity or lacerations. No edema.  Neurological:     Mental Status: He is alert.     BP 127/86   Pulse 92   Ht 6' (1.829 m)  Wt 271 lb 0.6 oz (122.9 kg)   SpO2 96%   BMI 36.76 kg/m  Wt Readings from Last 3 Encounters:  07/20/23 271 lb 0.6 oz (122.9 kg)  06/06/23 261 lb (118.4 kg)  05/22/23 264 lb 1.3 oz (119.8 kg)       Assessment & Plan:  Muscle strain Assessment & Plan: For muscle strain, the following nonpharmacological interventions can help reduce pain, promote healing, and prevent further injury:  RICE Method: Rest: Avoid using the strained muscle to prevent further injury and allow time for healing. Ice: Apply ice packs to the affected area for 15-20 minutes every 1-2 hours during the first 48 hours to reduce inflammation and numb pain. Compression: Use an elastic bandage to provide gentle compression, helping to reduce swelling. Elevation: Elevate the injured area above heart level when possible to minimize swelling. -Gentle Stretching and Range-of-Motion Exercises:After the acute phase (usually 48-72 hours), gentle stretching can improve flexibility and prevent stiffness. Avoid any movements that cause pain. -Heat Therapy (after initial swelling has reduced):Applying heat can help relax muscles, improve circulation, and relieve stiffness. Use a warm compress or heating pad for 15-20 minutes at a time. -Massage:Gentle massage can help relieve tension in the surrounding muscles and improve blood flow, which may speed healing. -Strengthening Exercises:Once pain has subsided, gradual strengthening exercises can help prevent future strains. Work with a physical therapist if necessary for a safe progression. -Adequate Hydration:Staying well-hydrated supports muscle function and reduces cramping, aiding in recovery. -Anti-inflammatory Diet:Consuming anti-inflammatory foods such as berries, leafy greens, nuts, and omega-3-rich sources (like salmon) can  help reduce inflammation and support muscle recovery. -Avoid Aggravating Activities:Refrain from activities that stress the strained muscle until fully healed to avoid re-injury.    Note: This chart has been completed using Engineer, civil (consulting) software, and while attempts have been made to ensure accuracy, certain words and phrases may not be transcribed as intended.    Gilmore Laroche, FNP

## 2023-07-20 NOTE — Patient Instructions (Signed)
I appreciate the opportunity to provide care to you today!    Follow up:  pcp  For muscle strain, the following nonpharmacological interventions can help reduce pain, promote healing, and prevent further injury:  RICE Method:  Rest: Avoid using the strained muscle to prevent further injury and allow time for healing. Ice: Apply ice packs to the affected area for 15-20 minutes every 1-2 hours during the first 48 hours to reduce inflammation and numb pain. Compression: Use an elastic bandage to provide gentle compression, helping to reduce swelling. Elevation: Elevate the injured area above heart level when possible to minimize swelling. Gentle Stretching and Range-of-Motion Exercises:  After the acute phase (usually 48-72 hours), gentle stretching can improve flexibility and prevent stiffness. Avoid any movements that cause pain. Heat Therapy (after initial swelling has reduced):  Applying heat can help relax muscles, improve circulation, and relieve stiffness. Use a warm compress or heating pad for 15-20 minutes at a time. Massage:  Gentle massage can help relieve tension in the surrounding muscles and improve blood flow, which may speed healing. Strengthening Exercises:  Once pain has subsided, gradual strengthening exercises can help prevent future strains. Work with a physical therapist if necessary for a safe progression. Adequate Hydration:  Staying well-hydrated supports muscle function and reduces cramping, aiding in recovery. Anti-inflammatory Diet:  Consuming anti-inflammatory foods such as berries, leafy greens, nuts, and omega-3-rich sources (like salmon) can help reduce inflammation and support muscle recovery. Avoid Aggravating Activities: Refrain from activities that stress the strained muscle until fully healed to avoid re-injury.    Please continue to a heart-healthy diet and increase your physical activities. Try to exercise for at least five days a week.     It was a pleasure to see you and I look forward to continuing to work together on your health and well-being. Please do not hesitate to call the office if you need care or have questions about your care.  In case of emergency, please visit the Emergency Department for urgent care, or contact our clinic at [phone] to schedule an appointment. We're here to help you!   Have a wonderful day and week. With Gratitude, Gilmore Laroche MSN, FNP-BC

## 2023-07-20 NOTE — Assessment & Plan Note (Signed)
For muscle strain, the following nonpharmacological interventions can help reduce pain, promote healing, and prevent further injury:  RICE Method: Rest: Avoid using the strained muscle to prevent further injury and allow time for healing. Ice: Apply ice packs to the affected area for 15-20 minutes every 1-2 hours during the first 48 hours to reduce inflammation and numb pain. Compression: Use an elastic bandage to provide gentle compression, helping to reduce swelling. Elevation: Elevate the injured area above heart level when possible to minimize swelling. -Gentle Stretching and Range-of-Motion Exercises:After the acute phase (usually 48-72 hours), gentle stretching can improve flexibility and prevent stiffness. Avoid any movements that cause pain. -Heat Therapy (after initial swelling has reduced):Applying heat can help relax muscles, improve circulation, and relieve stiffness. Use a warm compress or heating pad for 15-20 minutes at a time. -Massage:Gentle massage can help relieve tension in the surrounding muscles and improve blood flow, which may speed healing. -Strengthening Exercises:Once pain has subsided, gradual strengthening exercises can help prevent future strains. Work with a physical therapist if necessary for a safe progression. -Adequate Hydration:Staying well-hydrated supports muscle function and reduces cramping, aiding in recovery. -Anti-inflammatory Diet:Consuming anti-inflammatory foods such as berries, leafy greens, nuts, and omega-3-rich sources (like salmon) can help reduce inflammation and support muscle recovery. -Avoid Aggravating Activities:Refrain from activities that stress the strained muscle until fully healed to avoid re-injury.

## 2023-08-15 ENCOUNTER — Other Ambulatory Visit: Payer: Self-pay | Admitting: Cardiology

## 2023-08-15 ENCOUNTER — Other Ambulatory Visit: Payer: Self-pay | Admitting: Family Medicine

## 2023-08-15 DIAGNOSIS — E1159 Type 2 diabetes mellitus with other circulatory complications: Secondary | ICD-10-CM

## 2023-08-15 DIAGNOSIS — I10 Essential (primary) hypertension: Secondary | ICD-10-CM

## 2023-09-13 ENCOUNTER — Other Ambulatory Visit: Payer: Self-pay

## 2023-09-13 DIAGNOSIS — I10 Essential (primary) hypertension: Secondary | ICD-10-CM | POA: Diagnosis not present

## 2023-09-13 DIAGNOSIS — Z122 Encounter for screening for malignant neoplasm of respiratory organs: Secondary | ICD-10-CM

## 2023-09-13 DIAGNOSIS — Z87891 Personal history of nicotine dependence: Secondary | ICD-10-CM

## 2023-09-13 DIAGNOSIS — E785 Hyperlipidemia, unspecified: Secondary | ICD-10-CM | POA: Diagnosis not present

## 2023-09-13 DIAGNOSIS — E1159 Type 2 diabetes mellitus with other circulatory complications: Secondary | ICD-10-CM | POA: Diagnosis not present

## 2023-09-13 DIAGNOSIS — Z125 Encounter for screening for malignant neoplasm of prostate: Secondary | ICD-10-CM | POA: Diagnosis not present

## 2023-09-13 DIAGNOSIS — E559 Vitamin D deficiency, unspecified: Secondary | ICD-10-CM | POA: Diagnosis not present

## 2023-09-14 ENCOUNTER — Ambulatory Visit (INDEPENDENT_AMBULATORY_CARE_PROVIDER_SITE_OTHER): Payer: Medicare HMO | Admitting: Family Medicine

## 2023-09-14 ENCOUNTER — Encounter: Payer: Self-pay | Admitting: Family Medicine

## 2023-09-14 VITALS — BP 120/80 | HR 91 | Ht 72.0 in | Wt 272.1 lb

## 2023-09-14 DIAGNOSIS — Z0001 Encounter for general adult medical examination with abnormal findings: Secondary | ICD-10-CM

## 2023-09-14 DIAGNOSIS — E118 Type 2 diabetes mellitus with unspecified complications: Secondary | ICD-10-CM

## 2023-09-14 DIAGNOSIS — E785 Hyperlipidemia, unspecified: Secondary | ICD-10-CM | POA: Diagnosis not present

## 2023-09-14 DIAGNOSIS — I10 Essential (primary) hypertension: Secondary | ICD-10-CM | POA: Diagnosis not present

## 2023-09-14 DIAGNOSIS — E1169 Type 2 diabetes mellitus with other specified complication: Secondary | ICD-10-CM

## 2023-09-14 DIAGNOSIS — Z720 Tobacco use: Secondary | ICD-10-CM

## 2023-09-14 LAB — CMP14+EGFR
ALT: 20 [IU]/L (ref 0–44)
AST: 16 [IU]/L (ref 0–40)
Albumin: 4.2 g/dL (ref 3.8–4.9)
Alkaline Phosphatase: 71 [IU]/L (ref 44–121)
BUN/Creatinine Ratio: 17 (ref 10–24)
BUN: 17 mg/dL (ref 8–27)
Bilirubin Total: 0.8 mg/dL (ref 0.0–1.2)
CO2: 23 mmol/L (ref 20–29)
Calcium: 9.3 mg/dL (ref 8.6–10.2)
Chloride: 97 mmol/L (ref 96–106)
Creatinine, Ser: 0.99 mg/dL (ref 0.76–1.27)
Globulin, Total: 2.5 g/dL (ref 1.5–4.5)
Glucose: 193 mg/dL — ABNORMAL HIGH (ref 70–99)
Potassium: 3.9 mmol/L (ref 3.5–5.2)
Sodium: 139 mmol/L (ref 134–144)
Total Protein: 6.7 g/dL (ref 6.0–8.5)
eGFR: 87 mL/min/{1.73_m2} (ref >59)

## 2023-09-14 LAB — CBC
Hematocrit: 41.5 % (ref 37.5–51.0)
Hemoglobin: 13.5 g/dL (ref 13.0–17.7)
MCH: 28.1 pg (ref 26.6–33.0)
MCHC: 32.5 g/dL (ref 31.5–35.7)
MCV: 87 fL (ref 79–97)
Platelets: 337 10*3/uL (ref 150–450)
RBC: 4.8 x10E6/uL (ref 4.14–5.80)
RDW: 12.9 % (ref 11.6–15.4)
WBC: 8.2 10*3/uL (ref 3.4–10.8)

## 2023-09-14 LAB — HEMOGLOBIN A1C
Est. average glucose Bld gHb Est-mCnc: 197 mg/dL
Hgb A1c MFr Bld: 8.5 % — ABNORMAL HIGH (ref 4.8–5.6)

## 2023-09-14 LAB — PSA: Prostate Specific Ag, Serum: 1.3 ng/mL (ref 0.0–4.0)

## 2023-09-14 LAB — LIPID PANEL
Chol/HDL Ratio: 2.9 {ratio} (ref 0.0–5.0)
Cholesterol, Total: 155 mg/dL (ref 100–199)
HDL: 53 mg/dL (ref >39)
LDL Chol Calc (NIH): 74 mg/dL (ref 0–99)
Triglycerides: 168 mg/dL — ABNORMAL HIGH (ref 0–149)
VLDL Cholesterol Cal: 28 mg/dL (ref 5–40)

## 2023-09-14 LAB — VITAMIN D 25 HYDROXY (VIT D DEFICIENCY, FRACTURES): Vit D, 25-Hydroxy: 25.1 ng/mL — ABNORMAL LOW (ref 30.0–100.0)

## 2023-09-14 LAB — TSH: TSH: 3.66 u[IU]/mL (ref 0.450–4.500)

## 2023-09-14 MED ORDER — TIRZEPATIDE 2.5 MG/0.5ML ~~LOC~~ SOAJ: 2.5000 mg | SUBCUTANEOUS | 0 refills | Status: DC

## 2023-09-14 MED ORDER — TIRZEPATIDE 5 MG/0.5ML ~~LOC~~ SOAJ: 5.0000 mg | SUBCUTANEOUS | 2 refills | Status: DC

## 2023-09-14 NOTE — Assessment & Plan Note (Signed)
 Hyperlipidemia:Low fat diet discussed and encouraged.   Lipid Panel  Lab Results  Component Value Date   CHOL 155 09/13/2023   HDL 53 09/13/2023   LDLCALC 74 09/13/2023   TRIG 168 (H) 09/13/2023   CHOLHDL 2.9 09/13/2023     Elevated tG. Modify diet , no med change,  rept in 12 weeks

## 2023-09-14 NOTE — Assessment & Plan Note (Signed)
 Controlled, no change in medication DASH diet and commitment to daily physical activity for a minimum of 30 minutes discussed and encouraged, as a part of hypertension management. The importance of attaining a healthy weight is also discussed.     09/14/2023    9:03 AM 09/14/2023    8:05 AM 07/20/2023    3:12 PM 06/06/2023    8:08 AM 05/22/2023    9:00 AM 05/22/2023    8:16 AM 12/20/2022    8:09 AM  BP/Weight  Systolic BP 120 137 127 -- 128 132 122  Diastolic BP 80 89 86 -- 82 88 88  Wt. (Lbs)  272.12 271.04 261  264.08   BMI  36.91 kg/m2 36.76 kg/m2 35.4 kg/m2  35.82 kg/m2

## 2023-09-14 NOTE — Assessment & Plan Note (Addendum)
 Diabetes associated with hypertension, hyperlipidemia, and obesity  [name] is reminded of the importance of commitment to daily physical activity for 30 minutes or more, as able and the need to limit carbohydrate intake to 30 to 60 grams per meal to help with blood sugar control. Uncontrolled ,  start mounjaro  2.5 mg and titrate up to 5 mg    The need to take medication as prescribed, test blood sugar as directed, and to call between visits if there is a concern that blood sugar is uncontrolled is also discussed.   [name] is reminded of the importance of daily foot exam, annual eye examination, and good blood sugar, blood pressure and cholesterol control.     Latest Ref Rng & Units 09/13/2023    8:02 AM 05/22/2023    9:18 AM 05/21/2023    8:02 AM 12/27/2022    8:08 AM 07/25/2022    7:35 AM  Diabetic Labs  HbA1c 4.8 - 5.6 % 8.5   7.7  6.8  6.4   Micro/Creat Ratio 0 - 29 mg/g creat  18      Chol 100 - 199 mg/dL 844    855  836   HDL >60 mg/dL 53    54  49   Calc LDL 0 - 99 mg/dL 74    72  91   Triglycerides 0 - 149 mg/dL 831    95  871   Creatinine 0.76 - 1.27 mg/dL 9.00   8.92  8.84  9.00       09/14/2023    9:03 AM 09/14/2023    8:05 AM 07/20/2023    3:12 PM 06/06/2023    8:08 AM 05/22/2023    9:00 AM 05/22/2023    8:16 AM 12/20/2022    8:09 AM  BP/Weight  Systolic BP 120 137 127 -- 128 132 122  Diastolic BP 80 89 86 -- 82 88 88  Wt. (Lbs)  272.12 271.04 261  264.08   BMI  36.91 kg/m2 36.76 kg/m2 35.4 kg/m2  35.82 kg/m2       05/23/2023    9:41 AM 08/08/2022   12:00 AM  Foot/eye exam completion dates  Eye Exam --     No Retinopathy         This result is from an external source.

## 2023-09-14 NOTE — Progress Notes (Signed)
 Dakota Mendez     MRN: [id number]      DOB: [birth year]  Chief Complaint  Patient presents with   Annual Exam    CPE     HPI: Patient is in for annual physical exam.  Recent labs, if available are reviewed.Blood sugar  less well controlled with weight gain, med added  Immunization is reviewed , and  needs to be updated    PE; BP 120/80   Pulse 91   Ht 6' (1.829 m)   Wt 272 lb 1.9 oz (123.4 kg)   SpO2 94%   BMI 36.91 kg/m   Pleasant male, alert and oriented x 3, in no cardio-pulmonary distress. Afebrile. HEENT No facial trauma or asymetry. Sinuses non tender. EOMI External ears normal,  Neck: supple, no adenopathy,JVD or thyromegaly.No bruits.  Chest: Clear to ascultation bilaterally.No crackles or wheezes. Non tender to palpation  Cardiovascular system; Heart sounds normal,  S1 and  S2 ,no S3.  No murmur, or thrill. Apical beat not displaced Peripheral pulses normal.  Abdomen: Soft, non tender,  Musculoskeletal exam: Full ROM of spine, hips , shoulders and knees. No deformity ,swelling or crepitus noted. No muscle wasting or atrophy.   Neurologic: Cranial nerves 2 to 12 intact. Power, tone ,sensation  normal throughout. No disturbance in gait. No tremor.  Skin: Intact, no ulceration, erythema , scaling or rash noted. Pigmentation normal throughout  Psych; Normal mood and affect. Judgement and concentration normal   Assessment & Plan:  Encounter for Medicare annual examination with abnormal findings Annual exam as documented. Counseling done  re healthy lifestyle involving commitment to 150 minutes exercise per week, heart healthy diet, and attaining healthy weight.The importance of adequate sleep also discussed. Regular seat belt use and home safety, is also discussed. Changes in health habits are decided on by the patient with goals and time frames  set for achieving them. Immunization and cancer screening needs are specifically  addressed at this visit.   Tobacco abuse Remains nicotine free x 1 2months  Hyperlipidemia associated with type 2 diabetes mellitus (HCC) Diabetes associated with hypertension, hyperlipidemia, and obesity  Mr. Royse is reminded of the importance of commitment to daily physical activity for 30 minutes or more, as able and the need to limit carbohydrate intake to 30 to 60 grams per meal to help with blood sugar control. Uncontrolled ,  start mounjaro  2.5 mg and titrate up to 5 mg    The need to take medication as prescribed, test blood sugar as directed, and to call between visits if there is a concern that blood sugar is uncontrolled is also discussed.   Mr. Sausedo is reminded of the importance of daily foot exam, annual eye examination, and good blood sugar, blood pressure and cholesterol control.     Latest Ref Rng & Units 09/13/2023    8:02 AM 05/22/2023    9:18 AM 05/21/2023    8:02 AM 12/27/2022    8:08 AM 07/25/2022    7:35 AM  Diabetic Labs  HbA1c 4.8 - 5.6 % 8.5   7.7  6.8  6.4   Micro/Creat Ratio 0 - 29 mg/g creat  18      Chol 100 - 199 mg/dL 844    855  836   HDL >60 mg/dL 53    54  49   Calc LDL 0 - 99 mg/dL 74    72  91   Triglycerides 0 - 149 mg/dL 831  95  128   Creatinine 0.76 - 1.27 mg/dL 9.00   8.92  8.84  9.00       09/14/2023    9:03 AM 09/14/2023    8:05 AM 07/20/2023    3:12 PM 06/06/2023    8:08 AM 05/22/2023    9:00 AM 05/22/2023    8:16 AM 12/20/2022    8:09 AM  BP/Weight  Systolic BP 120 137 127 -- 128 132 122  Diastolic BP 80 89 86 -- 82 88 88  Wt. (Lbs)  272.12 271.04 261  264.08   BMI  36.91 kg/m2 36.76 kg/m2 35.4 kg/m2  35.82 kg/m2       05/23/2023    9:41 AM 08/08/2022   12:00 AM  Foot/eye exam completion dates  Eye Exam --     No Retinopathy         This result is from an external source.        Essential hypertension Controlled, no change in medication DASH diet and commitment to daily physical activity for a minimum of 30 minutes  discussed and encouraged, as a part of hypertension management. The importance of attaining a healthy weight is also discussed.     09/14/2023    9:03 AM 09/14/2023    8:05 AM 07/20/2023    3:12 PM 06/06/2023    8:08 AM 05/22/2023    9:00 AM 05/22/2023    8:16 AM 12/20/2022    8:09 AM  BP/Weight  Systolic BP 120 137 127 -- 128 132 122  Diastolic BP 80 89 86 -- 82 88 88  Wt. (Lbs)  272.12 271.04 261  264.08   BMI  36.91 kg/m2 36.76 kg/m2 35.4 kg/m2  35.82 kg/m2        Hyperlipidemia LDL goal <100 Hyperlipidemia:Low fat diet discussed and encouraged.   Lipid Panel  Lab Results  Component Value Date   CHOL 155 09/13/2023   HDL 53 09/13/2023   LDLCALC 74 09/13/2023   TRIG 168 (H) 09/13/2023   CHOLHDL 2.9 09/13/2023     Elevated tG. Modify diet , no med change,  rept in 12 weeks

## 2023-09-14 NOTE — Assessment & Plan Note (Signed)
 Remains nicotine free x 1 2months

## 2023-09-14 NOTE — Assessment & Plan Note (Signed)

## 2023-09-14 NOTE — Patient Instructions (Addendum)
 F/U in 13 weeks, call if you need me sooner  New additional medicine for diabetes is mounjaro  once weekly, continue metformin   TdaP and shingrix # 2 are needed and are at your pharmacy  Vit D3, 1000 units once daily, need to start this, since your level is low  Go for the gold!  Fasting lipid, cmp and EGFr, HBA1C  CONGRATS on no smoking x 12 months!!   It is important that you exercise regularly at least 30 minutes 5 times a week. If you develop chest pain, have severe difficulty breathing, or feel very tired, stop exercising immediately and seek medical attention

## 2023-09-27 ENCOUNTER — Other Ambulatory Visit: Payer: Self-pay

## 2023-09-27 DIAGNOSIS — Z87891 Personal history of nicotine dependence: Secondary | ICD-10-CM

## 2023-09-27 DIAGNOSIS — Z122 Encounter for screening for malignant neoplasm of respiratory organs: Secondary | ICD-10-CM

## 2023-09-27 DIAGNOSIS — D72829 Elevated white blood cell count, unspecified: Secondary | ICD-10-CM

## 2023-09-28 ENCOUNTER — Inpatient Hospital Stay: Payer: Medicare HMO | Attending: Physician Assistant | Admitting: Oncology

## 2023-09-28 ENCOUNTER — Inpatient Hospital Stay: Payer: Medicare HMO

## 2023-09-28 VITALS — BP 137/88 | HR 95 | Temp 97.7°F | Resp 17 | Ht 72.0 in | Wt 277.8 lb

## 2023-09-28 DIAGNOSIS — D72820 Lymphocytosis (symptomatic): Secondary | ICD-10-CM | POA: Insufficient documentation

## 2023-09-28 DIAGNOSIS — I10 Essential (primary) hypertension: Secondary | ICD-10-CM | POA: Diagnosis not present

## 2023-09-28 DIAGNOSIS — D72829 Elevated white blood cell count, unspecified: Secondary | ICD-10-CM | POA: Diagnosis not present

## 2023-09-28 DIAGNOSIS — Z122 Encounter for screening for malignant neoplasm of respiratory organs: Secondary | ICD-10-CM

## 2023-09-28 DIAGNOSIS — E119 Type 2 diabetes mellitus without complications: Secondary | ICD-10-CM | POA: Insufficient documentation

## 2023-09-28 DIAGNOSIS — Z87891 Personal history of nicotine dependence: Secondary | ICD-10-CM

## 2023-09-28 DIAGNOSIS — R700 Elevated erythrocyte sedimentation rate: Secondary | ICD-10-CM | POA: Diagnosis not present

## 2023-09-28 DIAGNOSIS — D649 Anemia, unspecified: Secondary | ICD-10-CM | POA: Insufficient documentation

## 2023-09-28 LAB — CBC WITH DIFFERENTIAL/PLATELET
Abs Immature Granulocytes: 0.02 10*3/uL (ref 0.00–0.07)
Basophils Absolute: 0.1 10*3/uL (ref 0.0–0.1)
Basophils Relative: 1 %
Eosinophils Absolute: 0.2 10*3/uL (ref 0.0–0.5)
Eosinophils Relative: 2 %
HCT: 38.9 % — ABNORMAL LOW (ref 39.0–52.0)
Hemoglobin: 12.8 g/dL — ABNORMAL LOW (ref 13.0–17.0)
Immature Granulocytes: 0 %
Lymphocytes Relative: 30 %
Lymphs Abs: 2.6 10*3/uL (ref 0.7–4.0)
MCH: 28.6 pg (ref 26.0–34.0)
MCHC: 32.9 g/dL (ref 30.0–36.0)
MCV: 86.8 fL (ref 80.0–100.0)
Monocytes Absolute: 0.7 10*3/uL (ref 0.1–1.0)
Monocytes Relative: 8 %
Neutro Abs: 5.2 10*3/uL (ref 1.7–7.7)
Neutrophils Relative %: 59 %
Platelets: 291 10*3/uL (ref 150–400)
RBC: 4.48 MIL/uL (ref 4.22–5.81)
RDW: 13.2 % (ref 11.5–15.5)
WBC: 8.7 10*3/uL (ref 4.0–10.5)
nRBC: 0 % (ref 0.0–0.2)

## 2023-09-28 NOTE — Patient Instructions (Signed)
Pick up Iron and B12 supplements. Recommend 325 mg ferrous sulfate daily and 1000 mcg B12 daily.   May need stool softener with iron.   Recheck labs in 6 months and we will add on nutritional labs such as b12 and iron labs.

## 2023-09-28 NOTE — Progress Notes (Signed)
[facility] [address] [place], [place] [address]   CLINIC:  Medical Oncology/Hematology  PCP:  Kerri Perches, MD [address] [place] [address] [phone]   REASON FOR VISIT:  Follow-up for leukocytosis  PRIOR THERAPY: None  CURRENT THERAPY: Under workup  INTERVAL HISTORY:   Dakota Mendez 63 y.o. male returns for routine follow-up of leukocytosis.  He was seen in clinic by Rojelio Brenner on 09/24/22.   In the interim, he denies any hospitalizations, surgeries or changes to his baseline health.  He denies any recurrent infections. Denies any fevers or night sweats.  Reports he has gained weight because he injured his right leg and has been unable to walk.  He started walking again this week and is back at the [facility] with the [employer] sne[employer] program.  Patient does not take any steroid medications.  No prior splenectomy.  No known history of chronic inflammatory or autoimmune disorders.  He has 100% energy and 100% appetite. He endorses that he is maintaining a stable weight.  He denies any bleeding, melena or hematochezia.  Reports normal bowel movements.  Reports he quit smoking about a year ago.  ASSESSMENT & PLAN:  1.  Leukocytosis, predominantly neutrophilic - Mildly elevated WBC since 2009, ranging from 11,000-13,900, predominantly neutrophils but with occasional lymphocytosis - He denies any recurrent infections.  No steroid medications.  No prior splenectomy.  No known history of inflammatory/autoimmune disease. - Denies any fevers or night sweats. Lost about 15 pounds in the last 6 months but was trying to lose weight with exercise and diet control.  - Current active smoker, approximately 1 PPD x 40 years - Hematology workup (08/25/2022): BCR ABL FISH negative Negative JAK2, CALR, MPL Normal CRP.  Mildly elevated ESR 19.  LDH normal. - Most recent CBC (09/13/23) show white count of 8.7 (8.2).  -MPN workup was negative.  Suspect  reactive leukocytosis. -She has not had an elevated white count in over a year. -No additional workup needed.  2.  Tobacco use - Smoked 1 pack/day for 40 years.  Quit 1 year ago. - Low-dose CT scan on 07/27/2022 was lung RADS 2S -He is scheduled for his next CT chest on 11/01/2023.. -Will call with results.  3.   Social/family history: - PMH: Chronic neck/back pain, diabetes, hyperlipidemia, hypertension, obesity, sleep apnea - Lives with wife at home.  Independent of ADLs and IADLs.  He is retired Naval architect.  Current active smoker, approximately 1 pack/day for 40 years. - No family history of leukemia or malignancies.  4.  Anemia: -Found incidentally on recent lab draw. -Labs from 09/28/2023 show hemoglobin of 12.8.  No other low hemoglobin over the last 15 years. -He denies any bleeding.  He had a colonoscopy on 08/08/2017 which revealed 2 small polyps and external and internal hemorrhoids.  Pathology revealed hyperplastic and recommended 10-year follow-up. -Discussed additional lab work today with nutritional panel but he would like to hold off. -Discussed starting an iron and B12 supplement and will recheck labs in 6 months.  If lab work is normal, will refer back to PCP.   PLAN SUMMARY: >> Labs only (CBC/D, panel, ferritin, B12, folate, MMA and copper) in 6 months >> Telephone visit a few days later.    REVIEW OF SYSTEMS: Patient denies any acute complaints at today's visit  Review of Systems  Constitutional:  Positive for unexpected weight change (Weight gain). Negative for fatigue.  Musculoskeletal:  Positive for arthralgias (  Right leg although this is improved).     PHYSICAL EXAM:  ECOG PERFORMANCE STATUS: 0 - Asymptomatic  Vitals:   09/28/23 0848  BP: 137/88  Pulse: 95  Resp: 17  Temp: 97.7 F (36.5 C)  SpO2: 97%   Filed Weights   09/28/23 0848  Weight: 277 lb 12.8 oz (126 kg)   Physical Exam Constitutional:      Appearance: Normal appearance. He is  obese.  Cardiovascular:     Rate and Rhythm: Normal rate and regular rhythm.  Pulmonary:     Effort: Pulmonary effort is normal.     Breath sounds: Normal breath sounds.  Abdominal:     General: Bowel sounds are normal.     Palpations: Abdomen is soft.  Musculoskeletal:        General: No swelling. Normal range of motion.  Neurological:     Mental Status: He is alert and oriented to person, place, and time. Mental status is at baseline.     PAST MEDICAL/SURGICAL HISTORY:  Past Medical History:  Diagnosis Date   Chronic back pain    Chronic neck pain    Diabetes mellitus    Hyperlipidemia    Hypertension    Obesity    Sleep apnea    Not compliant with CPAP   Past Surgical History:  Procedure Laterality Date   CERVICAL SPINE SURGERY     COLONOSCOPY N/A 08/08/2017   Procedure: COLONOSCOPY;  Surgeon: Malissa Hippo, MD;  Location: [facility];  Service: Endoscopy;  Laterality: N/A;  930   SPINE SURGERY  09/21/2011   cervial spine    SOCIAL HISTORY:  Social History   Socioeconomic History   Marital status: Married    Spouse name: Not on file   Number of children: 4   Years of education: Not on file   Highest education level: 9th grade  Occupational History   Occupation: disabled-truck dirver  Tobacco Use   Smoking status: Former    Current packs/day: 0.00    Average packs/day: 0.5 packs/day for 40.0 years (20.0 ttl pk-yrs)    Types: Cigarettes    Start date: 09/11/1982    Quit date: 09/11/2022    Years since quitting: 1.0   Smokeless tobacco: Never  Vaping Use   Vaping status: Never Used  Substance and Sexual Activity   Alcohol use: No   Drug use: No   Sexual activity: Yes  Other Topics Concern   Not on file  Social History Narrative   Not on file   Social Drivers of Health   Financial Resource Strain: Low Risk  (06/06/2023)   Overall Financial Resource Strain (CARDIA)    Difficulty of Paying Living Expenses: Not hard at all  Food Insecurity: No Food  Insecurity (06/06/2023)   Hunger Vital Sign    Worried About Running Out of Food in the Last Year: Never true    Ran Out of Food in the Last Year: Never true  Transportation Needs: No Transportation Needs (06/06/2023)   PRAPARE - Administrator, Civil Service (Medical): No    Lack of Transportation (Non-Medical): No  Physical Activity: Sufficiently Active (06/06/2023)   Exercise Vital Sign    Days of Exercise per Week: 5 days    Minutes of Exercise per Session: 30 min  Stress: No Stress Concern Present (06/06/2023)   [employer] of Occupational Health - Occupational Stress Questionnaire    Feeling of Stress : Not at all  Social Connections: Socially Integrated (  06/06/2023)   Social Connection and Isolation Panel [NHANES]    Frequency of Communication with Friends and Family: More than three times a week    Frequency of Social Gatherings with Friends and Family: More than three times a week    Attends Religious Services: More than 4 times per year    Active Member of [employer] or Organizations: Yes    Attends Engineer, structural: More than 4 times per year    Marital Status: Married  Catering manager Violence: Not At Risk (06/06/2023)   Humiliation, Afraid, Rape, and Kick questionnaire    Fear of Current or Ex-Partner: No    Emotionally Abused: No    Physically Abused: No    Sexually Abused: No    FAMILY HISTORY:  Family History  Problem Relation Age of Onset   Cirrhosis Father    Heart attack Mother    Depression Mother    Stroke Mother    Hearing loss Brother    Cirrhosis Brother    Diabetes Sister    Sleep apnea Son    Hypertension Son     CURRENT MEDICATIONS:  Outpatient Encounter Medications as of 09/28/2023  Medication Sig   Alcohol Swabs PADS 1 each by Does not apply route as directed.   amLODipine (NORVASC) 10 MG tablet TAKE 1 TABLET EVERY DAY   Blood Glucose Monitoring Suppl (TRUE METRIX METER) w/Device KIT USE AS DIRECTED   cloNIDine  (CATAPRES) 0.3 MG tablet TAKE 1 TABLET EVERY DAY   clotrimazole-betamethasone (LOTRISONE) cream Apply twice daily, as needed, to affected aresa   IBU 800 MG tablet Take 800 mg by mouth 2 (two) times daily.   losartan-hydrochlorothiazide (HYZAAR) 100-25 MG tablet TAKE 1 TABLET EVERY DAY   metFORMIN (GLUCOPHAGE) 1000 MG tablet TAKE 1 TABLET TWICE DAILY WITH MEALS   potassium chloride (KLOR-CON M) 10 MEQ tablet TAKE 1 TABLET EVERY DAY   rosuvastatin (CRESTOR) 10 MG tablet TAKE 1 TABLET EVERY DAY (STOP PRAVASTATIN)   tirzepatide (MOUNJARO) 2.5 MG/0.5ML Pen Inject 2.5 mg into the skin once a week.   [START ON 10/12/2023] tirzepatide (MOUNJARO) 5 MG/0.5ML Pen Inject 5 mg into the skin once a week.   TRUE METRIX BLOOD GLUCOSE TEST test strip TEST BLOOD SUGAR  UP  TO FOUR TIMES DAILY AS DIRECTED   TRUEplus Lancets 33G MISC 1 each by Does not apply route as directed.   UNABLE TO FIND Med Name: TDAP VACCINE   No facility-administered encounter medications on file as of 09/28/2023.    ALLERGIES:  Allergies  Allergen Reactions   Jardiance [Empagliflozin] Other (See Comments)    Redness and irritation of genitals    LABORATORY DATA:  I have reviewed the labs as listed.  CBC    Component Value Date/Time   WBC 8.7 09/28/2023 0801   RBC 4.48 09/28/2023 0801   HGB 12.8 (L) 09/28/2023 0801   HGB 13.5 09/13/2023 0802   HCT 38.9 (L) 09/28/2023 0801   HCT 41.5 09/13/2023 0802   PLT 291 09/28/2023 0801   PLT 337 09/13/2023 0802   MCV 86.8 09/28/2023 0801   MCV 87 09/13/2023 0802   MCV 87 09/18/2011 0834   MCH 28.6 09/28/2023 0801   MCHC 32.9 09/28/2023 0801   RDW 13.2 09/28/2023 0801   RDW 12.9 09/13/2023 0802   RDW 13.5 09/18/2011 0834   LYMPHSABS 2.6 09/28/2023 0801   MONOABS 0.7 09/28/2023 0801   EOSABS 0.2 09/28/2023 0801   BASOSABS 0.1 09/28/2023 0801  Latest Ref Rng & Units 09/13/2023    8:02 AM 05/21/2023    8:02 AM 12/27/2022    8:08 AM  CMP  Glucose 70 - 99 mg/dL 045  409  811    BUN 8 - 27 mg/dL 17  17  15    Creatinine 0.76 - 1.27 mg/dL 9.14  7.82  9.56   Sodium 134 - 144 mmol/L 139  140  139   Potassium 3.5 - 5.2 mmol/L 3.9  3.9  4.3   Chloride 96 - 106 mmol/L 97  99  100   CO2 20 - 29 mmol/L 23  24  24    Calcium 8.6 - 10.2 mg/dL 9.3  9.2  9.7   Total Protein 6.0 - 8.5 g/dL 6.7   6.8   Total Bilirubin 0.0 - 1.2 mg/dL 0.8   0.8   Alkaline Phos 44 - 121 IU/L 71   78   AST 0 - 40 IU/L 16   17   ALT 0 - 44 IU/L 20   17     DIAGNOSTIC IMAGING:  I have independently reviewed the relevant imaging and discussed with the patient.   WRAP UP:  All questions were answered. The patient knows to call the clinic with any problems, questions or concerns.  Medical decision making: Low  Time spent on visit: I spent 20 minutes counseling the patient face to face. The total time spent in the appointment was 22 minutes and more than 50% was on counseling.  Mauro Kaufmann, NP  09/25/2022 8:48 AM

## 2023-10-01 ENCOUNTER — Other Ambulatory Visit: Payer: Self-pay | Admitting: Cardiology

## 2023-11-01 ENCOUNTER — Ambulatory Visit (HOSPITAL_COMMUNITY): Payer: Medicare HMO

## 2023-11-14 ENCOUNTER — Other Ambulatory Visit: Payer: Self-pay | Admitting: Family Medicine

## 2023-11-22 NOTE — Progress Notes (Signed)
 Patient cancelled LDCT scheduled on 11/01/2023. Called patient to attempted to reschedule but patient is not interested in rescheduling at this time. Patient states that he will call back when he is prepared to reschedule.

## 2023-12-13 DIAGNOSIS — E785 Hyperlipidemia, unspecified: Secondary | ICD-10-CM | POA: Diagnosis not present

## 2023-12-13 DIAGNOSIS — I10 Essential (primary) hypertension: Secondary | ICD-10-CM | POA: Diagnosis not present

## 2023-12-13 DIAGNOSIS — E118 Type 2 diabetes mellitus with unspecified complications: Secondary | ICD-10-CM | POA: Diagnosis not present

## 2023-12-14 ENCOUNTER — Telehealth: Payer: Self-pay | Admitting: Family Medicine

## 2023-12-14 ENCOUNTER — Ambulatory Visit: Payer: Medicare HMO | Admitting: Family Medicine

## 2023-12-14 ENCOUNTER — Other Ambulatory Visit (HOSPITAL_COMMUNITY): Payer: Self-pay

## 2023-12-14 VITALS — BP 136/84 | HR 92 | Resp 16 | Ht 72.0 in | Wt 278.0 lb

## 2023-12-14 DIAGNOSIS — E785 Hyperlipidemia, unspecified: Secondary | ICD-10-CM | POA: Diagnosis not present

## 2023-12-14 DIAGNOSIS — E1169 Type 2 diabetes mellitus with other specified complication: Secondary | ICD-10-CM

## 2023-12-14 DIAGNOSIS — I10 Essential (primary) hypertension: Secondary | ICD-10-CM

## 2023-12-14 DIAGNOSIS — E66811 Obesity, class 1: Secondary | ICD-10-CM | POA: Diagnosis not present

## 2023-12-14 LAB — CMP14+EGFR
ALT: 19 IU/L (ref 0–44)
AST: 18 IU/L (ref 0–40)
Albumin: 4.1 g/dL (ref 3.8–4.9)
Alkaline Phosphatase: 66 IU/L (ref 44–121)
BUN/Creatinine Ratio: 15 (ref 10–24)
BUN: 15 mg/dL (ref 8–27)
Bilirubin Total: 0.9 mg/dL (ref 0.0–1.2)
CO2: 23 mmol/L (ref 20–29)
Calcium: 9.4 mg/dL (ref 8.6–10.2)
Chloride: 101 mmol/L (ref 96–106)
Creatinine, Ser: 0.97 mg/dL (ref 0.76–1.27)
Globulin, Total: 2.5 g/dL (ref 1.5–4.5)
Glucose: 190 mg/dL — ABNORMAL HIGH (ref 70–99)
Potassium: 4 mmol/L (ref 3.5–5.2)
Sodium: 142 mmol/L (ref 134–144)
Total Protein: 6.6 g/dL (ref 6.0–8.5)
eGFR: 89 mL/min/{1.73_m2} (ref >59)

## 2023-12-14 LAB — LIPID PANEL
Chol/HDL Ratio: 2.7 ratio (ref 0.0–5.0)
Cholesterol, Total: 144 mg/dL (ref 100–199)
HDL: 53 mg/dL (ref >39)
LDL Chol Calc (NIH): 73 mg/dL (ref 0–99)
Triglycerides: 94 mg/dL (ref 0–149)
VLDL Cholesterol Cal: 18 mg/dL (ref 5–40)

## 2023-12-14 LAB — HEMOGLOBIN A1C
Est. average glucose Bld gHb Est-mCnc: 249 mg/dL
Hgb A1c MFr Bld: 10.3 % — ABNORMAL HIGH (ref 4.8–5.6)

## 2023-12-14 MED ORDER — SEMAGLUTIDE(0.25 OR 0.5MG/DOS) 2 MG/3ML ~~LOC~~ SOPN: 0.5000 mg | PEN_INJECTOR | SUBCUTANEOUS | 1 refills | Status: DC

## 2023-12-14 MED ORDER — GLIPIZIDE ER 10 MG PO TB24: 10.0000 mg | ORAL_TABLET | Freq: Every day | ORAL | 1 refills | Status: DC

## 2023-12-14 MED ORDER — SEMAGLUTIDE(0.25 OR 0.5MG/DOS) 2 MG/3ML ~~LOC~~ SOPN: 0.2500 mg | PEN_INJECTOR | SUBCUTANEOUS | 0 refills | Status: DC

## 2023-12-14 NOTE — Assessment & Plan Note (Addendum)
 Diabetes associated with hypertension, hyperlipidemia, and obesity  [name] is reminded of the importance of commitment to daily physical activity for 30 minutes or more, as able and the need to limit carbohydrate intake to 30 to 60 grams per meal to help with blood sugar control.  Uncontrolled noncompliant with medication anddiet  The need to take medication as prescribed, test blood sugar as directed, and to call between visits if there is a concern that blood sugar is uncontrolled is also discussed.   [name] is reminded of the importance of daily foot exam, annual eye examination, and good blood sugar, blood pressure and cholesterol control.     Latest Ref Rng & Units 12/13/2023    8:04 AM 09/13/2023    8:02 AM 05/22/2023    9:18 AM 05/21/2023    8:02 AM 12/27/2022    8:08 AM  Diabetic Labs  HbA1c 4.8 - 5.6 % 10.3  8.5   7.7  6.8   Micro/Creat Ratio 0 - 29 mg/g creat   18     Chol 100 - 199 mg/dL 409  811    914   HDL >78 mg/dL 53  53    54   Calc LDL 0 - 99 mg/dL 73  74    72   Triglycerides 0 - 149 mg/dL 94  295    95   Creatinine 0.76 - 1.27 mg/dL 6.21  3.08   6.57  8.46       12/14/2023    8:54 AM 12/14/2023    8:19 AM 09/28/2023    8:48 AM 09/14/2023    9:03 AM 09/14/2023    8:05 AM 07/20/2023    3:12 PM 06/06/2023    8:08 AM  BP/Weight  Systolic BP 136 141 137 120 137 127 --  Diastolic BP 84 80 88 80 89 86 --  Wt. (Lbs)  278.04 277.8  272.12 271.04 261  BMI  37.71 kg/m2 37.68 kg/m2  36.91 kg/m2 36.76 kg/m2 35.4 kg/m2      05/23/2023    9:41 AM 08/08/2022   12:00 AM  Foot/eye exam completion dates  Eye Exam --     No Retinopathy         This result is from an external source.

## 2023-12-14 NOTE — Patient Instructions (Addendum)
 F/U in 7 weeks with blood sugar  log, call if you need me sooner  Ozempic is sent to [facility] pharmacy , collect and start once weekly injection  New is glipizide 10 mg every morning sent to mail order , start as soon as you get it  Continue metformin as before  Tests and record blood sugar every morning  Goal for fasting blood sugar 80 to 130  Please get chest scan, also Shingrix vaccine  You are referred to diabetic class for individual help and pLEASE go to diabetic class next week this is free and a group session  It is important that you exercise regularly at least 30 minutes 5 times a week. If you develop chest pain, have severe difficulty breathing, or feel very tired, stop exercising immediately and seek medical attention

## 2023-12-14 NOTE — Telephone Encounter (Signed)
 Copied from CRM [id number]. Topic: Clinical - Prescription Issue >> Dec 14, 2023 12:46 PM Priscille Loveless wrote: Reason for CRM:   [id number],6.57 or 0.5MG /DOS, 2 MG/3ML SOPN  Pt states that his cost is 300.00 a month and he can't afford it. Does this medication need a prior authorization for it to cost less?  Please advise

## 2023-12-15 ENCOUNTER — Encounter: Payer: Self-pay | Admitting: Family Medicine

## 2023-12-15 NOTE — Assessment & Plan Note (Signed)
 Hyperlipidemia:Low fat diet discussed and encouraged.   Lipid Panel  Lab Results  Component Value Date   CHOL 144 12/13/2023   HDL 53 12/13/2023   LDLCALC 73 12/13/2023   TRIG 94 12/13/2023   CHOLHDL 2.7 12/13/2023     Controlled, no change in medication

## 2023-12-15 NOTE — Progress Notes (Signed)
 Dakota Mendez     MRN: [id number]      DOB: October 03, 1960  Chief Complaint  Patient presents with   Diabetes    Follow up. States Dakota Mendez is too high and tried to contact his insurance but they have not called him back about a PA. Has been trying to diet and exercise instead.    C/o  polyuria, polydipsia, blurred vision , or hypoglycemic episodes.Has not been testing blood sugar and has  been eating foods like ice cream and cake  HPI Dakota Mendez is here for follow up and re-evaluation of chronic medical conditions, medication management and review of any available recent lab and radiology data.  Preventive health is updated, specifically  Cancer screening and Immunization.   Questions or concerns regarding consultations or procedures which the PT has had in the interim are  addressed. The PT was unable to get prescribed med for his diabetes, had not ben testing blood sugar and is symptomatic from elevated blood sugar ROS Denies recent fever or chills. Denies sinus pressure, nasal congestion, ear pain or sore throat. Denies chest congestion, productive cough or wheezing. Denies chest pains, palpitations and leg swelling Denies abdominal pain, nausea, vomiting,diarrhea or constipation.   Denies dysuria, frequency, hesitancy or incontinence. Denies uncontrolled joint pain, swelling and limitation in mobility. Denies headaches, seizures, numbness, or tingling. Denies depression, anxiety or insomnia. Denies skin break down or rash.   PE  BP 136/84   Pulse 92   Resp 16   Ht 6' (1.829 m)   Wt 278 lb 0.6 oz (126.1 kg)   SpO2 98%   BMI 37.71 kg/m   Patient alert and oriented and in no cardiopulmonary distress.  HEENT: No facial asymmetry, EOMI,     Neck supple .  Chest: Clear to auscultation bilaterally.  CVS: S1, S2 no murmurs, no S3.Regular rate.  ABD: Soft non tender.   Ext: No edema  MS: Adequate ROM spine, shoulders, hips and knees.  Skin: Intact, no ulcerations or  rash noted.  Psych: Good eye contact, normal affect. Memory intact not anxious or depressed appearing.  CNS: CN 2-12 intact, power,  normal throughout.no focal deficits noted.   Assessment & Plan  Type 2 diabetes mellitus with other specified complication (HCC) Diabetes associated with hypertension, hyperlipidemia, and obesity  Dakota Mendez is reminded of the importance of commitment to daily physical activity for 30 minutes or more, as able and the need to limit carbohydrate intake to 30 to 60 grams per meal to help with blood sugar control.  Uncontrolled noncompliant with medication anddiet  The need to take medication as prescribed, test blood sugar as directed, and to call between visits if there is a concern that blood sugar is uncontrolled is also discussed.   Dakota Mendez is reminded of the importance of daily foot exam, annual eye examination, and good blood sugar, blood pressure and cholesterol control.     Latest Ref Rng & Units 12/13/2023    8:04 AM 09/13/2023    8:02 AM 05/22/2023    9:18 AM 05/21/2023    8:02 AM 12/27/2022    8:08 AM  Diabetic Labs  HbA1c 4.8 - 5.6 % 10.3  8.5   7.7  6.8   Micro/Creat Ratio 0 - 29 mg/g creat   18     Chol 100 - 199 mg/dL 409  811    914   HDL >78 mg/dL 53  53    54   Calc LDL 0 -  99 mg/dL 73  74    72   Triglycerides 0 - 149 mg/dL 94  962    95   Creatinine 0.76 - 1.27 mg/dL 9.52  8.41   3.24  4.01       12/14/2023    8:54 AM 12/14/2023    8:19 AM 09/28/2023    8:48 AM 09/14/2023    9:03 AM 09/14/2023    8:05 AM 07/20/2023    3:12 PM 06/06/2023    8:08 AM  BP/Weight  Systolic BP 136 141 137 120 137 127 --  Diastolic BP 84 80 88 80 89 86 --  Wt. (Lbs)  278.04 277.8  272.12 271.04 261  BMI  37.71 kg/m2 37.68 kg/m2  36.91 kg/m2 36.76 kg/m2 35.4 kg/m2      05/23/2023    9:41 AM 08/08/2022   12:00 AM  Foot/eye exam completion dates  Eye Exam --     No Retinopathy         This result is from an external source.        Essential  hypertension Controlled, no change in medication DASH diet and commitment to daily physical activity for a minimum of 30 minutes discussed and encouraged, as a part of hypertension management. The importance of attaining a healthy weight is also discussed.     12/14/2023    8:54 AM 12/14/2023    8:19 AM 09/28/2023    8:48 AM 09/14/2023    9:03 AM 09/14/2023    8:05 AM 07/20/2023    3:12 PM 06/06/2023    8:08 AM  BP/Weight  Systolic BP 136 141 137 120 137 127 --  Diastolic BP 84 80 88 80 89 86 --  Wt. (Lbs)  278.04 277.8  272.12 271.04 261  BMI  37.71 kg/m2 37.68 kg/m2  36.91 kg/m2 36.76 kg/m2 35.4 kg/m2     '  Hyperlipidemia LDL goal <100 Hyperlipidemia:Low fat diet discussed and encouraged.   Lipid Panel  Lab Results  Component Value Date   CHOL 144 12/13/2023   HDL 53 12/13/2023   LDLCALC 73 12/13/2023   TRIG 94 12/13/2023   CHOLHDL 2.7 12/13/2023     Controlled, no change in medication   Obesity (BMI 30.0-34.9)  Patient re-educated about  the importance of commitment to a  minimum of 150 minutes of exercise per week as able.  The importance of healthy food choices with portion control discussed, as well as eating regularly and within a 12 hour window most days. The need to choose "clean , green" food 50 to 75% of the time is discussed, as well as to make water the primary drink and set a goal of 64 ounces water daily.       12/14/2023    8:19 AM 09/28/2023    8:48 AM 09/14/2023    8:05 AM  Weight /BMI  Weight 278 lb 0.6 oz 277 lb 12.8 oz 272 lb 1.9 oz  Height 6' (1.829 m) 6' (1.829 m) 6' (1.829 m)  BMI 37.71 kg/m2 37.68 kg/m2 36.91 kg/m2    unchanged

## 2023-12-15 NOTE — Assessment & Plan Note (Signed)
 Controlled, no change in medication DASH diet and commitment to daily physical activity for a minimum of 30 minutes discussed and encouraged, as a part of hypertension management. The importance of attaining a healthy weight is also discussed.     12/14/2023    8:54 AM 12/14/2023    8:19 AM 09/28/2023    8:48 AM 09/14/2023    9:03 AM 09/14/2023    8:05 AM 07/20/2023    3:12 PM 06/06/2023    8:08 AM  BP/Weight  Systolic BP 136 141 137 120 137 127 --  Diastolic BP 84 80 88 80 89 86 --  Wt. (Lbs)  278.04 277.8  272.12 271.04 261  BMI  37.71 kg/m2 37.68 kg/m2  36.91 kg/m2 36.76 kg/m2 35.4 kg/m2     '

## 2023-12-15 NOTE — Assessment & Plan Note (Signed)
  Patient re-educated about  the importance of commitment to a  minimum of 150 minutes of exercise per week as able.  The importance of healthy food choices with portion control discussed, as well as eating regularly and within a 12 hour window most days. The need to choose "clean , green" food 50 to 75% of the time is discussed, as well as to make water the primary drink and set a goal of 64 ounces water daily.       12/14/2023    8:19 AM 09/28/2023    8:48 AM 09/14/2023    8:05 AM  Weight /BMI  Weight 278 lb 0.6 oz 277 lb 12.8 oz 272 lb 1.9 oz  Height 6' (1.829 m) 6' (1.829 m) 6' (1.829 m)  BMI 37.71 kg/m2 37.68 kg/m2 36.91 kg/m2    unchanged

## 2023-12-17 ENCOUNTER — Encounter: Payer: Self-pay | Admitting: Nutrition

## 2023-12-17 ENCOUNTER — Encounter: Attending: Family Medicine | Admitting: Nutrition

## 2023-12-17 VITALS — Ht 72.0 in | Wt 272.0 lb

## 2023-12-17 DIAGNOSIS — I10 Essential (primary) hypertension: Secondary | ICD-10-CM | POA: Insufficient documentation

## 2023-12-17 DIAGNOSIS — E66811 Obesity, class 1: Secondary | ICD-10-CM | POA: Diagnosis not present

## 2023-12-17 DIAGNOSIS — E785 Hyperlipidemia, unspecified: Secondary | ICD-10-CM | POA: Insufficient documentation

## 2023-12-17 DIAGNOSIS — E1169 Type 2 diabetes mellitus with other specified complication: Secondary | ICD-10-CM | POA: Diagnosis not present

## 2023-12-17 NOTE — Patient Instructions (Signed)
 Goals Eat three meals per day Check blood sugar twice a day Cut out sweets and processed foods Drink only water Let MD know if your blood sugars is below 70 -2-3 a week Ask MD about not starting Glipizide since your blood sugars are much better since stopping sugary foods. Get A1C down to 7% or below.

## 2023-12-17 NOTE — Telephone Encounter (Signed)
 Patient informed.

## 2023-12-17 NOTE — Progress Notes (Signed)
 Medical Nutrition Therapy  Appointment Start time:  0800  Appointment End time:  0900  Primary concerns today: DM Type 2  Referral diagnosis: E11.8 Preferred learning style: No Preference  Learning readiness: Ready    NUTRITION ASSESSMENT  63 yr old black male referred fro Type 2 Dm. PCP Dr. Lodema Hong. To begin taking Ozempic soon and Glipizide. "I just cut out all my sweets and sodas since Friday and my blood sugars have come down from 180's down to 140's now." Quit smoking a year ago. Likes to fish. A1C 10.3% Has symptoms of high blood sugars and occasional low blood sugar symptoim when he skips a meal. He is very motivated to make lifestyle changes with his diet and exercise to improve his DM. He is willing to work with following a whole plant based diet and lifestyle.  Clinical Medical Hx:  Past Medical History:  Diagnosis Date   Chronic back pain    Chronic neck pain    Diabetes mellitus    Hyperlipidemia    Hypertension    Obesity    Sleep apnea    Not compliant with CPAP    Medications:  Current Outpatient Medications on File Prior to Visit  Medication Sig Dispense Refill   Alcohol Swabs PADS 1 each by Does not apply route as directed. 100 each 0   amLODipine (NORVASC) 10 MG tablet TAKE 1 TABLET EVERY DAY 90 tablet 3   Blood Glucose Monitoring Suppl (TRUE METRIX METER) w/Device KIT USE AS DIRECTED 1 kit 0   cloNIDine (CATAPRES) 0.3 MG tablet TAKE 1 TABLET EVERY DAY 90 tablet 3   clotrimazole-betamethasone (LOTRISONE) cream Apply twice daily, as needed, to affected aresa 45 g 1   glipiZIDE (GLUCOTROL XL) 10 MG 24 hr tablet Take 1 tablet (10 mg total) by mouth daily with breakfast. 90 tablet 1   IBU 800 MG tablet Take 800 mg by mouth 2 (two) times daily.     losartan-hydrochlorothiazide (HYZAAR) 100-25 MG tablet TAKE 1 TABLET EVERY DAY 90 tablet 3   metFORMIN (GLUCOPHAGE) 1000 MG tablet TAKE 1 TABLET TWICE DAILY WITH MEALS 180 tablet 3   potassium chloride (KLOR-CON M) 10  MEQ tablet TAKE 1 TABLET EVERY DAY 90 tablet 3   rosuvastatin (CRESTOR) 10 MG tablet TAKE 1 TABLET EVERY 30 tablet 11   Semaglutide,0.25 or 0.5MG /DOS, 2 MG/3ML SOPN Inject 0.25 mg into the skin once a week. 3 mL 0   [START ON 01/11/2024] Semaglutide,0.25 or 0.5MG /DOS, 2 MG/3ML SOPN Inject 0.5 mg into the skin once a week. 3 mL 1   TRUE METRIX BLOOD GLUCOSE TEST test strip TEST BLOOD SUGAR  UP  TO FOUR TIMES DAILY AS DIRECTED 100 strip 0   TRUEplus Lancets 33G MISC 1 each by Does not apply route as directed. 100 each 1   UNABLE TO FIND Med Name: TDAP VACCINE 1 each 0   No current facility-administered medications on file prior to visit.    Labs:  Lab Results  Component Value Date   HGBA1C 10.3 (H) 12/13/2023      Latest Ref Rng & Units 12/13/2023    8:04 AM 09/13/2023    8:02 AM 05/21/2023    8:02 AM  CMP  Glucose 70 - 99 mg/dL 161  096  045   BUN 8 - 27 mg/dL 15  17  17    Creatinine 0.76 - 1.27 mg/dL 4.09  8.11  9.14   Sodium 134 - 144 mmol/L 142  139  140  Potassium 3.5 - 5.2 mmol/L 4.0  3.9  3.9   Chloride 96 - 106 mmol/L 101  97  99   CO2 20 - 29 mmol/L 23  23  24    Calcium 8.6 - 10.2 mg/dL 9.4  9.3  9.2   Total Protein 6.0 - 8.5 g/dL 6.6  6.7    Total Bilirubin 0.0 - 1.2 mg/dL 0.9  0.8    Alkaline Phos 44 - 121 IU/L 66  71    AST 0 - 40 IU/L 18  16    ALT 0 - 44 IU/L 19  20     Lipid Panel     Component Value Date/Time   CHOL 144 12/13/2023 0804   TRIG 94 12/13/2023 0804   HDL 53 12/13/2023 0804   CHOLHDL 2.7 12/13/2023 0804   CHOLHDL 4.5 08/12/2019 0736   VLDL 63 (H) 02/13/2017 0720   LDLCALC 73 12/13/2023 0804   LDLCALC 115 (H) 08/12/2019 0736   LABVLDL 18 12/13/2023 0804    Notable Signs/Symptoms: Weak and shaky at times.   Lifestyle & Dietary Hx Lives with wife. Likes to fish.  Estimated daily fluid intake: 60 oz Supplements:  Sleep:  Stress / self-care:  Current average weekly physical activity: walks about 2 miles most days  24-Hr Dietary Recall First  Meal: Eggs and sausage patties. Snack: honeybun Second Meal: misc  Snack: nabs Third Meal: Meat and vegetables,  Snack: sometimes snacks Beverages: water-- use to drink sodas and tea  Estimated Energy Needs Calories: 1800 Carbohydrate: 200g Protein: 135g Fat: 50g   NUTRITION DIAGNOSIS  NB-1.1 Food and nutrition-related knowledge deficit As related to Diabetes Type 2.  As evidenced by A1C 10.3%.   NUTRITION INTERVENTION  Nutrition education (E-1) on the following topics:  Nutrition and Diabetes education provided on My Plate, CHO counting, meal planning, portion sizes, timing of meals, avoiding snacks between meals unless having a low blood sugar, target ranges for A1C and blood sugars, signs/symptoms and treatment of hyper/hypoglycemia, monitoring blood sugars, taking medications as prescribed, benefits of exercising 30 minutes per day and prevention of complications of DM.  Lifestyle Medicine  - Whole Food, Plant Predominant Nutrition is highly recommended: Eat Plenty of vegetables, Mushrooms, fruits, Legumes, Whole Grains, Nuts, seeds in lieu of processed meats, processed snacks/pastries red meat, poultry, eggs.    -It is better to avoid simple carbohydrates including: Cakes, Sweet Desserts, Ice Cream, Soda (diet and regular), Sweet Tea, Candies, Chips, Cookies, Store Bought Juices, Alcohol in Excess of  1-2 drinks a day, Lemonade,  Artificial Sweeteners, Doughnuts, Coffee Creamers, "Sugar-free" Products, etc, etc.  This is not a complete list.....  Exercise: If you are able: 30 -60 minutes a day ,4 days a week, or 150 minutes a week.  The longer the better.  Combine stretch, strength, and aerobic activities.  If you were told in the past that you have high risk for cardiovascular diseases, you may seek evaluation by your heart doctor prior to initiating moderate to intense exercise programs.   Handouts Provided Include  Lifestyle Medicine Handouts Know Your numbers. Glucose log  sheets  Learning Style & Readiness for Change Teaching method utilized: Visual & Auditory  Demonstrated degree of understanding via: Teach Back  Barriers to learning/adherence to lifestyle change: none  Goals Established by Pt Goals Eat three meals per day Check blood sugar twice a day Cut out sweets and processed foods Drink only water Let MD know if your blood sugars is below 70 -2-3 a  week Ask MD about not starting Glipizide since your blood sugars are much better since stopping sugary foods. Get A1C down to 7% or below.   MONITORING & EVALUATION Dietary intake, weekly physical activity, and blood sugars in 1 month.  Next Steps  Patient is to work on cutting out processed foods and eating foods that are whole plant based.Marland Kitchen

## 2023-12-19 ENCOUNTER — Telehealth: Payer: Self-pay

## 2023-12-19 NOTE — Telephone Encounter (Signed)
 Patient was identified as falling into the [place] Measure - Diabetes.   Patient was: Appointment already scheduled for:  02/13/24.

## 2024-01-16 ENCOUNTER — Encounter: Payer: Self-pay | Admitting: Nutrition

## 2024-01-16 ENCOUNTER — Encounter: Attending: Family Medicine | Admitting: Nutrition

## 2024-01-16 VITALS — Ht 72.0 in | Wt 274.0 lb

## 2024-01-16 DIAGNOSIS — I10 Essential (primary) hypertension: Secondary | ICD-10-CM

## 2024-01-16 DIAGNOSIS — E785 Hyperlipidemia, unspecified: Secondary | ICD-10-CM | POA: Diagnosis not present

## 2024-01-16 DIAGNOSIS — E66811 Obesity, class 1: Secondary | ICD-10-CM

## 2024-01-16 DIAGNOSIS — E1169 Type 2 diabetes mellitus with other specified complication: Secondary | ICD-10-CM

## 2024-01-16 NOTE — Progress Notes (Signed)
 Medical Nutrition Therapy 1129  End 1205  Primary concerns today: DM Type 2  Referral diagnosis: E11.8 Preferred learning style: No Preference  Learning readiness: Ready    NUTRITION ASSESSMENT  Follow up "I am walking 2 miles 4-5 times per week. I am feeling a lot better. I lost 2 good friends to a heart attack and stoke. I am working on taking better care of myself." Changes made:  Lost 4 lbs.  Is not on Ozempic  due to cost. Is on Metformin  and Glipizide  Walking 2 miles 4-5 times per week. Feels a lot better and has more energy. Eating more fruits, vegetables and whole grains. Cut out sweets, processed foods, junk food and fried foods. FBS;77-105 mg/dl.77-119 mg/dl. Blood sugars are drastically improved.  A1C was 10.3%. Est A1C now is probably close to a 6.5%. Will see Dr. Rodolph Clap in June for recheck of A1C.  May benefit from stopping Glipizide  due to low blood sugar with diet improvement and exercise.   Goals set previously. Eat three meals per day-done Check blood sugar twice a day done Cut out sweets and processed foods-done Drink only water -done Let MD know if your blood sugars is below 70 -2-3 a week- working  Ask MD about not starting Glipizide  since your blood sugars are much better since stopping sugary foods.-will discuss next visit. Get A1C down to 7% or below.- aiming for that.  Clinical Medical Hx:  Past Medical History:  Diagnosis Date   Chronic back pain    Chronic neck pain    Diabetes mellitus    Hyperlipidemia    Hypertension    Obesity    Sleep apnea    Not compliant with CPAP    Medications:  Current Outpatient Medications on File Prior to Visit  Medication Sig Dispense Refill   Alcohol  Swabs  PADS 1 each by Does not apply route as directed. 100 each 0   amLODipine  (NORVASC ) 10 MG tablet TAKE 1 TABLET EVERY DAY 90 tablet 3   Blood Glucose Monitoring Suppl (TRUE METRIX METER) w/Device KIT USE AS DIRECTED 1 kit 0   cloNIDine  (CATAPRES ) 0.3 MG tablet  TAKE 1 TABLET EVERY DAY 90 tablet 3   clotrimazole -betamethasone  (LOTRISONE ) cream Apply twice daily, as needed, to affected aresa 45 g 1   glipiZIDE  (GLUCOTROL  XL) 10 MG 24 hr tablet Take 1 tablet (10 mg total) by mouth daily with breakfast. 90 tablet 1   IBU 800 MG tablet Take 800 mg by mouth 2 (two) times daily.     losartan -hydrochlorothiazide  (HYZAAR) 100-25 MG tablet TAKE 1 TABLET EVERY DAY 90 tablet 3   metFORMIN  (GLUCOPHAGE ) 1000 MG tablet TAKE 1 TABLET TWICE DAILY WITH MEALS 180 tablet 3   potassium chloride  (KLOR-CON  M) 10 MEQ tablet TAKE 1 TABLET EVERY DAY 90 tablet 3   rosuvastatin  (CRESTOR ) 10 MG tablet TAKE 1 TABLET EVERY 30 tablet 11   Semaglutide ,0.25 or 0.5MG /DOS, 2 MG/3ML SOPN Inject 0.25 mg into the skin once a week. 3 mL 0   Semaglutide ,0.25 or 0.5MG /DOS, 2 MG/3ML SOPN Inject 0.5 mg into the skin once a week. 3 mL 1   TRUE METRIX BLOOD GLUCOSE TEST test strip TEST BLOOD SUGAR  UP  TO FOUR TIMES DAILY AS DIRECTED 100 strip 0   TRUEplus Lancets 33G MISC 1 each by Does not apply route as directed. 100 each 1   UNABLE TO FIND Med Name: TDAP VACCINE 1 each 0   No current facility-administered medications on file prior to visit.  Labs:  Lab Results  Component Value Date   HGBA1C 10.3 (H) 12/13/2023      Latest Ref Rng & Units 12/13/2023    8:04 AM 09/13/2023    8:02 AM 05/21/2023    8:02 AM  CMP  Glucose 70 - 99 mg/dL 409  811  914   BUN 8 - 27 mg/dL 15  17  17    Creatinine 0.76 - 1.27 mg/dL 7.82  9.56  2.13   Sodium 134 - 144 mmol/L 142  139  140   Potassium 3.5 - 5.2 mmol/L 4.0  3.9  3.9   Chloride 96 - 106 mmol/L 101  97  99   CO2 20 - 29 mmol/L 23  23  24    Calcium  8.6 - 10.2 mg/dL 9.4  9.3  9.2   Total Protein 6.0 - 8.5 g/dL 6.6  6.7    Total Bilirubin 0.0 - 1.2 mg/dL 0.9  0.8    Alkaline Phos 44 - 121 IU/L 66  71    AST 0 - 40 IU/L 18  16    ALT 0 - 44 IU/L 19  20     Lipid Panel     Component Value Date/Time   CHOL 144 12/13/2023 0804   TRIG 94  12/13/2023 0804   HDL 53 12/13/2023 0804   CHOLHDL 2.7 12/13/2023 0804   CHOLHDL 4.5 08/12/2019 0736   VLDL 63 (H) 02/13/2017 0720   LDLCALC 73 12/13/2023 0804   LDLCALC 115 (H) 08/12/2019 0736   LABVLDL 18 12/13/2023 0804    Notable Signs/Symptoms: Weak and shaky at times.   Lifestyle & Dietary Hx Lives with wife. Likes to fish.  Estimated daily fluid intake: 60 oz Supplements:  Sleep:  Stress / self-care:  Current average weekly physical activity: walks about 2 miles most days  24-Hr Dietary Recall First Meal: Eggs and Oatmeal or [employer]  toast and fruit Snack: Second Meal: Vegetables, fruit, water  Snack:  Third Meal: Baked fish, greens, cabbage, baked potato and water  Beverages: water -  Estimated Energy Needs Calories: 1800 Carbohydrate: 200g Protein: 135g Fat: 50g   NUTRITION DIAGNOSIS  NB-1.1 Food and nutrition-related knowledge deficit As related to Diabetes Type 2.  As evidenced by A1C 10.3%.   NUTRITION INTERVENTION  Nutrition education (E-1) on the following topics:  Nutrition and Diabetes education provided on My Plate, CHO counting, meal planning, portion sizes, timing of meals, avoiding snacks between meals unless having a low blood sugar, target ranges for A1C and blood sugars, signs/symptoms and treatment of hyper/hypoglycemia, monitoring blood sugars, taking medications as prescribed, benefits of exercising 30 minutes per day and prevention of complications of DM.  Lifestyle Medicine  - Whole Food, Plant Predominant Nutrition is highly recommended: Eat Plenty of vegetables, Mushrooms, fruits, Legumes, Whole Grains, Nuts, seeds in lieu of processed meats, processed snacks/pastries red meat, poultry, eggs.    -It is better to avoid simple carbohydrates including: Cakes, Sweet Desserts, Ice Cream, Soda (diet and regular), Sweet Tea, Candies, Chips, Cookies, Store Bought Juices, Alcohol  in Excess of  1-2 drinks a day, Lemonade,  Artificial Sweeteners, Doughnuts,  Coffee Creamers, "Sugar-free" Products, etc, etc.  This is not a complete list.....  Exercise: If you are able: 30 -60 minutes a day ,4 days a week, or 150 minutes a week.  The longer the better.  Combine stretch, strength, and aerobic activities.  If you were told in the past that you have high risk for cardiovascular diseases, you may seek evaluation by  your heart doctor prior to initiating moderate to intense exercise programs.   Handouts Provided Include  Lifestyle Medicine Handouts Know Your numbers. Glucose log sheets  Learning Style & Readiness for Change Teaching method utilized: Visual & Auditory  Demonstrated degree of understanding via: Teach Back  Barriers to learning/adherence to lifestyle change: none  Goals Established by Pt Keep up the great job. Keep walking 2 miles a day or add in some weight machines at the [employer] foods from garden and high fiber foods.   MONITORING & EVALUATION Dietary intake, weekly physical activity, and blood sugars in 4 month.  Next Steps  Patient is to work on cutting out processed foods and eating foods that are whole plant based.Dakota Mendez

## 2024-01-16 NOTE — Patient Instructions (Addendum)
 Keep up the great job. Keep walking 2 miles a day or add in some weight machines at the [employer] foods from garden and high fiber foods.

## 2024-02-13 ENCOUNTER — Encounter: Payer: Self-pay | Admitting: Family Medicine

## 2024-02-13 ENCOUNTER — Ambulatory Visit: Payer: Self-pay | Admitting: Family Medicine

## 2024-02-13 VITALS — BP 130/74 | HR 91 | Resp 16 | Ht 72.0 in | Wt 277.1 lb

## 2024-02-13 DIAGNOSIS — E785 Hyperlipidemia, unspecified: Secondary | ICD-10-CM

## 2024-02-13 DIAGNOSIS — I10 Essential (primary) hypertension: Secondary | ICD-10-CM

## 2024-02-13 DIAGNOSIS — Z23 Encounter for immunization: Secondary | ICD-10-CM | POA: Diagnosis not present

## 2024-02-13 DIAGNOSIS — Z125 Encounter for screening for malignant neoplasm of prostate: Secondary | ICD-10-CM | POA: Diagnosis not present

## 2024-02-13 DIAGNOSIS — E1169 Type 2 diabetes mellitus with other specified complication: Secondary | ICD-10-CM | POA: Diagnosis not present

## 2024-02-13 NOTE — Patient Instructions (Addendum)
 F/U end July, call if you need me sooner   Keep doing twice daily blood sugar checks and recording as you have been this helps  to keep you on track  Fasting blood sugar is great , between 80 to 120  Night time should be between 130 to 170. If your bedtime sugar is below 130 have a small snack like an orange  Non fasting HBA1C, chem 7 and EGFR and PSA 3 to 5 days before July appointment  Pls call for your appt for chest scan for screening for lung cancer  Pneumonia 20 in office today  Please get shingrix #2 and TdAP at your pharmacy over next several weeks, both are overdue  Thanks for choosing [facility], we consider it a privelige to serve you.

## 2024-02-14 DIAGNOSIS — Z23 Encounter for immunization: Secondary | ICD-10-CM | POA: Insufficient documentation

## 2024-02-14 DIAGNOSIS — Z125 Encounter for screening for malignant neoplasm of prostate: Secondary | ICD-10-CM | POA: Insufficient documentation

## 2024-02-14 NOTE — Assessment & Plan Note (Signed)
 After obtaining informed consent, the  Pneumonia 20 vaccine is  administered , with no adverse effect noted at the time of administration.

## 2024-02-14 NOTE — Assessment & Plan Note (Signed)
 PSA ordered

## 2024-02-14 NOTE — Assessment & Plan Note (Signed)
  Patient re-educated about  the importance of commitment to a  minimum of 150 minutes of exercise per week as able.  The importance of healthy food choices with portion control discussed, as well as eating regularly and within a 12 hour window most days. The need to choose "clean , green" food 50 to 75% of the time is discussed, as well as to make water  the primary drink and set a goal of 64 ounces water  daily.       02/13/2024    3:00 PM 01/16/2024   11:29 AM 12/17/2023    8:05 AM  Weight /BMI  Weight 277 lb 1.3 oz 274 lb 272 lb  Height 6' (1.829 m) 6' (1.829 m) 6' (1.829 m)  BMI 37.58 kg/m2 37.16 kg/m2 36.89 kg/m2

## 2024-02-14 NOTE — Assessment & Plan Note (Signed)
 Controlled, no change in medication DASH diet and commitment to daily physical activity for a minimum of 30 minutes discussed and encouraged, as a part of hypertension management. The importance of attaining a healthy weight is also discussed.     02/13/2024    3:26 PM 02/13/2024    3:00 PM 01/16/2024   11:29 AM 12/17/2023    8:05 AM 12/14/2023    8:54 AM 12/14/2023    8:19 AM 09/28/2023    8:48 AM  BP/Weight  Systolic BP 130 143   136 141 578  Diastolic BP 74 88   84 80 88  Wt. (Lbs)  277.08 274 272  278.04 277.8  BMI  37.58 kg/m2 37.16 kg/m2 36.89 kg/m2  37.71 kg/m2 37.68 kg/m2

## 2024-02-14 NOTE — Assessment & Plan Note (Signed)
 Diabetes associated with hypertension, hyperlipidemia, obesity, and arthritis  Mr. Speiser is reminded of the importance of commitment to daily physical activity for 30 minutes or more, as able and the need to limit carbohydrate intake to 30 to 60 grams per meal to help with blood sugar control.   The need to take medication as prescribed, test blood sugar as directed, and to call between visits if there is a concern that blood sugar is uncontrolled is also discussed.   Mr. Safley is reminded of the importance of daily foot exam, annual eye examination, and good blood sugar, blood pressure and cholesterol control.     Latest Ref Rng & Units 12/13/2023    8:04 AM 09/13/2023    8:02 AM 05/22/2023    9:18 AM 05/21/2023    8:02 AM 12/27/2022    8:08 AM  Diabetic Labs  HbA1c 4.8 - 5.6 % 10.3  8.5   7.7  6.8   Micro/Creat Ratio 0 - 29 mg/g creat   18     Chol 100 - 199 mg/dL 161  096    045   HDL >40 mg/dL 53  53    54   Calc LDL 0 - 99 mg/dL 73  74    72   Triglycerides 0 - 149 mg/dL 94  981    95   Creatinine 0.76 - 1.27 mg/dL 1.91  4.78   2.95  6.21       02/13/2024    3:26 PM 02/13/2024    3:00 PM 01/16/2024   11:29 AM 12/17/2023    8:05 AM 12/14/2023    8:54 AM 12/14/2023    8:19 AM 09/28/2023    8:48 AM  BP/Weight  Systolic BP 130 143   136 141 308  Diastolic BP 74 88   84 80 88  Wt. (Lbs)  277.08 274 272  278.04 277.8  BMI  37.58 kg/m2 37.16 kg/m2 36.89 kg/m2  37.71 kg/m2 37.68 kg/m2      05/23/2023    9:41 AM 08/08/2022   12:00 AM  Foot/eye exam completion dates  Eye Exam --     No Retinopathy         This result is from an external source.      Improved with closer attention to diet, continue current meds  Updated lab needed at/ before next visit.

## 2024-02-14 NOTE — Assessment & Plan Note (Signed)
  Patient re-educated about  the importance of commitment to a  minimum of 150 minutes of exercise per week as able.  The importance of healthy food choices with portion control discussed, as well as eating regularly and within a 12 hour window most days. The need to choose "clean , green" food 50 to 75% of the time is discussed, as well as to make water  the primary drink and set a goal of 64 ounces water  daily.       02/13/2024    3:00 PM 01/16/2024   11:29 AM 12/17/2023    8:05 AM  Weight /BMI  Weight 277 lb 1.3 oz 274 lb 272 lb  Height 6' (1.829 m) 6' (1.829 m) 6' (1.829 m)  BMI 37.58 kg/m2 37.16 kg/m2 36.89 kg/m2    deteriorated

## 2024-02-14 NOTE — Assessment & Plan Note (Signed)
 Hyperlipidemia:Low fat diet discussed and encouraged.   Lipid Panel  Lab Results  Component Value Date   CHOL 144 12/13/2023   HDL 53 12/13/2023   LDLCALC 73 12/13/2023   TRIG 94 12/13/2023   CHOLHDL 2.7 12/13/2023     Controlled, no change in medication

## 2024-02-14 NOTE — Progress Notes (Signed)
 Dakota Mendez     MRN: [id number]      DOB: May 16, 1961  Chief Complaint  Patient presents with   Diabetes    7 week follow up on blood sugar     HPI [name] is here for follow up and re-evaluation of chronic medical conditions, medication management and review of any available recent lab and radiology data.  Preventive health is updated, specifically  Cancer screening and Immunization.   Questions or concerns regarding consultations or procedures which the PT has had in the interim are  addressed. The PT denies any adverse reactions to current medications since the last visit.  FBG often in range, bedtime sugars are on the lower side and he is re educated re appropriate ranges with guideline for bedtime snack emphasized ROS Denies recent fever or chills. Denies sinus pressure, nasal congestion, ear pain or sore throat. Denies chest congestion, productive cough or wheezing. Denies chest pains, palpitations and leg swelling Denies abdominal pain, nausea, vomiting,diarrhea or constipation.   Denies dysuria, frequency, hesitancy or incontinence. Chronic joint pain, swelling and limitation in mobility. Denies headaches, seizures, numbness, or tingling. Denies depression, anxiety or insomnia. Denies skin break down or rash.   PE  BP 130/74   Pulse 91   Resp 16   Ht 6' (1.829 m)   Wt 277 lb 1.3 oz (125.7 kg)   SpO2 96%   BMI 37.58 kg/m   Patient alert and oriented and in no cardiopulmonary distress.  HEENT: No facial asymmetry, EOMI,     Neck supple .  Chest: Clear to auscultation bilaterally.  CVS: S1, S2 no murmurs, no S3.Regular rate.  ABD: Soft non tender.   Ext: No edema  WU:[id number]  ROM spine, shoulders, hips and knees.  Skin: Intact, no ulcerations or rash noted.  Psych: Good eye contact, normal affect. Memory intact not anxious or depressed appearing.  CNS: CN 2-12 intact, power,  normal throughout.no focal deficits noted.   Assessment &  Plan  Type 2 diabetes mellitus with other specified complication (HCC) Diabetes associated with hypertension, hyperlipidemia, obesity, and arthritis  [name] is reminded of the importance of commitment to daily physical activity for 30 minutes or more, as able and the need to limit carbohydrate intake to 30 to 60 grams per meal to help with blood sugar control.   The need to take medication as prescribed, test blood sugar as directed, and to call between visits if there is a concern that blood sugar is uncontrolled is also discussed.   [name] is reminded of the importance of daily foot exam, annual eye examination, and good blood sugar, blood pressure and cholesterol control.     Latest Ref Rng & Units 12/13/2023    8:04 AM 09/13/2023    8:02 AM 05/22/2023    9:18 AM 05/21/2023    8:02 AM 12/27/2022    8:08 AM  Diabetic Labs  HbA1c 4.8 - 5.6 % 10.3  8.5   7.7  6.8   Micro/Creat Ratio 0 - 29 mg/g creat   18     Chol 100 - 199 mg/dL 295  621    308   HDL >65 mg/dL 53  53    54   Calc LDL 0 - 99 mg/dL 73  74    72   Triglycerides 0 - 149 mg/dL 94  784    95   Creatinine 0.76 - 1.27 mg/dL 6.96  2.95   2.84  1.32  02/13/2024    3:26 PM 02/13/2024    3:00 PM 01/16/2024   11:29 AM 12/17/2023    8:05 AM 12/14/2023    8:54 AM 12/14/2023    8:19 AM 09/28/2023    8:48 AM  BP/Weight  Systolic BP 130 143   136 141 161  Diastolic BP 74 88   84 80 88  Wt. (Lbs)  277.08 274 272  278.04 277.8  BMI  37.58 kg/m2 37.16 kg/m2 36.89 kg/m2  37.71 kg/m2 37.68 kg/m2      05/23/2023    9:41 AM 08/08/2022   12:00 AM  Foot/eye exam completion dates  Eye Exam --     No Retinopathy         This result is from an external source.      Improved with closer attention to diet, continue current meds  Updated lab needed at/ before next visit.   Obesity (BMI 30.0-34.9)  Patient re-educated about  the importance of commitment to a  minimum of 150 minutes of exercise per week as able.  The  importance of healthy food choices with portion control discussed, as well as eating regularly and within a 12 hour window most days. The need to choose "clean , green" food 50 to 75% of the time is discussed, as well as to make water  the primary drink and set a goal of 64 ounces water  daily.       02/13/2024    3:00 PM 01/16/2024   11:29 AM 12/17/2023    8:05 AM  Weight /BMI  Weight 277 lb 1.3 oz 274 lb 272 lb  Height 6' (1.829 m) 6' (1.829 m) 6' (1.829 m)  BMI 37.58 kg/m2 37.16 kg/m2 36.89 kg/m2      Morbid obesity (HCC)  Patient re-educated about  the importance of commitment to a  minimum of 150 minutes of exercise per week as able.  The importance of healthy food choices with portion control discussed, as well as eating regularly and within a 12 hour window most days. The need to choose "clean , green" food 50 to 75% of the time is discussed, as well as to make water  the primary drink and set a goal of 64 ounces water  daily.       02/13/2024    3:00 PM 01/16/2024   11:29 AM 12/17/2023    8:05 AM  Weight /BMI  Weight 277 lb 1.3 oz 274 lb 272 lb  Height 6' (1.829 m) 6' (1.829 m) 6' (1.829 m)  BMI 37.58 kg/m2 37.16 kg/m2 36.89 kg/m2    deteriorated  Essential hypertension Controlled, no change in medication DASH diet and commitment to daily physical activity for a minimum of 30 minutes discussed and encouraged, as a part of hypertension management. The importance of attaining a healthy weight is also discussed.     02/13/2024    3:26 PM 02/13/2024    3:00 PM 01/16/2024   11:29 AM 12/17/2023    8:05 AM 12/14/2023    8:54 AM 12/14/2023    8:19 AM 09/28/2023    8:48 AM  BP/Weight  Systolic BP 130 143   136 141 096  Diastolic BP 74 88   84 80 88  Wt. (Lbs)  277.08 274 272  278.04 277.8  BMI  37.58 kg/m2 37.16 kg/m2 36.89 kg/m2  37.71 kg/m2 37.68 kg/m2       Encounter for immunization After obtaining informed consent, the Pneumonia 20 vaccine is  administered , with no adverse  effect noted at the time of administration.   Hyperlipidemia LDL goal <100 Hyperlipidemia:Low fat diet discussed and encouraged.   Lipid Panel  Lab Results  Component Value Date   CHOL 144 12/13/2023   HDL 53 12/13/2023   LDLCALC 73 12/13/2023   TRIG 94 12/13/2023   CHOLHDL 2.7 12/13/2023     Controlled, no change in medication   Special screening for malignant neoplasm of prostate PSA ordered

## 2024-03-12 ENCOUNTER — Ambulatory Visit (HOSPITAL_COMMUNITY)
Admission: RE | Admit: 2024-03-12 | Discharge: 2024-03-12 | Disposition: A | Discharge status: Home / Self Care | Source: Ambulatory Visit | Attending: Physician Assistant | Admitting: Physician Assistant

## 2024-03-12 DIAGNOSIS — Z122 Encounter for screening for malignant neoplasm of respiratory organs: Secondary | ICD-10-CM | POA: Diagnosis not present

## 2024-03-12 DIAGNOSIS — Z87891 Personal history of nicotine dependence: Secondary | ICD-10-CM | POA: Diagnosis not present

## 2024-03-21 ENCOUNTER — Inpatient Hospital Stay: Payer: Medicare HMO | Attending: Physician Assistant

## 2024-03-21 DIAGNOSIS — R700 Elevated erythrocyte sedimentation rate: Secondary | ICD-10-CM | POA: Insufficient documentation

## 2024-03-21 DIAGNOSIS — D649 Anemia, unspecified: Secondary | ICD-10-CM | POA: Insufficient documentation

## 2024-03-21 DIAGNOSIS — D72829 Elevated white blood cell count, unspecified: Secondary | ICD-10-CM

## 2024-03-21 DIAGNOSIS — D72820 Lymphocytosis (symptomatic): Secondary | ICD-10-CM | POA: Diagnosis not present

## 2024-03-21 DIAGNOSIS — Z87891 Personal history of nicotine dependence: Secondary | ICD-10-CM | POA: Insufficient documentation

## 2024-03-21 LAB — CBC WITH DIFFERENTIAL/PLATELET
Abs Immature Granulocytes: 0.03 K/uL (ref 0.00–0.07)
Basophils Absolute: 0.1 K/uL (ref 0.0–0.1)
Basophils Relative: 1 %
Eosinophils Absolute: 0.2 K/uL (ref 0.0–0.5)
Eosinophils Relative: 2 %
HCT: 40.4 % (ref 39.0–52.0)
Hemoglobin: 12.8 g/dL — ABNORMAL LOW (ref 13.0–17.0)
Immature Granulocytes: 0 %
Lymphocytes Relative: 28 %
Lymphs Abs: 2.7 K/uL (ref 0.7–4.0)
MCH: 27.4 pg (ref 26.0–34.0)
MCHC: 31.7 g/dL (ref 30.0–36.0)
MCV: 86.5 fL (ref 80.0–100.0)
Monocytes Absolute: 0.7 K/uL (ref 0.1–1.0)
Monocytes Relative: 7 %
Neutro Abs: 5.7 K/uL (ref 1.7–7.7)
Neutrophils Relative %: 62 %
Platelets: 341 K/uL (ref 150–400)
RBC: 4.67 MIL/uL (ref 4.22–5.81)
RDW: 14.1 % (ref 11.5–15.5)
WBC: 9.3 K/uL (ref 4.0–10.5)
nRBC: 0 % (ref 0.0–0.2)

## 2024-03-21 LAB — FOLATE: Folate: 11.9 ng/mL (ref >5.9)

## 2024-03-21 LAB — VITAMIN B12: Vitamin B-12: 148 pg/mL — ABNORMAL LOW (ref 180–914)

## 2024-03-21 LAB — FERRITIN: Ferritin: 10 ng/mL — ABNORMAL LOW (ref 24–336)

## 2024-03-21 LAB — IRON AND TIBC
Iron: 42 ug/dL — ABNORMAL LOW (ref 45–182)
Saturation Ratios: 10 % — ABNORMAL LOW (ref 17.9–39.5)
TIBC: 425 ug/dL (ref 250–450)
UIBC: 383 ug/dL

## 2024-03-24 ENCOUNTER — Encounter: Payer: Self-pay | Admitting: *Deleted

## 2024-03-24 ENCOUNTER — Other Ambulatory Visit: Payer: Self-pay | Admitting: *Deleted

## 2024-03-24 DIAGNOSIS — Z122 Encounter for screening for malignant neoplasm of respiratory organs: Secondary | ICD-10-CM

## 2024-03-24 DIAGNOSIS — Z87891 Personal history of nicotine dependence: Secondary | ICD-10-CM

## 2024-03-24 LAB — COPPER, SERUM: Copper: 111 ug/dL (ref 69–132)

## 2024-03-24 NOTE — Progress Notes (Signed)
 Patient in clinic today to get his results.  He received them via MyChart and didn't understand them.  I explained to him the presence of lung nodules on previous scans that had resolved or were stable.  I explained that there was a new nodule that had been picked up on the scan that they wanted to do a short interval check up on in 6 months.  At this time, nothing to be worried about but just need to check sooner than a year.  He verbalized understanding and appreciation of me taking the time to explain his results to him.  A copy of his results were given to him at this time.  We will mail him a copy of his appts following his office visit this week with Dakota Hope, NP.  He is okay with that.

## 2024-03-25 LAB — METHYLMALONIC ACID, SERUM: Methylmalonic Acid, Quantitative: 252 nmol/L (ref 0–378)

## 2024-03-27 ENCOUNTER — Inpatient Hospital Stay: Payer: Medicare HMO | Admitting: Oncology

## 2024-03-27 DIAGNOSIS — D509 Iron deficiency anemia, unspecified: Secondary | ICD-10-CM | POA: Diagnosis not present

## 2024-03-27 DIAGNOSIS — E538 Deficiency of other specified B group vitamins: Secondary | ICD-10-CM | POA: Diagnosis not present

## 2024-03-27 MED ORDER — FERROUS GLUCONATE 324 (38 FE) MG PO TABS: 324.0000 mg | ORAL_TABLET | Freq: Every day | ORAL | 2 refills | Status: AC

## 2024-03-27 MED ORDER — VITAMIN B-12 1000 MCG PO TABS: 1000.0000 ug | ORAL_TABLET | Freq: Every day | ORAL | 2 refills | Status: AC

## 2024-03-27 NOTE — Progress Notes (Signed)
 [facility] [address], [place] [address]   CLINIC:  Medical Oncology/Hematology  PCP:  Dakota Rollene BRAVO, MD [address] [place] [address] [phone]   REASON FOR VISIT:  Follow-up for leukocytosis  PRIOR THERAPY: None  CURRENT THERAPY: Under workup  I connected with Dakota Mendez on 03/27/24 at 10:00 AM EDT by telephone visit and verified that I am speaking with the correct person using two identifiers.   I discussed the limitations, risks, security and privacy concerns of performing an evaluation and management service by telemedicine and the availability of in-person appointments. I also discussed with the patient that there may be a patient responsible charge related to this service. The patient expressed understanding and agreed to proceed.   Other persons participating in the visit and their role in the encounter: NP, Patient    Patient's location: Home  Provider's location: Clinic    INTERVAL HISTORY:   Dakota Mendez 63 y.o. male returns for routine follow-up of leukocytosis.  He was seen in clinic by me on 09/28/2023.  In the interim, he denies any hospitalizations, surgeries or changes to his baseline health.  He denies any bleeding, melena or hematochezia.  Reports normal bowel movements.  He was found to have slight anemia at his last visit so nutritional labs were added on and he is here to review the results.  Reports he quit smoking over a year ago.  Appetite and energy levels are 100%.  ASSESSMENT & PLAN:  1.  Leukocytosis, predominantly neutrophilic - Mildly elevated WBC since 2009, ranging from 11,000-13,900, predominantly neutrophils but with occasional lymphocytosis - He denies any recurrent infections.  No steroid medications.  No prior splenectomy.  No known history of inflammatory/autoimmune disease. - Denies any fevers or night sweats. Lost about 15 pounds in the last 6 months but was trying to lose  weight with exercise and diet control.  - Current active smoker, approximately 1 PPD x 40 years - Hematology workup (08/25/2022): BCR ABL FISH negative Negative JAK2, CALR, MPL Normal CRP.  Mildly elevated ESR 19.  LDH normal. - Most recent CBC (09/13/23) show white count of 8.7 (8.2).  -MPN workup was negative.  Suspect reactive leukocytosis. -She has not had an elevated white count in over a year. -No additional workup needed.  2.  Tobacco use - Smoked 1 pack/day for 40 years.  Quit 1 year ago. - Low-dose CT scan on 07/27/2022 was lung RADS 2S -He is scheduled for his next CT chest on 11/01/2023.. -Will call with results.  3.   Social/family history: - PMH: Chronic neck/back pain, diabetes, hyperlipidemia, hypertension, obesity, sleep apnea - Lives with wife at home.  Independent of ADLs and IADLs.  He is retired [employer] architect.  Current active smoker, approximately 1 pack/day for 40 years. - No family history of leukemia or malignancies.  4.  Anemia: -Found incidentally on recent lab draw. -Labs from 09/28/2023 show hemoglobin of 12.8.  No other low hemoglobin over the last 15 years. -He denies any bleeding.  He had a colonoscopy on 08/08/2017 which revealed 2 small polyps and external and internal hemorrhoids.  Pathology revealed hyperplastic and recommended 10-year follow-up. -Nutritional workup from 03/21/2024 shows iron saturation is 10%, ferritin 10, folate 11.9, copper  level 111, vitamin B12 148 and hemoglobin 12.8. -We discussed starting oral iron and B12 supplements.  I would recommend 324 mg ferrous gluconate  and 1000 mcg B12 daily.  We will recheck his lab  work in 6 months.  We discussed an iron rich and B12 rich diet.  PLAN SUMMARY: >> Start 324 mg ferrous gluconate  and 1000 mcg vitamin B12 supplements daily. >> Recheck labs in 3 months   REVIEW OF SYSTEMS: Patient denies any acute complaints at today's visit  Review of Systems  Constitutional:  Positive for fatigue.   Musculoskeletal:  Positive for arthralgias.     PHYSICAL EXAM:  ECOG PERFORMANCE STATUS: 0 - Asymptomatic  There were no vitals filed for this visit.  There were no vitals filed for this visit.  Physical Exam Neurological:     Mental Status: He is alert and oriented to person, place, and time.     PAST MEDICAL/SURGICAL HISTORY:  Past Medical History:  Diagnosis Date   Chronic back pain    Chronic neck pain    Diabetes mellitus    Hyperlipidemia    Hypertension    Obesity    Sleep apnea    Not compliant with CPAP   Past Surgical History:  Procedure Laterality Date   CERVICAL SPINE SURGERY     COLONOSCOPY N/A 08/08/2017   Procedure: COLONOSCOPY;  Surgeon: Golda Claudis PENNER, MD;  Location: [facility];  Service: Endoscopy;  Laterality: N/A;  930   SPINE SURGERY  09/21/2011   cervial spine    SOCIAL HISTORY:  Social History   Socioeconomic History   Marital status: Married    Spouse name: Not on file   Number of children: 4   Years of education: Not on file   Highest education level: 9th grade  Occupational History   Occupation: disabled-truck dirver  Tobacco Use   Smoking status: Former    Current packs/day: 0.00    Average packs/day: 0.5 packs/day for 40.0 years (20.0 ttl pk-yrs)    Types: Cigarettes    Start date: 09/11/1982    Quit date: 09/11/2022    Years since quitting: 1.5   Smokeless tobacco: Never  Vaping Use   Vaping status: Never Used  Substance and Sexual Activity   Alcohol  use: No   Drug use: No   Sexual activity: Yes  Other Topics Concern   Not on file  Social History Narrative   Not on file   Social Drivers of Health   Financial Resource Strain: Low Risk  (06/06/2023)   Overall Financial Resource Strain (CARDIA)    Difficulty of Paying Living Expenses: Not hard at all  Food Insecurity: No Food Insecurity (06/06/2023)   Hunger Vital Sign    Worried About Running Out of Food in the Last Year: Never true    Ran Out of Food in the Last  Year: Never true  Transportation Needs: No Transportation Needs (06/06/2023)   PRAPARE - Administrator, Civil Service (Medical): No    Lack of Transportation (Non-Medical): No  Physical Activity: Sufficiently Active (06/06/2023)   Exercise Vital Sign    Days of Exercise per Week: 5 days    Minutes of Exercise per Session: 30 min  Stress: No Stress Concern Present (06/06/2023)   [employer] of Occupational Health - Occupational Stress Questionnaire    Feeling of Stress : Not at all  Social Connections: Socially Integrated (06/06/2023)   Social Connection and Isolation Panel    Frequency of Communication with Friends and Family: More than three times a week    Frequency of Social Gatherings with Friends and Family: More than three times a week    Attends Religious Services: More than 4 times  per year    Active Member of Clubs or Organizations: Yes    Attends Banker Meetings: More than 4 times per year    Marital Status: Married  Catering manager Violence: Not At Risk (06/06/2023)   Humiliation, Afraid, Rape, and Kick questionnaire    Fear of Current or Ex-Partner: No    Emotionally Abused: No    Physically Abused: No    Sexually Abused: No    FAMILY HISTORY:  Family History  Problem Relation Age of Onset   Cirrhosis Father    Heart attack Mother    Depression Mother    Stroke Mother    Hearing loss Brother    Cirrhosis Brother    Diabetes Sister    Sleep apnea Son    Hypertension Son     CURRENT MEDICATIONS:  Outpatient Encounter Medications as of 03/27/2024  Medication Sig   Alcohol  Swabs  PADS 1 each by Does not apply route as directed.   amLODipine  (NORVASC ) 10 MG tablet TAKE 1 TABLET EVERY DAY   Blood Glucose Monitoring Suppl (TRUE METRIX METER) w/Device KIT USE AS DIRECTED   cloNIDine  (CATAPRES ) 0.3 MG tablet TAKE 1 TABLET EVERY DAY   clotrimazole -betamethasone  (LOTRISONE ) cream Apply twice daily, as needed, to affected aresa    cyanocobalamin (VITAMIN B12) 1000 MCG tablet Take 1 tablet (1,000 mcg total) by mouth daily.   ferrous gluconate  (FERGON) 324 MG tablet Take 1 tablet (324 mg total) by mouth daily with breakfast.   glipiZIDE  (GLUCOTROL  XL) 10 MG 24 hr tablet Take 1 tablet (10 mg total) by mouth daily with breakfast.   IBU 800 MG tablet Take 800 mg by mouth 2 (two) times daily.   losartan -hydrochlorothiazide  (HYZAAR) 100-25 MG tablet TAKE 1 TABLET EVERY DAY   metFORMIN  (GLUCOPHAGE ) 1000 MG tablet TAKE 1 TABLET TWICE DAILY WITH MEALS   potassium chloride  (KLOR-CON  M) 10 MEQ tablet TAKE 1 TABLET EVERY DAY   rosuvastatin  (CRESTOR ) 10 MG tablet TAKE 1 TABLET EVERY   TRUE METRIX BLOOD GLUCOSE TEST test strip TEST BLOOD SUGAR  UP  TO FOUR TIMES DAILY AS DIRECTED   TRUEplus Lancets 33G MISC 1 each by Does not apply route as directed.   UNABLE TO FIND Med Name: TDAP VACCINE   No facility-administered encounter medications on file as of 03/27/2024.    ALLERGIES:  Allergies  Allergen Reactions   Jardiance  [Empagliflozin ] Other (See Comments)    Redness and irritation of genitals    LABORATORY DATA:  I have reviewed the labs as listed.  CBC    Component Value Date/Time   WBC 9.3 03/21/2024 0755   RBC 4.67 03/21/2024 0755   HGB 12.8 (L) 03/21/2024 0755   HGB 13.5 09/13/2023 0802   HCT 40.4 03/21/2024 0755   HCT 41.5 09/13/2023 0802   PLT 341 03/21/2024 0755   PLT 337 09/13/2023 0802   MCV 86.5 03/21/2024 0755   MCV 87 09/13/2023 0802   MCV 87 09/18/2011 0834   MCH 27.4 03/21/2024 0755   MCHC 31.7 03/21/2024 0755   RDW 14.1 03/21/2024 0755   RDW 12.9 09/13/2023 0802   RDW 13.5 09/18/2011 0834   LYMPHSABS 2.7 03/21/2024 0755   MONOABS 0.7 03/21/2024 0755   EOSABS 0.2 03/21/2024 0755   BASOSABS 0.1 03/21/2024 0755      Latest Ref Rng & Units 12/13/2023    8:04 AM 09/13/2023    8:02 AM 05/21/2023    8:02 AM  CMP  Glucose 70 - 99 mg/dL 809  193  211   BUN 8 - 27 mg/dL 15  17  17    Creatinine 0.76 -  1.27 mg/dL 9.02  9.00  8.92   Sodium 134 - 144 mmol/L 142  139  140   Potassium 3.5 - 5.2 mmol/L 4.0  3.9  3.9   Chloride 96 - 106 mmol/L 101  97  99   CO2 20 - 29 mmol/L 23  23  24    Calcium  8.6 - 10.2 mg/dL 9.4  9.3  9.2   Total Protein 6.0 - 8.5 g/dL 6.6  6.7    Total Bilirubin 0.0 - 1.2 mg/dL 0.9  0.8    Alkaline Phos 44 - 121 IU/L 66  71    AST 0 - 40 IU/L 18  16    ALT 0 - 44 IU/L 19  20      DIAGNOSTIC IMAGING:  I have independently reviewed the relevant imaging and discussed with the patient.   WRAP UP:  All questions were answered. The patient knows to call the clinic with any problems, questions or concerns.  Medical decision making: Low  Time spent on visit: I provided 12 minutes of non face-to-face telephone visit time during this encounter, and > 50% was spent counseling as documented under my assessment & plan.   Delon FORBES Hope, NP  09/25/2022 8:48 AM

## 2024-03-28 ENCOUNTER — Telehealth: Payer: Medicare HMO | Admitting: Oncology

## 2024-04-08 DIAGNOSIS — Z125 Encounter for screening for malignant neoplasm of prostate: Secondary | ICD-10-CM | POA: Diagnosis not present

## 2024-04-08 DIAGNOSIS — E1169 Type 2 diabetes mellitus with other specified complication: Secondary | ICD-10-CM | POA: Diagnosis not present

## 2024-04-08 DIAGNOSIS — I10 Essential (primary) hypertension: Secondary | ICD-10-CM | POA: Diagnosis not present

## 2024-04-09 ENCOUNTER — Encounter: Payer: Self-pay | Admitting: Family Medicine

## 2024-04-09 ENCOUNTER — Ambulatory Visit (INDEPENDENT_AMBULATORY_CARE_PROVIDER_SITE_OTHER): Admitting: Family Medicine

## 2024-04-09 ENCOUNTER — Encounter (INDEPENDENT_AMBULATORY_CARE_PROVIDER_SITE_OTHER): Payer: Self-pay | Admitting: *Deleted

## 2024-04-09 VITALS — BP 140/84 | HR 90 | Resp 16 | Ht 72.0 in | Wt 283.1 lb

## 2024-04-09 DIAGNOSIS — I10 Essential (primary) hypertension: Secondary | ICD-10-CM

## 2024-04-09 DIAGNOSIS — R9389 Abnormal findings on diagnostic imaging of other specified body structures: Secondary | ICD-10-CM | POA: Diagnosis not present

## 2024-04-09 DIAGNOSIS — D509 Iron deficiency anemia, unspecified: Secondary | ICD-10-CM | POA: Diagnosis not present

## 2024-04-09 DIAGNOSIS — E785 Hyperlipidemia, unspecified: Secondary | ICD-10-CM | POA: Diagnosis not present

## 2024-04-09 DIAGNOSIS — E1169 Type 2 diabetes mellitus with other specified complication: Secondary | ICD-10-CM | POA: Diagnosis not present

## 2024-04-09 LAB — HEMOGLOBIN A1C
Est. average glucose Bld gHb Est-mCnc: 137 mg/dL
Hgb A1c MFr Bld: 6.4 % — ABNORMAL HIGH (ref 4.8–5.6)

## 2024-04-09 LAB — BMP8+EGFR
BUN/Creatinine Ratio: 20 (ref 10–24)
BUN: 21 mg/dL (ref 8–27)
CO2: 19 mmol/L — ABNORMAL LOW (ref 20–29)
Calcium: 9.1 mg/dL (ref 8.6–10.2)
Chloride: 103 mmol/L (ref 96–106)
Creatinine, Ser: 1.06 mg/dL (ref 0.76–1.27)
Glucose: 131 mg/dL — ABNORMAL HIGH (ref 70–99)
Potassium: 4.4 mmol/L (ref 3.5–5.2)
Sodium: 142 mmol/L (ref 134–144)
eGFR: 80 mL/min/1.73 (ref >59)

## 2024-04-09 LAB — PSA: Prostate Specific Ag, Serum: 1.6 ng/mL (ref 0.0–4.0)

## 2024-04-09 NOTE — Assessment & Plan Note (Signed)
 Not at goal, lifestyle change only and re eval in 4 months DASH diet and commitment to daily physical activity for a minimum of 30 minutes discussed and encouraged, as a part of hypertension management. The importance of attaining a healthy weight is also discussed.     04/09/2024    8:31 AM 04/09/2024    8:04 AM 02/13/2024    3:26 PM 02/13/2024    3:00 PM 01/16/2024   11:29 AM 12/17/2023    8:05 AM 12/14/2023    8:54 AM  BP/Weight  Systolic BP 140 144 130 143   863  Diastolic BP 84 87 74 88   84  Wt. (Lbs)  283.08  277.08 274 272   BMI  38.39 kg/m2  37.58 kg/m2 37.16 kg/m2 36.89 kg/m2

## 2024-04-09 NOTE — Assessment & Plan Note (Signed)
 Rept scan in Jan 2026, pt aware

## 2024-04-09 NOTE — Assessment & Plan Note (Signed)
  Patient re-educated about  the importance of commitment to a  minimum of 150 minutes of exercise per week as able.  The importance of healthy food choices with portion control discussed, as well as eating regularly and within a 12 hour window most days. The need to choose clean , green food 50 to 75% of the time is discussed, as well as to make water  the primary drink and set a goal of 64 ounces water  daily.       04/09/2024    8:04 AM 02/13/2024    3:00 PM 01/16/2024   11:29 AM  Weight /BMI  Weight 283 lb 1.3 oz 277 lb 1.3 oz 274 lb  Height 6' (1.829 m) 6' (1.829 m) 6' (1.829 m)  BMI 38.39 kg/m2 37.58 kg/m2 37.16 kg/m2    Deterirorated will work on weight loss

## 2024-04-09 NOTE — Assessment & Plan Note (Signed)
 Diabetes associated with hypertension, hyperlipidemia, obesity, and arthritis  Dakota Mendez is reminded of the importance of commitment to daily physical activity for 30 minutes or more, as able and the need to limit carbohydrate intake to 30 to 60 grams per meal to help with blood sugar control.   The need to take medication as prescribed, test blood sugar as directed, and to call between visits if there is a concern that blood sugar is uncontrolled is also discussed.   Dakota Mendez is reminded of the importance of daily foot exam, annual eye examination, and good blood sugar, blood pressure and cholesterol control.     Latest Ref Rng & Units 04/08/2024    7:46 AM 12/13/2023    8:04 AM 09/13/2023    8:02 AM 05/22/2023    9:18 AM 05/21/2023    8:02 AM  Diabetic Labs  HbA1c 4.8 - 5.6 % 6.4  10.3  8.5   7.7   Micro/Creat Ratio 0 - 29 mg/g creat    18    Chol 100 - 199 mg/dL  855  844     HDL >60 mg/dL  53  53     Calc LDL 0 - 99 mg/dL  73  74     Triglycerides 0 - 149 mg/dL  94  831     Creatinine 0.76 - 1.27 mg/dL 8.93  9.02  9.00   8.92       04/09/2024    8:31 AM 04/09/2024    8:04 AM 02/13/2024    3:26 PM 02/13/2024    3:00 PM 01/16/2024   11:29 AM 12/17/2023    8:05 AM 12/14/2023    8:54 AM  BP/Weight  Systolic BP 140 144 130 143   863  Diastolic BP 84 87 74 88   84  Wt. (Lbs)  283.08  277.08 274 272   BMI  38.39 kg/m2  37.58 kg/m2 37.16 kg/m2 36.89 kg/m2       Latest Ref Rng & Units 08/08/2022   12:00 AM 07/22/2021    3:00 PM  Foot/eye exam completion dates  Eye Exam No Retinopathy No Retinopathy       Foot Form Completion   Done     This result is from an external source.   Marked improvemen , stop glipizide  and focus on food choice, exercise and weight lss, continue metformin  as before Call in 4 weeks if blod sugar uncontroled, test twice daily

## 2024-04-09 NOTE — Progress Notes (Signed)
 [name]     MRN: [id number]      DOB: [birth year]  Chief Complaint  Patient presents with   Diabetes    Follow up     HPI [name] is here for follow up and re-evaluation of chronic medical conditions, medication management and review of any available recent lab and radiology data.  Preventive health is updated, specifically  Cancer screening and Immunization.   Questions or concerns regarding consultations or procedures which the PT has had in the interim are  addressed. The PT denies any adverse reactions to current medications since the last visit. However states he has to eat something sweet at night more often than not so that his morning blood sugar does not fall too low Has not been as diligent with exercise  due to heat. No change in stool or abdominal pain reported  Abn lung scan for 6 month follow up noted  ROS Denies recent fever or chills. Denies sinus pressure, nasal congestion, ear pain or sore throat. Denies chest congestion, productive cough or wheezing. Denies chest pains, palpitations and leg swelling Denies abdominal pain, nausea, vomiting,diarrhea or constipation.   Denies dysuria, frequency, hesitancy or incontinence. Denies joint pain, swelling and limitation in mobility. Denies headaches, seizures, numbness, or tingling. Denies depression, anxiety or insomnia. Denies skin break down or rash.   PE  BP (!) 140/84   Pulse 90   Resp 16   Ht 6' (1.829 m)   Wt 283 lb 1.3 oz (128.4 kg)   SpO2 96%   BMI 38.39 kg/m   Patient alert and oriented and in no cardiopulmonary distress.  HEENT: No facial asymmetry, EOMI,     Neck supple .  Chest: Clear to auscultation bilaterally.  CVS: S1, S2 no murmurs, no S3.Regular rate.  ABD: Soft non tender.   Ext: No edema  MS: Adequate ROM spine, shoulders, hips and knees.  Skin: Intact, no ulcerations or rash noted.  Psych: Good eye contact, normal affect. Memory intact not anxious or depressed  appearing.  CNS: CN 2-12 intact, power,  normal throughout.no focal deficits noted.   Assessment & Plan  Essential hypertension Not at goal, lifestyle change only and re eval in 4 months DASH diet and commitment to daily physical activity for a minimum of 30 minutes discussed and encouraged, as a part of hypertension management. The importance of attaining a healthy weight is also discussed.     04/09/2024    8:31 AM 04/09/2024    8:04 AM 02/13/2024    3:26 PM 02/13/2024    3:00 PM 01/16/2024   11:29 AM 12/17/2023    8:05 AM 12/14/2023    8:54 AM  BP/Weight  Systolic BP 140 144 130 143   863  Diastolic BP 84 87 74 88   84  Wt. (Lbs)  283.08  277.08 274 272   BMI  38.39 kg/m2  37.58 kg/m2 37.16 kg/m2 36.89 kg/m2        Type 2 diabetes mellitus with other specified complication (HCC) Diabetes associated with hypertension, hyperlipidemia, obesity, and arthritis  [name] is reminded of the importance of commitment to daily physical activity for 30 minutes or more, as able and the need to limit carbohydrate intake to 30 to 60 grams per meal to help with blood sugar control.   The need to take medication as prescribed, test blood sugar as directed, and to call between visits if there is a concern that blood sugar is uncontrolled is  also discussed.   [name] is reminded of the importance of daily foot exam, annual eye examination, and good blood sugar, blood pressure and cholesterol control.     Latest Ref Rng & Units 04/08/2024    7:46 AM 12/13/2023    8:04 AM 09/13/2023    8:02 AM 05/22/2023    9:18 AM 05/21/2023    8:02 AM  Diabetic Labs  HbA1c 4.8 - 5.6 % 6.4  10.3  8.5   7.7   Micro/Creat Ratio 0 - 29 mg/g creat    18    Chol 100 - 199 mg/dL  855  844     HDL >60 mg/dL  53  53     Calc LDL 0 - 99 mg/dL  73  74     Triglycerides 0 - 149 mg/dL  94  831     Creatinine 0.76 - 1.27 mg/dL 8.93  9.02  9.00   8.92       04/09/2024    8:31 AM 04/09/2024    8:04 AM 02/13/2024     3:26 PM 02/13/2024    3:00 PM 01/16/2024   11:29 AM 12/17/2023    8:05 AM 12/14/2023    8:54 AM  BP/Weight  Systolic BP 140 144 130 143   863  Diastolic BP 84 87 74 88   84  Wt. (Lbs)  283.08  277.08 274 272   BMI  38.39 kg/m2  37.58 kg/m2 37.16 kg/m2 36.89 kg/m2       Latest Ref Rng & Units 08/08/2022   12:00 AM 07/22/2021    3:00 PM  Foot/eye exam completion dates  Eye Exam No Retinopathy No Retinopathy       Foot Form Completion   Done     This result is from an external source.   Marked improvemen , stop glipizide  and focus on food choice, exercise and weight lss, continue metformin  as before Call in 4 weeks if blod sugar uncontroled, test twice daily     Hyperlipidemia LDL goal <100 Hyperlipidemia:Low fat diet discussed and encouraged.   Lipid Panel  Lab Results  Component Value Date   CHOL 144 12/13/2023   HDL 53 12/13/2023   LDLCALC 73 12/13/2023   TRIG 94 12/13/2023   CHOLHDL 2.7 12/13/2023     Updated lab needed at/ before next visit.\   Morbid obesity (HCC)  Patient re-educated about  the importance of commitment to a  minimum of 150 minutes of exercise per week as able.  The importance of healthy food choices with portion control discussed, as well as eating regularly and within a 12 hour window most days. The need to choose clean , green food 50 to 75% of the time is discussed, as well as to make water  the primary drink and set a goal of 64 ounces water  daily.       04/09/2024    8:04 AM 02/13/2024    3:00 PM 01/16/2024   11:29 AM  Weight /BMI  Weight 283 lb 1.3 oz 277 lb 1.3 oz 274 lb  Height 6' (1.829 m) 6' (1.829 m) 6' (1.829 m)  BMI 38.39 kg/m2 37.58 kg/m2 37.16 kg/m2    Deterirorated will work on weight loss  IDA (iron deficiency anemia) Normal colonoscopy in 2018, denies Upper GI symptoms , no visible  blood loss , refer GI, take daily iron supplement  Abnormal CT scan, chest Rept scan in Jan 2026, pt aware

## 2024-04-09 NOTE — Assessment & Plan Note (Signed)
 Hyperlipidemia:Low fat diet discussed and encouraged.   Lipid Panel  Lab Results  Component Value Date   CHOL 144 12/13/2023   HDL 53 12/13/2023   LDLCALC 73 12/13/2023   TRIG 94 12/13/2023   CHOLHDL 2.7 12/13/2023     Updated lab needed at/ before next visit.\

## 2024-04-09 NOTE — Patient Instructions (Addendum)
 F/U  first week in November  Fasting lipid, cmp and eGFR , hBA1C 3 to 5 days before November appointment  STOP glipizide   Commit to daily exercise for at least 30 minutes, and also commit to increasing vegetable intake and reducing white starchy foods and sugar.  Work on weight loss.  Congratulations on excellent blood sugar.  Continue to test blood sugar twice daily.  Fasting blood sugar range should be 90-1 30 and bedtime blood sugar should be between 130 and 170.  Please contact me if your blood sugar is out of range in the next 4 weeks so I can make another medication adjustment.  At this time you are only on metformin  for your blood sugar.  Your blood pressure is slightly elevated outside of range, working on weight loss and regular exercise is the goal at this time I am not changing medications yet.  You are being referred to the gastroenterologist regarding iron deficiency anemia.  You need to start taking 1 iron tablet every day 325 mg which is over-the-counter.  Please ensure that you have your repeat lung scan in 6 months as has already been discussed.  Please do get your second Shingrix vaccine hopefully today and then plan on getting your tetanus shot in the next 2 to 4 weeks.  Thanks for choosing [facility], we consider it a privelige to serve you.

## 2024-04-09 NOTE — Assessment & Plan Note (Addendum)
 Normal colonoscopy in 2018, denies Upper GI symptoms , no visible  blood loss , refer GI, take daily iron supplement

## 2024-04-14 ENCOUNTER — Other Ambulatory Visit: Payer: Self-pay | Admitting: Family Medicine

## 2024-04-14 MED ORDER — TRUE METRIX BLOOD GLUCOSE TEST VI STRP
ORAL_STRIP | 0 refills | Status: DC
Start: 2024-04-14 — End: 2024-05-01

## 2024-04-14 NOTE — Telephone Encounter (Signed)
 Copied from CRM [id number]. Topic: Clinical - Medication Refill >> Apr 14, 2024  8:47 AM Gennette ORN wrote: Medication: TRUE METRIX BLOOD GLUCOSE TEST test strip  Has the patient contacted their pharmacy? Yes (Agent: If no, request that the patient contact the pharmacy for the refill. If patient does not wish to contact the pharmacy document the reason why and proceed with request.) (Agent: If yes, when and what did the pharmacy advise?)  This is the patient's preferred pharmacy:  [facility] [address], [place] - [address]  [address] [place] [address] Phone: [phone] Fax: [phone]   Is this the correct pharmacy for this prescription? Yes If no, delete pharmacy and type the correct one.   Has the prescription been filled recently? Yes  Is the patient out of the medication? Yes  Has the patient been seen for an appointment in the last year OR does the patient have an upcoming appointment? Yes  Can we respond through MyChart? Yes  Agent: Please be advised that Rx refills may take up to 3 business days. We ask that you follow-up with your pharmacy.

## 2024-04-17 ENCOUNTER — Other Ambulatory Visit: Payer: Self-pay

## 2024-04-17 MED ORDER — LANCET DEVICE MISC: 1.0000 | Freq: Three times a day (TID) | 0 refills | Status: AC

## 2024-04-17 MED ORDER — BLOOD GLUCOSE MONITORING SUPPL DEVI: 1.0000 | Freq: Three times a day (TID) | 0 refills | Status: AC

## 2024-04-17 MED ORDER — LANCETS MISC. MISC: 1.0000 | Freq: Three times a day (TID) | 0 refills | Status: AC

## 2024-04-17 MED ORDER — BLOOD GLUCOSE TEST VI STRP: 1.0000 | ORAL_STRIP | Freq: Three times a day (TID) | 0 refills | Status: AC

## 2024-04-21 ENCOUNTER — Other Ambulatory Visit: Payer: Self-pay | Admitting: Family Medicine

## 2024-04-21 ENCOUNTER — Encounter (INDEPENDENT_AMBULATORY_CARE_PROVIDER_SITE_OTHER): Payer: Self-pay | Admitting: Gastroenterology

## 2024-04-21 ENCOUNTER — Ambulatory Visit (INDEPENDENT_AMBULATORY_CARE_PROVIDER_SITE_OTHER): Admitting: Gastroenterology

## 2024-04-21 ENCOUNTER — Telehealth: Payer: Self-pay | Admitting: *Deleted

## 2024-04-21 VITALS — BP 131/85 | HR 80 | Temp 97.5°F | Ht 72.0 in | Wt 279.9 lb

## 2024-04-21 DIAGNOSIS — Z8711 Personal history of peptic ulcer disease: Secondary | ICD-10-CM

## 2024-04-21 DIAGNOSIS — Z6837 Body mass index (BMI) 37.0-37.9, adult: Secondary | ICD-10-CM | POA: Insufficient documentation

## 2024-04-21 DIAGNOSIS — K279 Peptic ulcer, site unspecified, unspecified as acute or chronic, without hemorrhage or perforation: Secondary | ICD-10-CM | POA: Insufficient documentation

## 2024-04-21 DIAGNOSIS — D500 Iron deficiency anemia secondary to blood loss (chronic): Secondary | ICD-10-CM

## 2024-04-21 DIAGNOSIS — D509 Iron deficiency anemia, unspecified: Secondary | ICD-10-CM | POA: Diagnosis not present

## 2024-04-21 DIAGNOSIS — E538 Deficiency of other specified B group vitamins: Secondary | ICD-10-CM | POA: Diagnosis not present

## 2024-04-21 MED ORDER — PANTOPRAZOLE SODIUM 40 MG PO TBEC: 40.0000 mg | DELAYED_RELEASE_TABLET | Freq: Every day | ORAL | 3 refills | Status: DC

## 2024-04-21 MED ORDER — PEG 3350-KCL-NA BICARB-NACL 420 G PO SOLR: 4000.0000 mL | Freq: Once | ORAL | 0 refills | Status: AC

## 2024-04-21 NOTE — Telephone Encounter (Signed)
 Copied from CRM #[id number]. Topic: Clinical - Medication Refill >> Apr 21, 2024  8:31 AM Carlatta H wrote: Medication:  Glucose Blood (BLOOD GLUCOSE TEST STRIPS) STRP    Has the patient contacted their pharmacy? No (Agent: If no, request that the patient contact the pharmacy for the refill. If patient does not wish to contact the pharmacy document the reason why and proceed with request.) (Agent: If yes, when and what did the pharmacy advise?)  This is the patient's preferred pharmacy:    [facility] - [place], [place] - [address] [place] [address] Phone: [phone] Fax: [phone]  Is this the correct pharmacy for this prescription? Yes If no, delete pharmacy and type the correct one.   Has the prescription been filled recently? No  Is the patient out of the medication? Yes  Has the patient been seen for an appointment in the last year OR does the patient have an upcoming appointment? Yes  Can we respond through MyChart? Yes  Agent: Please be advised that Rx refills may take up to 3 business days. We ask that you follow-up with your pharmacy.

## 2024-04-21 NOTE — Progress Notes (Signed)
 Lizandra Zakrzewski Faizan Sylver Vantassell , M.D. Gastroenterology & Hepatology [facility] Gastroenterology [address] [place], [place] [address] Primary Care Physician: Antonetta Rollene BRAVO, MD [address] [place] [address]  Chief Complaint: Iron deficiency anemia, history of peptic ulcer disease  History of Present Illness: Dakota Mendez is a 64 y.o. male hypertension hyperlipidemia, OSA who presents for evaluation of Iron deficiency anemia, history of peptic ulcer disease.  Patient reports occasionally he will have left upper quadrant pain which would be postprandial, this improves with belching.  Denies any NSAID use.  Patient reports many years ago he had a bleeding gastric ulcer and never had upper endoscopy after that .The patient denies having any nausea, vomiting, fever, chills, hematochezia, melena, hematemesis, abdominal distention, diarrhea, jaundice, pruritus or weight loss.  iron saturation is 10%, ferritin 10, folate 11.9, copper  level 111, vitamin B12 148 and hemoglobin 12.8.  Hemoglobin 12.8  Last EGD:10+ years ago ; for bleeding gastric ulcer  Cologuard test Negative 2023 Last Colonoscopy:2018 - One 6 mm polyp in the transverse colon, removed with a hot snare. Resected and retrieved. - One small polyp in the sigmoid colon. Biopsied. - External and internal hemorrhoids. 2 HP . Repeat 10 years  FHx: neg for any gastrointestinal/liver disease, no malignancies Social: Current smoker with Surgical: no abdominal surgeries  Past Medical History: Past Medical History:  Diagnosis Date   Chronic back pain    Chronic neck pain    Diabetes mellitus    Hyperlipidemia    Hypertension    Obesity    Sleep apnea    Not compliant with CPAP    Past Surgical History: Past Surgical History:  Procedure Laterality Date   CERVICAL SPINE SURGERY     COLONOSCOPY N/A 08/08/2017   Procedure: COLONOSCOPY;  Surgeon: Golda Claudis PENNER, MD;  [facility];  Service: Endoscopy;  Laterality: N/A;  930   SPINE SURGERY  09/21/2011   cervial spine    Family History: Family History  Problem Relation Age of Onset   Cirrhosis Father    Heart attack Mother    Depression Mother    Stroke Mother    Hearing loss Brother    Cirrhosis Brother    Diabetes Sister    Sleep apnea Son    Hypertension Son     Social History: Social History   Tobacco Use  Smoking Status Former   Current packs/day: 0.00   Average packs/day: 0.5 packs/day for 40.0 years (20.0 ttl pk-yrs)   Types: Cigarettes   Start date: 09/11/1982   Quit date: 09/11/2022   Years since quitting: 1.6  Smokeless Tobacco Never   Social History   Substance and Sexual Activity  Alcohol  Use No   Social History   Substance and Sexual Activity  Drug Use No    Allergies: Allergies  Allergen Reactions   Jardiance  [Empagliflozin ] Other (See Comments)    Redness and irritation of genitals    Medications: Current Outpatient Medications  Medication Sig Dispense Refill   Alcohol  Swabs  PADS 1 each by Does not apply route as directed. 100 each 0   amLODipine  (NORVASC ) 10 MG tablet TAKE 1 TABLET EVERY DAY 90 tablet 3   Blood Glucose Monitoring Suppl (TRUE METRIX METER) w/Device KIT USE AS DIRECTED 1 kit 0   Blood Glucose Monitoring Suppl DEVI 1 each by Does not apply route in the morning, at noon, and at bedtime. Patient insurance prefers One Touch. DX e11.65 1  each 0   cloNIDine  (CATAPRES ) 0.3 MG tablet TAKE 1 TABLET EVERY DAY 90 tablet 3   cyanocobalamin  (VITAMIN B12) 1000 MCG tablet Take 1 tablet (1,000 mcg total) by mouth daily. 90 tablet 2   ferrous gluconate  (FERGON) 324 MG tablet Take 1 tablet (324 mg total) by mouth daily with breakfast. 90 tablet 2   Glucose Blood (BLOOD GLUCOSE TEST STRIPS) STRP 1 each by In Vitro route in the morning, at noon, and at bedtime. Patient insurance prefers One Touch. DX e11.65 100 strip 0   glucose blood (TRUE METRIX BLOOD GLUCOSE  TEST) test strip Use as instructed 100 strip 0   IBU 800 MG tablet Take 800 mg by mouth 2 (two) times daily.     Lancet Device MISC 1 each by Does not apply route in the morning, at noon, and at bedtime. Patient insurance prefers One Touch. DX e11.65 1 each 0   Lancets Misc. MISC 1 each by Does not apply route in the morning, at noon, and at bedtime. Patient insurance prefers One Touch. DX e11.65 100 each 0   losartan -hydrochlorothiazide  (HYZAAR) 100-25 MG tablet TAKE 1 TABLET EVERY DAY 90 tablet 3   metFORMIN  (GLUCOPHAGE ) 1000 MG tablet TAKE 1 TABLET TWICE DAILY WITH MEALS 180 tablet 3   pantoprazole  (PROTONIX ) 40 MG tablet Take 1 tablet (40 mg total) by mouth daily. 60 tablet 3   potassium chloride  (KLOR-CON  M) 10 MEQ tablet TAKE 1 TABLET EVERY DAY 90 tablet 3   rosuvastatin  (CRESTOR ) 10 MG tablet TAKE 1 TABLET EVERY 30 tablet 11   TRUEplus Lancets 33G MISC 1 each by Does not apply route as directed. 100 each 1   No current facility-administered medications for this visit.    Review of Systems: GENERAL: negative for malaise, night sweats HEENT: No changes in hearing or vision, no nose bleeds or other nasal problems. NECK: Negative for lumps, goiter, pain and significant neck swelling RESPIRATORY: Negative for cough, wheezing CARDIOVASCULAR: Negative for chest pain, leg swelling, palpitations, orthopnea GI: SEE HPI MUSCULOSKELETAL: Negative for joint pain or swelling, back pain, and muscle pain. SKIN: Negative for lesions, rash HEMATOLOGY Negative for prolonged bleeding, bruising easily, and swollen nodes. ENDOCRINE: Negative for cold or heat intolerance, polyuria, polydipsia and goiter. NEURO: negative for tremor, gait imbalance, syncope and seizures. The remainder of the review of systems is noncontributory.   Physical Exam: BP 131/85   Pulse 80   Temp (!) 97.5 F (36.4 C)   Ht 6' (1.829 m)   Wt 279 lb 14.4 oz (127 kg)   BMI 37.96 kg/m  GENERAL: The patient is AO x3, in no  acute distress. HEENT: Head is normocephalic and atraumatic. EOMI are intact. Mouth is well hydrated and without lesions. NECK: Supple. No masses LUNGS: Clear to auscultation. No presence of rhonchi/wheezing/rales. Adequate chest expansion HEART: RRR, normal s1 and s2. ABDOMEN: Soft, nontender, no guarding, no peritoneal signs, and nondistended. BS +. No masses.  Imaging/Labs: as above     Latest Ref Rng & Units 03/21/2024    7:55 AM 09/28/2023    8:01 AM 09/13/2023    8:02 AM  CBC  WBC 4.0 - 10.5 K/uL 9.3  8.7  8.2   Hemoglobin 13.0 - 17.0 g/dL 87.1  87.1  86.4   Hematocrit 39.0 - 52.0 % 40.4  38.9  41.5   Platelets 150 - 400 K/uL 341  291  337    Lab Results  Component Value Date   IRON 42 (L)  03/21/2024   TIBC 425 03/21/2024   FERRITIN 10 (L) 03/21/2024    I personally reviewed and interpreted the available labs, imaging and endoscopic files.  Impression and Plan: AHKEEM GOEDE is a 63 y.o. male hypertension hyperlipidemia, OSA who presents for evaluation of Iron deficiency anemia, history of peptic ulcer disease.  # Iron deficiency anemia/vitamin B12 deficiency # Peptic ulcer disease  Recent labs with new onset iron deficiency anemia: iron saturation is 10%, ferritin 10, vitamin B12 148 and hemoglobin 12.8.   No overt bleeding  Patient does report history of bleeding gastric ulcers many years ago but never had a surveillance upper endoscopy after that.  As per ACG guidelines , bidirectional endoscopy is recommended over iron replacement therapy only.  We will proceed along with Upper endoscopy with small bowel biopsies and Colonoscopy   Will obtain gastric mapping biopsies during upper endoscopy to evaluate for pernicious anemia  Also recommending setting anti-intrinsic factor antibody and antiparietal antibody with next set of labs to evaluate for pernicious anemia  Will start Protonix  40 mg 30 minutes before breakfast  Recommend taking MiraLAX daily to prevent  constipation  If upper endoscopy and colonoscopy are negative depending on response to iron supplementation may pursue capsule endoscopy in future  All questions were answered.      Trevor Wilkie Faizan Dorothia Passmore, MD Gastroenterology and Hepatology [facility] Gastroenterology   This chart has been completed using [facility] Dictation software, and while attempts have been made to ensure accuracy , certain words and phrases may not be transcribed as intended  .

## 2024-04-21 NOTE — Telephone Encounter (Signed)
 Pt returned call. Scheduled for 9/15. Aware will send instructions to him and rx for prep to pharmacy.   PA approved via cohere for EGD/. NO PA REQUIRED FOR COLONOSCOPY Authorization #[id number]  DOS: 05/12/2024 - 06/11/2024

## 2024-04-21 NOTE — Telephone Encounter (Signed)
 LMOVM to call back to schedule TCS/EGD with Dr. Cinderella, any room, needs to hold iron x 7 days prior, no metformin  day of procedure

## 2024-04-21 NOTE — Patient Instructions (Signed)
 It was very nice to meet you today, as dicussed with will plan for the following :  1)Upper endoscopy and colonoscopy- stop iron pills 10 days prior to the procedure

## 2024-05-01 ENCOUNTER — Other Ambulatory Visit: Payer: Self-pay

## 2024-05-01 ENCOUNTER — Telehealth: Payer: Self-pay

## 2024-05-01 MED ORDER — TRUE METRIX BLOOD GLUCOSE TEST VI STRP: ORAL_STRIP | 0 refills | Status: DC

## 2024-05-01 NOTE — Telephone Encounter (Signed)
 Copied from CRM [id number]. Topic: Clinical - Medication Refill >> Apr 14, 2024  8:47 AM Gennette ORN wrote: Medication: TRUE METRIX BLOOD GLUCOSE TEST test strip  Has the patient contacted their pharmacy? Yes (Agent: If no, request that the patient contact the pharmacy for the refill. If patient does not wish to contact the pharmacy document the reason why and proceed with request.) (Agent: If yes, when and what did the pharmacy advise?)  This is the patient's preferred pharmacy:  [facility] [address], [place] - [address] [place] [address] Phone: [phone] Fax: [phone]   Is this the correct pharmacy for this prescription? Yes If no, delete pharmacy and type the correct one.   Has the prescription been filled recently? Yes  Is the patient out of the medication? Yes  Has the patient been seen for an appointment in the last year OR does the patient have an upcoming appointment? Yes  Can we respond through MyChart? Yes  Agent: Please be advised that Rx refills may take up to 3 business days. We ask that you follow-up with your pharmacy. >> Apr 30, 2024  3:56 PM Emylou G wrote: Patient called.. checkin status of strips?  Been since 8/4

## 2024-05-21 ENCOUNTER — Telehealth (INDEPENDENT_AMBULATORY_CARE_PROVIDER_SITE_OTHER): Payer: Self-pay | Admitting: Gastroenterology

## 2024-05-21 NOTE — Telephone Encounter (Signed)
 Spoke with Dr. Cinderella, okay to proceed. Asked if he had prep yet and he stated he was going to get it today or tomorrow.

## 2024-05-21 NOTE — Telephone Encounter (Signed)
 Patient came to front desk saying he is scheduled a colonoscopy with Dr Cinderella on 9/15 and was to hold his iron for 7 days. He took his iron yesterday and today. Please advise. [phone]

## 2024-05-26 ENCOUNTER — Ambulatory Visit (HOSPITAL_COMMUNITY): Payer: Self-pay | Admitting: Anesthesiology

## 2024-05-26 ENCOUNTER — Ambulatory Visit (HOSPITAL_COMMUNITY)
Admission: RE | Admit: 2024-05-26 | Discharge: 2024-05-26 | Disposition: A | Discharge status: Home / Self Care | Attending: Gastroenterology | Admitting: Gastroenterology

## 2024-05-26 ENCOUNTER — Other Ambulatory Visit: Payer: Self-pay

## 2024-05-26 ENCOUNTER — Ambulatory Visit (HOSPITAL_BASED_OUTPATIENT_CLINIC_OR_DEPARTMENT_OTHER): Payer: Self-pay | Admitting: Anesthesiology

## 2024-05-26 ENCOUNTER — Encounter (HOSPITAL_COMMUNITY)
Admission: RE | Disposition: A | Discharge status: Home / Self Care | Payer: Self-pay | Source: Home / Self Care | Attending: Gastroenterology

## 2024-05-26 ENCOUNTER — Encounter (HOSPITAL_COMMUNITY): Payer: Self-pay | Admitting: Gastroenterology

## 2024-05-26 DIAGNOSIS — K317 Polyp of stomach and duodenum: Secondary | ICD-10-CM | POA: Diagnosis not present

## 2024-05-26 DIAGNOSIS — I10 Essential (primary) hypertension: Secondary | ICD-10-CM | POA: Insufficient documentation

## 2024-05-26 DIAGNOSIS — K640 First degree hemorrhoids: Secondary | ICD-10-CM

## 2024-05-26 DIAGNOSIS — D509 Iron deficiency anemia, unspecified: Secondary | ICD-10-CM | POA: Insufficient documentation

## 2024-05-26 DIAGNOSIS — Z8711 Personal history of peptic ulcer disease: Secondary | ICD-10-CM | POA: Diagnosis not present

## 2024-05-26 DIAGNOSIS — K3189 Other diseases of stomach and duodenum: Secondary | ICD-10-CM | POA: Diagnosis not present

## 2024-05-26 DIAGNOSIS — K648 Other hemorrhoids: Secondary | ICD-10-CM

## 2024-05-26 DIAGNOSIS — E119 Type 2 diabetes mellitus without complications: Secondary | ICD-10-CM | POA: Insufficient documentation

## 2024-05-26 DIAGNOSIS — K31A15 Gastric intestinal metaplasia without dysplasia, involving multiple sites: Secondary | ICD-10-CM

## 2024-05-26 DIAGNOSIS — E785 Hyperlipidemia, unspecified: Secondary | ICD-10-CM | POA: Diagnosis not present

## 2024-05-26 DIAGNOSIS — Z7984 Long term (current) use of oral hypoglycemic drugs: Secondary | ICD-10-CM | POA: Diagnosis not present

## 2024-05-26 DIAGNOSIS — G709 Myoneural disorder, unspecified: Secondary | ICD-10-CM | POA: Diagnosis not present

## 2024-05-26 DIAGNOSIS — Z833 Family history of diabetes mellitus: Secondary | ICD-10-CM | POA: Insufficient documentation

## 2024-05-26 DIAGNOSIS — Z8249 Family history of ischemic heart disease and other diseases of the circulatory system: Secondary | ICD-10-CM | POA: Insufficient documentation

## 2024-05-26 DIAGNOSIS — K2950 Unspecified chronic gastritis without bleeding: Secondary | ICD-10-CM | POA: Diagnosis not present

## 2024-05-26 DIAGNOSIS — G4733 Obstructive sleep apnea (adult) (pediatric): Secondary | ICD-10-CM | POA: Insufficient documentation

## 2024-05-26 DIAGNOSIS — D519 Vitamin B12 deficiency anemia, unspecified: Secondary | ICD-10-CM | POA: Diagnosis not present

## 2024-05-26 DIAGNOSIS — K2970 Gastritis, unspecified, without bleeding: Secondary | ICD-10-CM | POA: Diagnosis not present

## 2024-05-26 DIAGNOSIS — K31A0 Gastric intestinal metaplasia, unspecified: Secondary | ICD-10-CM | POA: Insufficient documentation

## 2024-05-26 DIAGNOSIS — Z87891 Personal history of nicotine dependence: Secondary | ICD-10-CM | POA: Insufficient documentation

## 2024-05-26 DIAGNOSIS — G4730 Sleep apnea, unspecified: Secondary | ICD-10-CM | POA: Diagnosis not present

## 2024-05-26 HISTORY — PX: ESOPHAGOGASTRODUODENOSCOPY: SHX5428

## 2024-05-26 HISTORY — PX: COLONOSCOPY: SHX5424

## 2024-05-26 LAB — GLUCOSE, CAPILLARY: Glucose-Capillary: 146 mg/dL — ABNORMAL HIGH (ref 70–99)

## 2024-05-26 SURGERY — COLONOSCOPY
Anesthesia: General

## 2024-05-26 MED ORDER — LIDOCAINE 2% (20 MG/ML) 5 ML SYRINGE
INTRAMUSCULAR | Status: AC
  Filled 2024-05-26: qty 5

## 2024-05-26 MED ORDER — DEXMEDETOMIDINE HCL IN NACL 80 MCG/20ML IV SOLN
INTRAVENOUS | Status: AC
  Filled 2024-05-26: qty 20

## 2024-05-26 MED ORDER — GLYCOPYRROLATE PF 0.2 MG/ML IJ SOSY
PREFILLED_SYRINGE | INTRAMUSCULAR | Status: AC
  Filled 2024-05-26: qty 1

## 2024-05-26 MED ORDER — LIDOCAINE 2% (20 MG/ML) 5 ML SYRINGE
INTRAMUSCULAR | Status: DC | PRN
  Administered 2024-05-26: 100 mg via INTRAVENOUS

## 2024-05-26 MED ORDER — LACTATED RINGERS IV SOLN: INTRAVENOUS | Status: DC

## 2024-05-26 MED ORDER — EPHEDRINE 5 MG/ML INJ
INTRAVENOUS | Status: AC
  Filled 2024-05-26: qty 5

## 2024-05-26 MED ORDER — PROPOFOL 500 MG/50ML IV EMUL
INTRAVENOUS | Status: AC
  Filled 2024-05-26: qty 50

## 2024-05-26 MED ORDER — PROPOFOL 500 MG/50ML IV EMUL
INTRAVENOUS | Status: DC | PRN
  Administered 2024-05-26: 30 mg via INTRAVENOUS
  Administered 2024-05-26: 50 mg via INTRAVENOUS
  Administered 2024-05-26: 150 ug/kg/min via INTRAVENOUS

## 2024-05-26 MED ORDER — PHENYLEPHRINE HCL-NACL 20-0.9 MG/250ML-% IV SOLN
INTRAVENOUS | Status: DC | PRN
  Administered 2024-05-26: 80 ug via INTRAVENOUS

## 2024-05-26 MED ORDER — DEXMEDETOMIDINE HCL IN NACL 200 MCG/50ML IV SOLN
INTRAVENOUS | Status: DC | PRN
  Administered 2024-05-26: 8 ug via INTRAVENOUS

## 2024-05-26 NOTE — Anesthesia Postprocedure Evaluation (Signed)
 Anesthesia Post Note  Patient: Dakota Mendez  Procedure(s) Performed: COLONOSCOPY EGD (ESOPHAGOGASTRODUODENOSCOPY)  Patient location during evaluation: Phase II Anesthesia Type: General Level of consciousness: awake Pain management: pain level controlled Vital Signs Assessment: post-procedure vital signs reviewed and stable Respiratory status: spontaneous breathing and respiratory function stable Cardiovascular status: blood pressure returned to baseline and stable Postop Assessment: no headache and no apparent nausea or vomiting Anesthetic complications: no Comments: Late entry   There were no known notable events for this encounter.   Last Vitals:  Vitals:   05/26/24 0957 05/26/24 0959  BP: (!) 102/91 115/82  Pulse: 77   Resp: (!) 21   Temp:    SpO2: 98%     Last Pain:  Vitals:   05/26/24 0957  TempSrc:   PainSc: 0-No pain                 Yvonna JINNY Bosworth

## 2024-05-26 NOTE — Anesthesia Preprocedure Evaluation (Signed)
 Anesthesia Evaluation  Patient identified by MRN, date of birth, ID band Patient awake    Reviewed: Allergy & Precautions, H&P , NPO status , Patient's Chart, lab work & pertinent test results, reviewed documented beta blocker date and time   Airway Mallampati: II  TM Distance: >3 FB Neck ROM: full    Dental no notable dental hx.    Pulmonary sleep apnea , former smoker   Pulmonary exam normal breath sounds clear to auscultation       Cardiovascular Exercise Tolerance: Good hypertension,  Rhythm:regular Rate:Normal     Neuro/Psych  Neuromuscular disease  negative psych ROS   GI/Hepatic Neg liver ROS, PUD,,,  Endo/Other  diabetes    Renal/GU negative Renal ROS  negative genitourinary   Musculoskeletal   Abdominal   Peds  Hematology  (+) Blood dyscrasia, anemia   Anesthesia Other Findings   Reproductive/Obstetrics negative OB ROS                              Anesthesia Physical Anesthesia Plan  ASA: 3  Anesthesia Plan: General   Post-op Pain Management:    Induction:   PONV Risk Score and Plan: Propofol  infusion  Airway Management Planned:   Additional Equipment:   Intra-op Plan:   Post-operative Plan:   Informed Consent: I have reviewed the patients History and Physical, chart, labs and discussed the procedure including the risks, benefits and alternatives for the proposed anesthesia with the patient or authorized representative who has indicated his/her understanding and acceptance.     Dental Advisory Given  Plan Discussed with: CRNA  Anesthesia Plan Comments:         Anesthesia Quick Evaluation

## 2024-05-26 NOTE — H&P (Signed)
 Primary Care Physician:  Antonetta Rollene BRAVO, MD Primary Gastroenterologist:  Dr. Cinderella  Pre-Procedure History & Physical: HPI: KALIJAH ZEISS is a 63 y.o. male hypertension hyperlipidemia, OSA who presents for evaluation of Iron deficiency anemia, history of peptic ulcer disease.   Patient reports occasionally he will have left upper quadrant pain which would be postprandial, this improves with belching.  Denies any NSAID use.  Patient reports many years ago he had a bleeding gastric ulcer and never had upper endoscopy after that .The patient denies having any nausea, vomiting, fever, chills, hematochezia, melena, hematemesis, abdominal distention, diarrhea, jaundice, pruritus or weight loss.   iron saturation is 10%, ferritin 10, folate 11.9, copper  level 111, vitamin B12 148 and hemoglobin 12.8.  Hemoglobin 12.8   Last EGD:10+ years ago ; for bleeding gastric ulcer   Cologuard test Negative 2023 Last Colonoscopy:2018 - One 6 mm polyp in the transverse colon, removed with a hot snare. Resected and retrieved. - One small polyp in the sigmoid colon. Biopsied. - External and internal hemorrhoids. 2 HP . Repeat 10 years   FHx: neg for any gastrointestinal/liver disease, no malignancies Social: Current smoker with Surgical: no abdominal surgeries  Past Medical History:  Diagnosis Date   Chronic back pain    Chronic neck pain    Diabetes mellitus    Hyperlipidemia    Hypertension    Obesity    Sleep apnea    Not compliant with CPAP    Past Surgical History:  Procedure Laterality Date   CERVICAL SPINE SURGERY     COLONOSCOPY N/A 08/08/2017   Procedure: COLONOSCOPY;  Surgeon: Golda Claudis PENNER, MD;  [facility];  Service: Endoscopy;  Laterality: N/A;  930   SPINE SURGERY  09/21/2011   cervial spine    Prior to Admission medications   Medication Sig Start Date End Date Taking? Authorizing Provider  amLODipine  (NORVASC ) 10 MG tablet TAKE 1 TABLET EVERY DAY 08/16/23   Yes Antonetta Rollene BRAVO, MD  cloNIDine  (CATAPRES ) 0.3 MG tablet TAKE 1 TABLET EVERY DAY 08/16/23  Yes Antonetta Rollene BRAVO, MD  cyanocobalamin  (VITAMIN B12) 1000 MCG tablet Take 1 tablet (1,000 mcg total) by mouth daily. 03/27/24  Yes Burns, Delon BRAVO, NP  IBU 800 MG tablet Take 800 mg by mouth 2 (two) times daily. 08/14/22  Yes [provider]  losartan -hydrochlorothiazide  (HYZAAR) 100-25 MG tablet TAKE 1 TABLET EVERY DAY 08/16/23  Yes Antonetta Rollene BRAVO, MD  metFORMIN  (GLUCOPHAGE ) 1000 MG tablet TAKE 1 TABLET TWICE DAILY WITH MEALS 08/16/23  Yes Antonetta Rollene BRAVO, MD  pantoprazole  (PROTONIX ) 40 MG tablet Take 1 tablet (40 mg total) by mouth daily. 04/21/24  Yes Knowledge Escandon, Deatrice FALCON, MD  potassium chloride  (KLOR-CON  M) 10 MEQ tablet TAKE 1 TABLET EVERY DAY 11/14/23  Yes Antonetta Rollene BRAVO, MD  rosuvastatin  (CRESTOR ) 10 MG tablet TAKE 1 TABLET EVERY 10/01/23  Yes Debera Jayson MATSU, MD  Alcohol  Swabs  PADS 1 each by Does not apply route as directed. 07/22/20   Antonetta Rollene BRAVO, MD  Blood Glucose Monitoring Suppl (TRUE METRIX METER) w/Device KIT USE AS DIRECTED 11/30/20   Antonetta Rollene BRAVO, MD  Blood Glucose Monitoring Suppl DEVI 1 each by Does not apply route in the morning, at noon, and at bedtime. Patient insurance prefers One Touch. DX e11.65 04/17/24   Antonetta Rollene BRAVO, MD  ferrous gluconate  [facility]) 324 MG tablet Take 1 tablet (324 mg total) by mouth daily with breakfast. 03/27/24   Geofm Delon BRAVO, NP  glucose blood (TRUE METRIX BLOOD GLUCOSE TEST) test strip Use as instructed 05/01/24   Antonetta Rollene BRAVO, MD  TRUEplus Lancets 33G MISC 1 each by Does not apply route as directed. 07/27/20   Antonetta Rollene BRAVO, MD    Allergies as of 04/21/2024 - Review Complete 04/21/2024  Allergen Reaction Noted   Jardiance  [empagliflozin ] Other (See Comments) 11/30/2021    Family History  Problem Relation Age of Onset   Cirrhosis Father    Heart attack Mother    Depression Mother    Stroke  Mother    Hearing loss Brother    Cirrhosis Brother    Diabetes Sister    Sleep apnea Son    Hypertension Son     Social History   Socioeconomic History   Marital status: Married    Spouse name: Not on file   Number of children: 4   Years of education: Not on file   Highest education level: 9th grade  Occupational History   Occupation: disabled-truck dirver  Tobacco Use   Smoking status: Former    Current packs/day: 0.00    Average packs/day: 0.5 packs/day for 40.0 years (20.0 ttl pk-yrs)    Types: Cigarettes    Start date: 09/11/1982    Quit date: 09/11/2022    Years since quitting: 1.7   Smokeless tobacco: Never  Vaping Use   Vaping status: Never Used  Substance and Sexual Activity   Alcohol  use: No   Drug use: No   Sexual activity: Yes  Other Topics Concern   Not on file  Social History Narrative   Not on file   Social Drivers of Health   Financial Resource Strain: Low Risk  (06/06/2023)   Overall Financial Resource Strain (CARDIA)    Difficulty of Paying Living Expenses: Not hard at all  Food Insecurity: No Food Insecurity (06/06/2023)   Hunger Vital Sign    Worried About Running Out of Food in the Last Year: Never true    Ran Out of Food in the Last Year: Never true  Transportation Needs: No Transportation Needs (06/06/2023)   PRAPARE - Administrator, Civil Service (Medical): No    Lack of Transportation (Non-Medical): No  Physical Activity: Sufficiently Active (06/06/2023)   Exercise Vital Sign    Days of Exercise per Week: 5 days    Minutes of Exercise per Session: 30 min  Stress: No Stress Concern Present (06/06/2023)   [employer] of Occupational Health - Occupational Stress Questionnaire    Feeling of Stress : Not at all  Social Connections: Socially Integrated (06/06/2023)   Social Connection and Isolation Panel    Frequency of Communication with Friends and Family: More than three times a week    Frequency of Social Gatherings with  Friends and Family: More than three times a week    Attends Religious Services: More than 4 times per year    Active Member of [employer] or Organizations: Yes    Attends Engineer, structural: More than 4 times per year    Marital Status: Married  Catering manager Violence: Not At Risk (06/06/2023)   Humiliation, Afraid, Rape, and Kick questionnaire    Fear of Current or Ex-Partner: No    Emotionally Abused: No    Physically Abused: No    Sexually Abused: No    Review of Systems: See HPI, otherwise negative ROS  Physical Exam: Vital signs in last 24 hours: Temp:  [98.4 F (36.9 C)] 98.4 F (36.9 C) (  09/15 0805) Pulse Rate:  [75] 75 (09/15 0805) Resp:  [14] 14 (09/15 0805) BP: (138)/(86) 138/86 (09/15 0805) SpO2:  [100 %] 100 % (09/15 0805) Weight:  [122.9 kg] 122.9 kg (09/15 0805)   General:   Alert,  Well-developed, well-nourished, pleasant and cooperative in NAD Head:  Normocephalic and atraumatic. Eyes:  Sclera clear, no icterus.   Conjunctiva pink. Ears:  Normal auditory acuity. Nose:  No deformity, discharge,  or lesions. Msk:  Symmetrical without gross deformities. Normal posture. Extremities:  Without clubbing or edema. Neurologic:  Alert and  oriented x4;  grossly normal neurologically. Skin:  Intact without significant lesions or rashes. Psych:  Alert and cooperative. Normal mood and affect.  Impression/Plan: KARANVIR BALDERSTON is a 63 y.o. male hypertension hyperlipidemia, OSA who presents for evaluation of Iron deficiency anemia, history of peptic ulcer disease. Proceed with bidirectional endoscopy   The risks of the procedure including infection, bleed, or perforation as well as benefits, limitations, alternatives and imponderables have been reviewed with the patient. Questions have been answered. All parties agreeable.

## 2024-05-26 NOTE — Discharge Instructions (Signed)

## 2024-05-26 NOTE — Transfer of Care (Signed)
 Immediate Anesthesia Transfer of Care Note  Patient: Dakota Mendez  Procedure(s) Performed: COLONOSCOPY EGD (ESOPHAGOGASTRODUODENOSCOPY)  Patient Location: PACU  Anesthesia Type:MAC  Level of Consciousness: awake  Airway & Oxygen Therapy: Patient Spontanous Breathing  Post-op Assessment: Report given to RN and Post -op Vital signs reviewed and stable  Post vital signs: Reviewed  Last Vitals:  Vitals Value Taken Time  BP 95/71 0942  Temp 37.3 0942  Pulse 80 0942  Resp 12 0942  SpO2 97 0942    Last Pain:  Vitals:   05/26/24 0858  TempSrc:   PainSc: 0-No pain      Patients Stated Pain Goal: 8 (05/26/24 0805)  Complications: There were no known notable events for this encounter.

## 2024-05-26 NOTE — Op Note (Signed)
 [facility] Patient Name: Dakota Mendez Procedure Date: 05/26/2024 8:50 AM MRN: [id number] Date of Birth: [birth year] Attending MD: Deatrice Dine , MD, [id number] CSN: [id number] Age: 63 Admit Type: Outpatient Procedure:                Upper GI endoscopy Indications:              Unexplained iron deficiency anemia, Vitamin B12                            deficiency anemia Providers:                Deatrice Dine, MD, Devere Lodge, Bascom Blush Referring MD:              Medicines:                Monitored Anesthesia Care Complications:            No immediate complications. Estimated Blood Loss:     Estimated blood loss was minimal. Procedure:                Pre-Anesthesia Assessment:                           - Prior to the procedure, a History and Physical                            was performed, and patient medications and                            allergies were reviewed. The patient's tolerance of                            previous anesthesia was also reviewed. The risks                            and benefits of the procedure and the sedation                            options and risks were discussed with the patient.                            All questions were answered, and informed consent                            was obtained. Prior Anticoagulants: The patient has                            taken no anticoagulant or antiplatelet agents                            except for aspirin. ASA Grade Assessment: II - A                            patient with mild systemic disease. After reviewing  the risks and benefits, the patient was deemed in                            satisfactory condition to undergo the procedure.                           After obtaining informed consent, the endoscope was                            passed under direct vision. Throughout the                            procedure, the patient's blood pressure, pulse, and                             oxygen saturations were monitored continuously. The                            [id number] ([id number]) Upper was introduced through                            the mouth, and advanced to the second part of                            duodenum. The upper GI endoscopy was accomplished                            without difficulty. The patient tolerated the                            procedure well. Scope In: 9:05:12 AM Scope Out: 9:18:24 AM Total Procedure Duration: 0 hours 13 minutes 12 seconds  Findings:      The examined esophagus was normal.      Diffuse nodular mucosa was found in the entire examined stomach.       Biopsies were taken with a cold forceps for histology.      Diffuse mucosal changes characterized by polypoid lesions were found in       the entire examined stomach. The polyp was removed with a cold snare.       Resection and retrieval were complete.      The duodenal bulb and second portion of the duodenum were normal.       Biopsies were taken with a cold forceps for histology.      Diffuse moderate inflammation characterized by nodularity was found in       the entire examined stomach. Biopsies were taken with a cold forceps for       histology. Impression:               - Normal esophagus.                           - Nodular mucosa in the entire stomach. Biopsied.                           - Polypoid lesions mucosa in the stomach.  representative polypoid lesion was resected                           - Normal duodenal bulb and second portion of the                            duodenum. Biopsied.                           - Gastritis. Biopsied. Moderate Sedation:      Per Anesthesia Care Recommendation:           - Patient has a contact number available for                            emergencies. The signs and symptoms of potential                            delayed complications were discussed with the                             patient. Return to normal activities tomorrow.                            Written discharge instructions were provided to the                            patient.                           - Resume previous diet.                           - Continue present medications.                           - Await pathology results.                           - Repeat upper endoscopy for surveillance based on                            pathology results.                           - Return to GI clinic as previously scheduled.                           -Start Vitamin B12 supplementation                           -Check pernicious anemia panel                           -Follow up with hematology Procedure Code(s):        --- Professional ---  43251, Esophagogastroduodenoscopy, flexible,                            transoral; with removal of tumor(s), polyp(s), or                            other lesion(s) by snare technique                           43239, 59, Esophagogastroduodenoscopy, flexible,                            transoral; with biopsy, single or multiple Diagnosis Code(s):        --- Professional ---                           K31.89, Other diseases of stomach and duodenum                           K29.70, Gastritis, unspecified, without bleeding                           D50.9, Iron deficiency anemia, unspecified                           D51.9, Vitamin B12 deficiency anemia, unspecified CPT copyright 2022 [employer]. All rights reserved. The codes documented in this report are preliminary and upon coder review may  be revised to meet current compliance requirements. Deatrice Dine, MD Deatrice Dine, MD 05/26/2024 9:45:27 AM This report has been signed electronically. Number of Addenda: 0

## 2024-05-26 NOTE — Op Note (Signed)
 [facility] Patient Name: Dakota Mendez Procedure Date: 05/26/2024 8:49 AM MRN: [id number] Date of Birth: 03-13-1961 Attending MD: Deatrice Dine , MD, [id number] CSN: [id number] Age: 63 Admit Type: Outpatient Procedure:                Colonoscopy Indications:              Unexplained iron deficiency anemia Providers:                Deatrice Dine, MD, Devere Lodge, Bascom Blush Referring MD:              Medicines:                Monitored Anesthesia Care Complications:            No immediate complications. Estimated Blood Loss:     Estimated blood loss: none. Procedure:                Pre-Anesthesia Assessment:                           - Prior to the procedure, a History and Physical                            was performed, and patient medications and                            allergies were reviewed. The patient's tolerance of                            previous anesthesia was also reviewed. The risks                            and benefits of the procedure and the sedation                            options and risks were discussed with the patient.                            All questions were answered, and informed consent                            was obtained. Prior Anticoagulants: The patient has                            taken no anticoagulant or antiplatelet agents. ASA                            Grade Assessment: III - A patient with severe                            systemic disease. After reviewing the risks and                            benefits, the patient was deemed in satisfactory  condition to undergo the procedure.                           After obtaining informed consent, the colonoscope                            was passed under direct vision. Throughout the                            procedure, the patient's blood pressure, pulse, and                            oxygen saturations were monitored continuously. The                             [id number]-[id number] ([id number]) Colon was introduced through                            the anus and advanced to the the cecum, identified                            by appendiceal orifice and ileocecal valve. The                            colonoscopy was performed without difficulty. The                            patient tolerated the procedure well. The quality                            of the bowel preparation was evaluated using the                            BBPS [facility] Bowel Preparation Scale) with scores                            of: Right Colon = 3, Transverse Colon = 3 and Left                            Colon = 3 (entire mucosa seen well with no residual                            staining, small fragments of stool or opaque                            liquid). The total BBPS score equals 9. The                            ileocecal valve, appendiceal orifice, and rectum                            were photographed. Scope In: 9:22:38 AM Scope Out: 9:42:09 AM Scope Withdrawal Time: 0 hours 17 minutes 10  seconds  Total Procedure Duration: 0 hours 19 minutes 31 seconds  Findings:      The perianal and digital rectal examinations were normal.      The exam was otherwise without abnormality.      Non-bleeding internal hemorrhoids were found during retroflexion. The       hemorrhoids were small. Impression:               - The examination was otherwise normal.                           - Non-bleeding internal hemorrhoids.                           - No specimens collected. Moderate Sedation:      Per Anesthesia Care Recommendation:           - Patient has a contact number available for                            emergencies. The signs and symptoms of potential                            delayed complications were discussed with the                            patient. Return to normal activities tomorrow.                            Written discharge instructions were provided  to the                            patient.                           - Resume previous diet.                           - Continue present medications.                           - Repeat colonoscopy in 10 years for screening                            purposes. Procedure Code(s):        --- Professional ---                           [phone], Colonoscopy, flexible; diagnostic, including                            collection of specimen(s) by brushing or washing,                            when performed (separate procedure) Diagnosis Code(s):        --- Professional ---  K64.8, Other hemorrhoids                           D50.9, Iron deficiency anemia, unspecified CPT copyright 2022 American Medical Association. All rights reserved. The codes documented in this report are preliminary and upon coder review may  be revised to meet current compliance requirements. Deatrice Dine, MD Deatrice Dine, MD 05/26/2024 9:49:26 AM This report has been signed electronically. Number of Addenda: 0

## 2024-05-27 ENCOUNTER — Encounter (HOSPITAL_COMMUNITY): Payer: Self-pay | Admitting: Gastroenterology

## 2024-05-28 LAB — SURGICAL PATHOLOGY

## 2024-06-02 ENCOUNTER — Ambulatory Visit (INDEPENDENT_AMBULATORY_CARE_PROVIDER_SITE_OTHER): Payer: Self-pay | Admitting: Gastroenterology

## 2024-06-06 NOTE — Progress Notes (Signed)
 6 mth EGD noted in recall Patient result letter mailed Patient's PCP is on EPIC

## 2024-06-11 ENCOUNTER — Ambulatory Visit: Payer: Medicare HMO

## 2024-06-11 VITALS — Ht 72.0 in | Wt 271.0 lb

## 2024-06-11 DIAGNOSIS — Z0000 Encounter for general adult medical examination without abnormal findings: Secondary | ICD-10-CM | POA: Diagnosis not present

## 2024-06-11 NOTE — Patient Instructions (Signed)
 Dakota Mendez,  Thank you for taking the time for your Medicare Wellness Visit. I appreciate your continued commitment to your health goals. Please review the care plan we discussed, and feel free to reach out if I can assist you further.  Medicare recommends these wellness visits once per year to help you and your care team stay ahead of potential health issues. These visits are designed to focus on prevention, allowing your provider to concentrate on managing your acute and chronic conditions during your regular appointments.  Please note that Annual Wellness Visits do not include a physical exam. Some assessments may be limited, especially if the visit was conducted virtually. If needed, we may recommend a separate in-person follow-up with your provider.  Wishing you excellent health and many blessings in the year to come!  -Patty Leitzke, CMA  Ongoing Care Seeing your primary care provider every 3 to 6 months helps us  monitor your health and provide consistent, personalized care.   Recommended Screenings:  Health Maintenance  Topic Date Due   DTaP/Tdap/Td vaccine (2 - Tdap) 09/21/2016   Zoster (Shingles) Vaccine (2 of 2) 06/22/2022   Eye exam for diabetics  08/09/2023   Flu Shot  04/11/2024   COVID-19 Vaccine (6 - 2025-26 season) 05/12/2024   Yearly kidney health urinalysis for diabetes  05/21/2024   Complete foot exam   09/13/2024   Hemoglobin A1C  10/09/2024   Screening for Lung Cancer  03/12/2025   Yearly kidney function blood test for diabetes  04/08/2025   Medicare Annual Wellness Visit  06/11/2025   Colon Cancer Screening  05/26/2034   Pneumococcal Vaccine for age over 17  Completed   Hepatitis C Screening  Completed   HIV Screening  Completed   Hepatitis B Vaccine  Aged Out   HPV Vaccine  Aged Out   Meningitis B Vaccine  Aged Out       06/11/2024    1:05 PM  Advanced Directives  Does Patient Have a Medical Advance Directive? No  Would patient like information on creating a  medical advance directive? No - Patient declined   Advance Care Planning is important because it: Ensures you receive medical care that aligns with your values, goals, and preferences. Provides guidance to your family and loved ones, reducing the emotional burden of decision-making during critical moments.  Vision: Annual vision screenings are recommended for early detection of glaucoma, cataracts, and diabetic retinopathy. These exams can also reveal signs of chronic conditions such as diabetes and high blood pressure.  Dental: Annual dental screenings help detect early signs of oral cancer, gum disease, and other conditions linked to overall health, including heart disease and diabetes.  Please see the attached documents for additional preventive care recommendations.

## 2024-06-11 NOTE — Progress Notes (Signed)
 Please attest and cosign this visit due to patients primary care provider not being immediately available at the time the visit was completed.   Subjective:   Dakota Mendez is a 63 y.o. who presents for a Medicare Wellness preventive visit. As a reminder, Annual Wellness Visits don't include a physical exam, and some assessments may be limited, especially if this visit is performed virtually. We may recommend an in-person follow-up visit with your provider if needed.  Visit Complete: Virtual I connected with  Marinell LELON Hock on 06/11/24 by a audio enabled telemedicine application and verified that I am speaking with the correct person using two identifiers.  Patient Location: Home  Provider Location: Home Office  I discussed the limitations of evaluation and management by telemedicine. The patient expressed understanding and agreed to proceed.  Vital Signs: Because this visit was a virtual/telehealth visit, some criteria may be missing or patient reported. Any vitals not documented were not able to be obtained and vitals that have been documented are patient reported. VideoDeclined- This patient declined Librarian, academic. Therefore the visit was completed with audio only.  Persons Participating in Visit: Patient.  AWV Questionnaire: No: Patient Medicare AWV questionnaire was not completed prior to this visit. Cardiac Risk Factors include: advanced age (>10men, >17 women);diabetes mellitus;dyslipidemia;hypertension;male gender;obesity (BMI >30kg/m2)     Objective:    Today's Vitals   06/11/24 1256 06/11/24 1259  Weight: 271 lb (122.9 kg)   Height: 6' (1.829 m)   PainSc:  0-No pain   Body mass index is 36.75 kg/m.    06/11/2024    1:05 PM 05/26/2024    7:51 AM 03/27/2024    9:25 AM 09/28/2023    8:48 AM 06/06/2023    8:12 AM 09/25/2022    8:25 AM 08/25/2022   12:00 PM  Advanced Directives  Does Patient Have a Medical Advance Directive? No No No No  Yes Yes Yes  Type of Agricultural consultant;Living will Living will;Healthcare Power of Attorney Living will;Healthcare Power of Attorney  Does patient want to make changes to medical advance directive?      No - Patient declined No - Patient declined  Copy of Healthcare Power of Attorney in Chart?     No - copy requested  No - copy requested  Would patient like information on creating a medical advance directive? No - Patient declined  No - Patient declined No - Patient declined       Current Medications (verified) Outpatient Encounter Medications as of 06/11/2024  Medication Sig   Alcohol  Swabs  PADS 1 each by Does not apply route as directed.   amLODipine  (NORVASC ) 10 MG tablet TAKE 1 TABLET EVERY DAY   Blood Glucose Monitoring Suppl (TRUE METRIX METER) w/Device KIT USE AS DIRECTED   Blood Glucose Monitoring Suppl DEVI 1 each by Does not apply route in the morning, at noon, and at bedtime. Patient insurance prefers One Touch. DX e11.65   cloNIDine  (CATAPRES ) 0.3 MG tablet TAKE 1 TABLET EVERY DAY   cyanocobalamin  (VITAMIN B12) 1000 MCG tablet Take 1 tablet (1,000 mcg total) by mouth daily.   ferrous gluconate  (FERGON) 324 MG tablet Take 1 tablet (324 mg total) by mouth daily with breakfast.   glucose blood (TRUE METRIX BLOOD GLUCOSE TEST) test strip Use as instructed   losartan -hydrochlorothiazide  (HYZAAR) 100-25 MG tablet TAKE 1 TABLET EVERY DAY   metFORMIN  (GLUCOPHAGE ) 1000 MG tablet TAKE 1 TABLET TWICE DAILY  WITH MEALS   pantoprazole  (PROTONIX ) 40 MG tablet Take 1 tablet (40 mg total) by mouth daily.   potassium chloride  (KLOR-CON  M) 10 MEQ tablet TAKE 1 TABLET EVERY DAY   rosuvastatin  (CRESTOR ) 10 MG tablet TAKE 1 TABLET EVERY   TRUEplus Lancets 33G MISC 1 each by Does not apply route as directed.   No facility-administered encounter medications on file as of 06/11/2024.    Allergies (verified) Jardiance  [empagliflozin ]   History: Past Medical History:   Diagnosis Date   Chronic back pain    Chronic neck pain    Diabetes mellitus    Hyperlipidemia    Hypertension    Obesity    Sleep apnea    Not compliant with CPAP   Past Surgical History:  Procedure Laterality Date   CERVICAL SPINE SURGERY     COLONOSCOPY N/A 08/08/2017   Procedure: COLONOSCOPY;  Surgeon: Golda Claudis PENNER, MD;  [facility];  Service: Endoscopy;  Laterality: N/A;  930   COLONOSCOPY N/A 05/26/2024   Procedure: COLONOSCOPY;  Surgeon: Cinderella Deatrice FALCON, MD;  [facility]: [facility];  Service: Endoscopy;  Laterality: N/A;  900AM, ASA 1-2   ESOPHAGOGASTRODUODENOSCOPY N/A 05/26/2024   Procedure: EGD (ESOPHAGOGASTRODUODENOSCOPY);  Surgeon: Cinderella Deatrice FALCON, MD;  Location: [facility];  Service: Endoscopy;  Laterality: N/A;   SPINE SURGERY  09/21/2011   cervial spine   Family History  Problem Relation Age of Onset   Cirrhosis Father    Heart attack Mother    Depression Mother    Stroke Mother    Hearing loss Brother    Cirrhosis Brother    Diabetes Sister    Sleep apnea Son    Hypertension Son    Social History   Socioeconomic History   Marital status: Married    Spouse name: Not on file   Number of children: 4   Years of education: Not on file   Highest education level: 9th grade  Occupational History   Occupation: disabled-truck dirver  Tobacco Use   Smoking status: Former    Current packs/day: 0.00    Average packs/day: 0.5 packs/day for 40.0 years (20.0 ttl pk-yrs)    Types: Cigarettes    Start date: 09/11/1982    Quit date: 09/11/2022    Years since quitting: 1.7   Smokeless tobacco: Never  Vaping Use   Vaping status: Never Used  Substance and Sexual Activity   Alcohol  use: No   Drug use: No   Sexual activity: Yes  Other Topics Concern   Not on file  Social History Narrative   Not on file   Social Drivers of Health   Financial Resource Strain: Low Risk  (06/11/2024)   Overall Financial Resource Strain (CARDIA)    Difficulty of  Paying Living Expenses: Not hard at all  Food Insecurity: No Food Insecurity (06/11/2024)   Hunger Vital Sign    Worried About Running Out of Food in the Last Year: Never true    Ran Out of Food in the Last Year: Never true  Transportation Needs: No Transportation Needs (06/11/2024)   PRAPARE - Administrator, Civil Service (Medical): No    Lack of Transportation (Non-Medical): No  Physical Activity: Sufficiently Active (06/11/2024)   Exercise Vital Sign    Days of Exercise per Week: 7 days    Minutes of Exercise per Session: 30 min  Stress: No Stress Concern Present (06/11/2024)   [employer] of Occupational Health - Occupational Stress Questionnaire  Feeling of Stress: Not at all  Social Connections: Socially Integrated (06/11/2024)   Social Connection and Isolation Panel    Frequency of Communication with Friends and Family: More than three times a week    Frequency of Social Gatherings with Friends and Family: More than three times a week    Attends Religious Services: More than 4 times per year    Active Member of [employer] or Organizations: Yes    Attends Engineer, structural: More than 4 times per year    Marital Status: Married    Tobacco Counseling Counseling given: Yes   Clinical Intake: Pre-visit preparation completed: Yes Pain : No/denies pain Pain Score: 0-No pain   BMI - recorded: 36.75 Nutritional Status: BMI > 30  Obese Nutritional Risks: None Diabetes: Yes CBG done?: No (telehealth visit. unable to obtain) Did pt. bring in CBG monitor from home?: No Lab Results  Component Value Date   HGBA1C 6.4 (H) 04/08/2024   HGBA1C 10.3 (H) 12/13/2023   HGBA1C 8.5 (H) 09/13/2023    How often do you need to have someone help you when you read instructions, pamphlets, or other written materials from your doctor or pharmacy?: 1 - Never Interpreter Needed?: No Information entered by :: Praise Stennett W CMA (AAMA)  Activities of Daily Living     06/11/2024     1:05 PM  In your present state of health, do you have any difficulty performing the following activities:  Hearing? 0  Vision? 0  Difficulty concentrating or making decisions? 0  Walking or climbing stairs? 0  Dressing or bathing? 0  Doing errands, shopping? 0  Preparing Food and eating ? N  Using the Toilet? N  In the past six months, have you accidently leaked urine? N  Do you have problems with loss of bowel control? N  Managing your Medications? N  Managing your Finances? N  Housekeeping or managing your Housekeeping? N   Patient Care Team: Antonetta Rollene BRAVO, MD as PCP - General Debera Jayson MATSU, MD as PCP - Cardiology (Cardiology) Jacinto Lonni PARAS, [facility] (Inactive) (Pharmacist)   I have updated your Care Teams any recent Medical Services you may have received from other providers in the past year.     Assessment:   This is a routine wellness examination for Sukhman.  Hearing/Vision screen Hearing Screening - Comments:: Patient denies any hearing difficulties.   Vision Screening - Comments:: Wears rx glasses - previously seen by Dr Roz who has retired. Resources provided to patient by letter in [place].   Goals Addressed               This Visit's Progress     I want to lose weight (pt-stated)          Depression Screen     06/11/2024    1:15 PM 04/09/2024    8:05 AM 03/27/2024    9:23 AM 12/17/2023    8:07 AM 12/14/2023    8:20 AM 09/14/2023    8:06 AM 07/20/2023    3:12 PM  PHQ 2/9 Scores  PHQ - 2 Score 0 0 0 0 0 0 0  PHQ- 9 Score 0      0     Fall Risk     06/11/2024    1:14 PM 04/09/2024    8:05 AM 02/13/2024    3:01 PM 12/17/2023    8:07 AM 12/14/2023    8:20 AM  Fall Risk   Falls in the past year?  1 0 0 0 0  Number falls in past yr: 1 0 0 0 0  Injury with Fall? 0 0 0 0 0  Risk for fall due to : History of fall(s);Impaired balance/gait;Impaired mobility;Orthopedic patient      Follow up Falls evaluation completed;Education provided;Falls  prevention discussed Falls evaluation completed Falls evaluation completed      MEDICARE RISK AT HOME:  Medicare Risk at Home Any stairs in or around the home?: Yes If so, are there any without handrails?: No Home free of loose throw rugs in walkways, pet beds, electrical cords, etc?: Yes Adequate lighting in your home to reduce risk of falls?: Yes Life alert?: No Use of a cane, walker or w/c?: No Grab bars in the bathroom?: No Shower chair or bench in shower?: No Elevated toilet seat or a handicapped toilet?: No  TIMED UP AND GO: Was the test performed?  No  Cognitive Function: 6CIT completed    04/05/2021    1:03 PM  MMSE - Mini Mental State Exam  Not completed: Unable to complete        06/11/2024    1:15 PM 06/06/2023    8:13 AM 04/10/2022    8:22 AM 04/05/2021    1:03 PM 04/01/2020    1:17 PM  6CIT Screen  What Year? 0 points 0 points 0 points 0 points 0 points  What month? 0 points 0 points 0 points 0 points 0 points  What time? 0 points 0 points 0 points 0 points 0 points  Count back from 20 0 points 0 points 0 points 0 points 0 points  Months in reverse 0 points 0 points 0 points 0 points 0 points  Repeat phrase 0 points 0 points 0 points 0 points 0 points  Total Score 0 points 0 points 0 points 0 points 0 points    Immunizations Immunization History  Administered Date(s) Administered   Influenza Whole 10/11/2007, 05/17/2011   Influenza, Seasonal, Injecte, Preservative Fre 05/22/2023   Influenza,inj,Quad PF,6+ Mos 06/13/2013, 06/18/2014, 07/22/2015, 06/01/2016, 07/25/2017, 05/30/2018, 06/09/2019, 04/29/2020, 06/13/2021, 07/05/2022   Moderna Covid-19 Vaccine Bivalent Booster 16yrs & up 04/27/2022   Moderna Sars-Covid-2 Vaccination 12/05/2019, 01/06/2020, 07/26/2020, 01/28/2021   PNEUMOCOCCAL CONJUGATE-20 02/13/2024   Pneumococcal Conjugate-13 12/15/2014   Pneumococcal Polysaccharide-23 04/05/2011, 06/01/2016   Td 09/21/2006   Zoster Recombinant(Shingrix)  04/27/2022    Screening Tests Health Maintenance  Topic Date Due   DTaP/Tdap/Td (2 - Tdap) 09/21/2016   Zoster Vaccines- Shingrix (2 of 2) 06/22/2022   OPHTHALMOLOGY EXAM  08/09/2023   Influenza Vaccine  04/11/2024   COVID-19 Vaccine (6 - 2025-26 season) 05/12/2024   Diabetic kidney evaluation - Urine ACR  05/21/2024   FOOT EXAM  09/13/2024   HEMOGLOBIN A1C  10/09/2024   Lung Cancer Screening  03/12/2025   Diabetic kidney evaluation - eGFR measurement  04/08/2025   Medicare Annual Wellness (AWV)  06/11/2025   Colonoscopy  05/26/2034   Pneumococcal Vaccine: 50+ Years  Completed   Hepatitis C Screening  Completed   HIV Screening  Completed   Hepatitis B Vaccines 19-59 Average Risk  Aged Out   HPV VACCINES  Aged Out   Meningococcal B Vaccine  Aged Out    Health Maintenance Health Maintenance Due  Topic Date Due   DTaP/Tdap/Td (2 - Tdap) 09/21/2016   Zoster Vaccines- Shingrix (2 of 2) 06/22/2022   OPHTHALMOLOGY EXAM  08/09/2023   Influenza Vaccine  04/11/2024   COVID-19 Vaccine (6 - 2025-26 season) 05/12/2024  Diabetic kidney evaluation - Urine ACR  05/21/2024   Health Maintenance Items Addressed: Appointment scheduled for patient to get flu vaccine  Additional Screening: Vision Screening: Recommended annual ophthalmology exams for early detection of glaucoma and other disorders of the eye. Would you like a referral to an eye doctor? A list of eye care providers were provided to the patient  Dental Screening: Recommended annual dental exams for proper oral hygiene  Community Resource Referral / Chronic Care Management: CRR required this visit?  No   CCM required this visit?  No  Plan:   I have personally reviewed and noted the following in the patient's chart:   Medical and social history Use of alcohol , tobacco or illicit drugs  Current medications and supplements including opioid prescriptions. Patient is not currently taking opioid prescriptions. Functional  ability and status Nutritional status Physical activity Advanced directives List of other physicians Hospitalizations, surgeries, and ER visits in previous 12 months Vitals Screenings to include cognitive, depression, and falls Referrals and appointments  In addition, I have reviewed and discussed with patient certain preventive protocols, quality metrics, and best practice recommendations. A written personalized care plan for preventive services as well as general preventive health recommendations were provided to patient.   Pio Eatherly, CMA   06/11/2024   After Visit Summary: (MyChart) Due to this being a telephonic visit, the after visit summary with patients personalized plan was offered to patient via MyChart   Notes: Nothing significant to report at this time.

## 2024-06-16 ENCOUNTER — Ambulatory Visit (INDEPENDENT_AMBULATORY_CARE_PROVIDER_SITE_OTHER)

## 2024-06-16 DIAGNOSIS — Z23 Encounter for immunization: Secondary | ICD-10-CM

## 2024-06-17 ENCOUNTER — Encounter: Attending: Family Medicine | Admitting: Nutrition

## 2024-06-17 VITALS — Ht 72.0 in | Wt 277.0 lb

## 2024-06-17 DIAGNOSIS — Z6836 Body mass index (BMI) 36.0-36.9, adult: Secondary | ICD-10-CM

## 2024-06-17 DIAGNOSIS — E1169 Type 2 diabetes mellitus with other specified complication: Secondary | ICD-10-CM | POA: Insufficient documentation

## 2024-06-17 DIAGNOSIS — I10 Essential (primary) hypertension: Secondary | ICD-10-CM

## 2024-06-17 DIAGNOSIS — E785 Hyperlipidemia, unspecified: Secondary | ICD-10-CM

## 2024-06-17 NOTE — Progress Notes (Signed)
 Medical Nutrition Therapy 1129  End 1205  Primary concerns today: DM Type 2  Referral diagnosis: E11.8 Preferred learning style: No Preference  Learning readiness: Ready    NUTRITION ASSESSMENT  Still Metfomrin 1000 mg BID. Walking and eating  healthier  FBS 76-100s  Bedtime 120's . Walking 2-1/2 miles most days of the week. Feels better. Clothes are fitting looser and belt size is changing. Due to see PCP in early November for lab work.  Lab Results  Component Value Date   HGBA1C 6.4 (H) 04/08/2024   Clinical Wt Readings from Last 3 Encounters:  06/17/24 277 lb (125.6 kg)  06/11/24 271 lb (122.9 kg)  05/26/24 271 lb (122.9 kg)   Ht Readings from Last 3 Encounters:  06/17/24 6' (1.829 m)  06/11/24 6' (1.829 m)  05/26/24 6' (1.829 m)   Body mass index is 37.57 kg/m. @BMIFA @ Facility age limit for growth %iles is 20 years. Facility age limit for growth %iles is 20 years.  Medical Hx:  Past Medical History:  Diagnosis Date   Chronic back pain    Chronic neck pain    Diabetes mellitus    Hyperlipidemia    Hypertension    Obesity    Sleep apnea    Not compliant with CPAP    Medications:  Current Outpatient Medications on File Prior to Visit  Medication Sig Dispense Refill   Alcohol  Swabs  PADS 1 each by Does not apply route as directed. 100 each 0   amLODipine  (NORVASC ) 10 MG tablet TAKE 1 TABLET EVERY DAY 90 tablet 3   Blood Glucose Monitoring Suppl (TRUE METRIX METER) w/Device KIT USE AS DIRECTED 1 kit 0   Blood Glucose Monitoring Suppl DEVI 1 each by Does not apply route in the morning, at noon, and at bedtime. Patient insurance prefers One Touch. DX e11.65 1 each 0   cloNIDine  (CATAPRES ) 0.3 MG tablet TAKE 1 TABLET EVERY DAY 90 tablet 3   cyanocobalamin  (VITAMIN B12) 1000 MCG tablet Take 1 tablet (1,000 mcg total) by mouth daily. 90 tablet 2   ferrous gluconate  (FERGON) 324 MG tablet Take 1 tablet (324 mg total) by mouth daily with breakfast. 90 tablet 2    glucose blood (TRUE METRIX BLOOD GLUCOSE TEST) test strip Use as instructed 100 strip 0   losartan -hydrochlorothiazide  (HYZAAR) 100-25 MG tablet TAKE 1 TABLET EVERY DAY 90 tablet 3   metFORMIN  (GLUCOPHAGE ) 1000 MG tablet TAKE 1 TABLET TWICE DAILY WITH MEALS 180 tablet 3   pantoprazole  (PROTONIX ) 40 MG tablet Take 1 tablet (40 mg total) by mouth daily. 60 tablet 3   potassium chloride  (KLOR-CON  M) 10 MEQ tablet TAKE 1 TABLET EVERY DAY 90 tablet 3   rosuvastatin  (CRESTOR ) 10 MG tablet TAKE 1 TABLET EVERY 30 tablet 11   TRUEplus Lancets 33G MISC 1 each by Does not apply route as directed. 100 each 1   No current facility-administered medications on file prior to visit.    Labs:  Lab Results  Component Value Date   HGBA1C 6.4 (H) 04/08/2024      Latest Ref Rng & Units 04/08/2024    7:46 AM 12/13/2023    8:04 AM 09/13/2023    8:02 AM  CMP  Glucose 70 - 99 mg/dL 868  809  806   BUN 8 - 27 mg/dL 21  15  17    Creatinine 0.76 - 1.27 mg/dL 8.93  9.02  9.00   Sodium 134 - 144 mmol/L 142  142  139  Potassium 3.5 - 5.2 mmol/L 4.4  4.0  3.9   Chloride 96 - 106 mmol/L 103  101  97   CO2 20 - 29 mmol/L 19  23  23    Calcium  8.6 - 10.2 mg/dL 9.1  9.4  9.3   Total Protein 6.0 - 8.5 g/dL  6.6  6.7   Total Bilirubin 0.0 - 1.2 mg/dL  0.9  0.8   Alkaline Phos 44 - 121 IU/L  66  71   AST 0 - 40 IU/L  18  16   ALT 0 - 44 IU/L  19  20    Lipid Panel     Component Value Date/Time   CHOL 144 12/13/2023 0804   TRIG 94 12/13/2023 0804   HDL 53 12/13/2023 0804   CHOLHDL 2.7 12/13/2023 0804   CHOLHDL 4.5 08/12/2019 0736   VLDL 63 (H) 02/13/2017 0720   LDLCALC 73 12/13/2023 0804   LDLCALC 115 (H) 08/12/2019 0736   LABVLDL 18 12/13/2023 0804    Notable Signs/Symptoms: Weak and shaky at times.   Lifestyle & Dietary Hx Lives with wife. Likes to fish.  Estimated daily fluid intake: 60 oz Supplements:  Sleep:  Stress / self-care:  Current average weekly physical activity: walks about 2 miles most  days  24-Hr Dietary Recall First Meal: Eggs and Oatmeal or [employer]  toast and fruit Snack: Second Meal: Vegetables, fruit, water  Snack:  Third Meal: Baked fish, greens, cabbage, baked potato and water  Beverages: water -  Estimated Energy Needs Calories: 1800 Carbohydrate: 200g Protein: 135g Fat: 50g   NUTRITION DIAGNOSIS  NB-1.1 Food and nutrition-related knowledge deficit As related to Diabetes Type 2.  As evidenced by A1C 10.3%.   NUTRITION INTERVENTION  Nutrition education (E-1) on the following topics:  Nutrition and Diabetes education provided on My Plate, CHO counting, meal planning, portion sizes, timing of meals, avoiding snacks between meals unless having a low blood sugar, target ranges for A1C and blood sugars, signs/symptoms and treatment of hyper/hypoglycemia, monitoring blood sugars, taking medications as prescribed, benefits of exercising 30 minutes per day and prevention of complications of DM.  Lifestyle Medicine  - Whole Food, Plant Predominant Nutrition is highly recommended: Eat Plenty of vegetables, Mushrooms, fruits, Legumes, Whole Grains, Nuts, seeds in lieu of processed meats, processed snacks/pastries red meat, poultry, eggs.    -It is better to avoid simple carbohydrates including: Cakes, Sweet Desserts, Ice Cream, Soda (diet and regular), Sweet Tea, Candies, Chips, Cookies, Store Bought Juices, Alcohol  in Excess of  1-2 drinks a day, Lemonade,  Artificial Sweeteners, Doughnuts, Coffee Creamers, Sugar-free Products, etc, etc.  This is not a complete list.....  Exercise: If you are able: 30 -60 minutes a day ,4 days a week, or 150 minutes a week.  The longer the better.  Combine stretch, strength, and aerobic activities.  If you were told in the past that you have high risk for cardiovascular diseases, you may seek evaluation by your heart doctor prior to initiating moderate to intense exercise programs.   Handouts Provided Include  Lifestyle Medicine  Handouts Know Your numbers. Glucose log sheets  Learning Style & Readiness for Change Teaching method utilized: Visual & Auditory  Demonstrated degree of understanding via: Teach Back  Barriers to learning/adherence to lifestyle change: none  Goals Established by Pt Keep up the great job. Keep walking 2 miles a day or add in some weight machines at the [employer] foods from garden and high fiber foods.   MONITORING &  EVALUATION Dietary intake, weekly physical activity, and blood sugars in 4 month.  Next Steps  Patient is to work on cutting out processed foods and eating foods that are whole plant based.SABRA

## 2024-06-24 ENCOUNTER — Other Ambulatory Visit: Payer: Self-pay | Admitting: Family Medicine

## 2024-06-24 MED ORDER — TRUE METRIX BLOOD GLUCOSE TEST VI STRP: ORAL_STRIP | 0 refills | Status: DC

## 2024-06-24 NOTE — Telephone Encounter (Signed)
 Copied from CRM [id number]. Topic: Clinical - Medication Refill >> Jun 24, 2024 11:03 AM Ahlexyia S wrote: Medication: glucose blood (TRUE METRIX BLOOD GLUCOSE TEST) test strip  Has the patient contacted their pharmacy? Yes, pt was told there were no refills and to contact clinic. (Agent: If no, request that the patient contact the pharmacy for the refill. If patient does not wish to contact the pharmacy document the reason why and proceed with request.) (Agent: If yes, when and what did the pharmacy advise?)  This is the patient's preferred pharmacy:  [facility] - [place], [place] - [address] [place] [address] Phone: [phone] Fax: [phone]  Is this the correct pharmacy for this prescription? Yes If no, delete pharmacy and type the correct one.   Has the prescription been filled recently? No  Is the patient out of the medication? No  Has the patient been seen for an appointment in the last year OR does the patient have an upcoming appointment? Yes  Can we respond through MyChart? Yes  Agent: Please be advised that Rx refills may take up to 3 business days. We ask that you follow-up with your pharmacy.

## 2024-06-30 ENCOUNTER — Encounter: Payer: Self-pay | Admitting: Nutrition

## 2024-06-30 ENCOUNTER — Other Ambulatory Visit: Payer: Self-pay | Admitting: Family Medicine

## 2024-06-30 DIAGNOSIS — E1159 Type 2 diabetes mellitus with other circulatory complications: Secondary | ICD-10-CM

## 2024-06-30 DIAGNOSIS — I10 Essential (primary) hypertension: Secondary | ICD-10-CM

## 2024-06-30 NOTE — Patient Instructions (Signed)
 Keep up the great job!

## 2024-07-16 ENCOUNTER — Other Ambulatory Visit: Payer: Self-pay | Admitting: Family Medicine

## 2024-07-23 ENCOUNTER — Other Ambulatory Visit: Payer: Self-pay

## 2024-07-23 DIAGNOSIS — D72829 Elevated white blood cell count, unspecified: Secondary | ICD-10-CM

## 2024-07-23 DIAGNOSIS — E538 Deficiency of other specified B group vitamins: Secondary | ICD-10-CM

## 2024-07-23 DIAGNOSIS — D649 Anemia, unspecified: Secondary | ICD-10-CM

## 2024-07-23 DIAGNOSIS — D509 Iron deficiency anemia, unspecified: Secondary | ICD-10-CM

## 2024-07-24 ENCOUNTER — Inpatient Hospital Stay: Attending: Physician Assistant

## 2024-07-24 DIAGNOSIS — K2950 Unspecified chronic gastritis without bleeding: Secondary | ICD-10-CM | POA: Diagnosis not present

## 2024-07-24 DIAGNOSIS — D72829 Elevated white blood cell count, unspecified: Secondary | ICD-10-CM

## 2024-07-24 DIAGNOSIS — D72820 Lymphocytosis (symptomatic): Secondary | ICD-10-CM | POA: Diagnosis not present

## 2024-07-24 DIAGNOSIS — D509 Iron deficiency anemia, unspecified: Secondary | ICD-10-CM

## 2024-07-24 DIAGNOSIS — K648 Other hemorrhoids: Secondary | ICD-10-CM | POA: Diagnosis not present

## 2024-07-24 DIAGNOSIS — Z87891 Personal history of nicotine dependence: Secondary | ICD-10-CM | POA: Diagnosis not present

## 2024-07-24 DIAGNOSIS — D649 Anemia, unspecified: Secondary | ICD-10-CM

## 2024-07-24 DIAGNOSIS — E538 Deficiency of other specified B group vitamins: Secondary | ICD-10-CM

## 2024-07-24 DIAGNOSIS — R700 Elevated erythrocyte sedimentation rate: Secondary | ICD-10-CM | POA: Diagnosis not present

## 2024-07-24 LAB — CBC WITH DIFFERENTIAL/PLATELET
Abs Immature Granulocytes: 0.02 K/uL (ref 0.00–0.07)
Basophils Absolute: 0.1 K/uL (ref 0.0–0.1)
Basophils Relative: 1 %
Eosinophils Absolute: 0.2 K/uL (ref 0.0–0.5)
Eosinophils Relative: 2 %
HCT: 39.9 % (ref 39.0–52.0)
Hemoglobin: 12.9 g/dL — ABNORMAL LOW (ref 13.0–17.0)
Immature Granulocytes: 0 %
Lymphocytes Relative: 29 %
Lymphs Abs: 2.6 K/uL (ref 0.7–4.0)
MCH: 27.8 pg (ref 26.0–34.0)
MCHC: 32.3 g/dL (ref 30.0–36.0)
MCV: 86 fL (ref 80.0–100.0)
Monocytes Absolute: 0.7 K/uL (ref 0.1–1.0)
Monocytes Relative: 7 %
Neutro Abs: 5.4 K/uL (ref 1.7–7.7)
Neutrophils Relative %: 61 %
Platelets: 332 K/uL (ref 150–400)
RBC: 4.64 MIL/uL (ref 4.22–5.81)
RDW: 14.1 % (ref 11.5–15.5)
WBC: 8.9 K/uL (ref 4.0–10.5)
nRBC: 0 % (ref 0.0–0.2)

## 2024-07-24 LAB — COMPREHENSIVE METABOLIC PANEL WITH GFR
ALT: 8 U/L (ref 0–44)
AST: 13 U/L — ABNORMAL LOW (ref 15–41)
Albumin: 4.4 g/dL (ref 3.5–5.0)
Alkaline Phosphatase: 59 U/L (ref 38–126)
Anion gap: 12 (ref 5–15)
BUN: 18 mg/dL (ref 8–23)
CO2: 28 mmol/L (ref 22–32)
Calcium: 9.1 mg/dL (ref 8.9–10.3)
Chloride: 101 mmol/L (ref 98–111)
Creatinine, Ser: 0.95 mg/dL (ref 0.61–1.24)
GFR, Estimated: >60 mL/min (ref >60)
Glucose, Bld: 128 mg/dL — ABNORMAL HIGH (ref 70–99)
Potassium: 3.9 mmol/L (ref 3.5–5.1)
Sodium: 140 mmol/L (ref 135–145)
Total Bilirubin: 0.9 mg/dL (ref 0.0–1.2)
Total Protein: 7.5 g/dL (ref 6.5–8.1)

## 2024-07-24 LAB — FERRITIN: Ferritin: 35 ng/mL (ref 24–336)

## 2024-07-24 LAB — IRON AND TIBC
Iron: 74 ug/dL (ref 45–182)
Saturation Ratios: 19 % (ref 17.9–39.5)
TIBC: 391 ug/dL (ref 250–450)
UIBC: 316 ug/dL

## 2024-07-24 LAB — VITAMIN B12: Vitamin B-12: 469 pg/mL (ref 180–914)

## 2024-07-26 LAB — LAB REPORT - SCANNED
Albumin/Creatinine Ratio, Urine, POC: 23
Creatinine, POC: 183 mg/dL

## 2024-07-29 ENCOUNTER — Telehealth: Payer: Self-pay

## 2024-07-29 LAB — OPHTHALMOLOGY REPORT-SCANNED

## 2024-07-29 NOTE — Telephone Encounter (Signed)
 Copied from CRM [id number]. Topic: Clinical - Medication Question >> Jul 29, 2024 11:18 AM Kevelyn M wrote: Reason for CRM: Patient would like to get his blood work redone because he ate the morning he got blood work (Friday 07/25/2024). He's asking if his insurance would pay for it again. He [facility] for the [facility].  Call back # [phone]

## 2024-07-30 ENCOUNTER — Encounter: Payer: Self-pay | Admitting: Family Medicine

## 2024-07-30 ENCOUNTER — Ambulatory Visit: Admitting: Family Medicine

## 2024-07-30 VITALS — BP 137/85 | HR 88 | Resp 16 | Ht 72.0 in | Wt 274.0 lb

## 2024-07-30 DIAGNOSIS — Z7984 Long term (current) use of oral hypoglycemic drugs: Secondary | ICD-10-CM | POA: Diagnosis not present

## 2024-07-30 DIAGNOSIS — E785 Hyperlipidemia, unspecified: Secondary | ICD-10-CM | POA: Diagnosis not present

## 2024-07-30 DIAGNOSIS — E1169 Type 2 diabetes mellitus with other specified complication: Secondary | ICD-10-CM

## 2024-07-30 DIAGNOSIS — I10 Essential (primary) hypertension: Secondary | ICD-10-CM

## 2024-07-30 DIAGNOSIS — E1159 Type 2 diabetes mellitus with other circulatory complications: Secondary | ICD-10-CM | POA: Diagnosis not present

## 2024-07-30 NOTE — Assessment & Plan Note (Signed)
 Diabetes associated with hypertension, hyperlipidemia, obesity, and arthritis  [name] is reminded of the importance of commitment to daily physical activity for 30 minutes or more, as able and the need to limit carbohydrate intake to 30 to 60 grams per meal to help with blood sugar control.   The need to take medication as prescribed, test blood sugar as directed, and to call between visits if there is a concern that blood sugar is uncontrolled is also discussed.   [name] is reminded of the importance of daily foot exam, annual eye examination, and good blood sugar, blood pressure and cholesterol control.     Latest Ref Rng & Units 07/24/2024    9:42 AM 04/08/2024    7:46 AM 12/13/2023    8:04 AM 09/13/2023    8:02 AM 05/22/2023    9:18 AM  Diabetic Labs  HbA1c 4.8 - 5.6 %  6.4  10.3  8.5    Micro/Creat Ratio 0 - 29 mg/g creat     18   Chol 100 - 199 mg/dL   855  844    HDL >60 mg/dL   53  53    Calc LDL 0 - 99 mg/dL   73  74    Triglycerides 0 - 149 mg/dL   94  831    Creatinine 0.61 - 1.24 mg/dL 9.04  8.93  9.02  9.00        07/30/2024    8:59 AM 06/17/2024   11:47 AM 06/11/2024   12:56 PM 05/26/2024    9:59 AM 05/26/2024    9:57 AM 05/26/2024    9:47 AM 05/26/2024    8:05 AM  BP/Weight  Systolic BP 137  -- 115 102 98 138  Diastolic BP 85  -- 82 91 67 86  Wt. (Lbs) 274 277 271    271  BMI 37.16 kg/m2 37.57 kg/m2 36.75 kg/m2    36.75 kg/m2      05/23/2023    9:41 AM 08/08/2022   12:00 AM  Foot/eye exam completion dates  Eye Exam --     No Retinopathy         This result is from an external source.      Updated lab needed at/ before next visit.

## 2024-07-30 NOTE — Assessment & Plan Note (Signed)
  Patient re-educated about  the importance of commitment to a  minimum of 150 minutes of exercise per week as able.  The importance of healthy food choices with portion control discussed, as well as eating regularly and within a 12 hour window most days. The need to choose clean , green food 50 to 75% of the time is discussed, as well as to make water  the primary drink and set a goal of 64 ounces water  daily.       07/30/2024    8:59 AM 06/17/2024   11:47 AM 06/11/2024   12:56 PM  Weight /BMI  Weight 274 lb 277 lb 271 lb  Height 6' (1.829 m) 6' (1.829 m) 6' (1.829 m)  BMI 37.16 kg/m2 37.57 kg/m2 36.75 kg/m2    unchanged

## 2024-07-30 NOTE — Assessment & Plan Note (Signed)
 DASH diet and commitment to daily physical activity for a minimum of 30 minutes discussed and encouraged, as a part of hypertension management. The importance of attaining a healthy weight is also discussed.     07/30/2024    8:59 AM 06/17/2024   11:47 AM 06/11/2024   12:56 PM 05/26/2024    9:59 AM 05/26/2024    9:57 AM 05/26/2024    9:47 AM 05/26/2024    8:05 AM  BP/Weight  Systolic BP 137  -- 115 102 98 138  Diastolic BP 85  -- 82 91 67 86  Wt. (Lbs) 274 277 271    271  BMI 37.16 kg/m2 37.57 kg/m2 36.75 kg/m2    36.75 kg/m2     Controlled, no change in medication

## 2024-07-30 NOTE — Assessment & Plan Note (Signed)
 Hyperlipidemia:Low fat diet discussed and encouraged.   Lipid Panel  Lab Results  Component Value Date   CHOL 144 12/13/2023   HDL 53 12/13/2023   LDLCALC 73 12/13/2023   TRIG 94 12/13/2023   CHOLHDL 2.7 12/13/2023     Updated lab needed at/ before next visit.\

## 2024-07-30 NOTE — Patient Instructions (Addendum)
 Annual exam in 12 to 14 weeks   Pls send the names of the eye Specialists you want to be referred to  Urine ACR today  You need Covid, TdAP and shingrix #2 vaccines , they are overdue, please get at your pharmacy  It is important that you exercise regularly at least 30 minutes 5 times a week. If you develop chest pain, have severe difficulty breathing, or feel very tired, stop exercising immediately and seek medical attention   Think about what you will eat, plan ahead. Choose  clean, green, fresh or frozen over canned, processed or packaged foods which are more sugary, salty and fatty. 70 to 75% of food eaten should be vegetables and fruit. Three meals at set times with snacks allowed between meals, but they must be fruit or vegetables. Aim to eat over a 12 hour period , example 7 am to 7 pm, and STOP after  your last meal of the day. Drink water ,generally about 64 ounces per day, no other drink is as healthy. Fruit juice is best enjoyed in a healthy way, by EATING the fruit.   Thanks for choosing [facility], we consider it a privelige to serve you.

## 2024-07-30 NOTE — Progress Notes (Signed)
 Dakota Mendez     MRN: [id number]      DOB: [birth year]  Chief Complaint  Patient presents with   Medical Management of Chronic Issues    Follow up     HPI Dakota Mendez is here for follow up and re-evaluation of chronic medical conditions, medication management and review of any available recent lab and radiology data.  Preventive health is updated, specifically  Cancer screening and Immunization.   Questions or concerns regarding consultations or procedures which the PT has had in the interim are  addressed. The PT denies any adverse reactions to current medications since the last visit.  There are no new concerns.  There are no specific complaints  Denies hypoglycemic episodes off of glipizide  but states that at times blood sugar is 150 in the morning depending on what he has eaten  ROS Denies recent fever or chills. Denies sinus pressure, nasal congestion, ear pain or sore throat. Denies chest congestion, productive cough or wheezing. Denies chest pains, palpitations and leg swelling Denies abdominal pain, nausea, vomiting,diarrhea or constipation.   Denies dysuria, frequency, hesitancy or incontinence. Denies joint pain, swelling and limitation in mobility. Denies headaches, seizures, numbness, or tingling. Denies depression, anxiety or insomnia. Denies skin break down or rash.   PE  BP 137/85   Pulse 88   Resp 16   Ht 6' (1.829 m)   Wt 274 lb (124.3 kg)   SpO2 96%   BMI 37.16 kg/m   Patient alert and oriented and in no cardiopulmonary distress.  HEENT: No facial asymmetry, EOMI,     Neck supple .  Chest: Clear to auscultation bilaterally.  CVS: S1, S2 no murmurs, no S3.Regular rate.  ABD: Soft non tender.   Ext: No edema  MS: Adequate ROM spine, shoulders, hips and knees.  Skin: Intact, no ulcerations or rash noted.  Psych: Good eye contact, normal affect. Memory intact not anxious or depressed appearing.  CNS: CN 2-12 intact, power,  normal  throughout.no focal deficits noted.   Assessment & Plan  Type 2 diabetes mellitus with other specified complication (HCC) Diabetes associated with hypertension, hyperlipidemia, obesity, and arthritis  Dakota Mendez is reminded of the importance of commitment to daily physical activity for 30 minutes or more, as able and the need to limit carbohydrate intake to 30 to 60 grams per meal to help with blood sugar control.   The need to take medication as prescribed, test blood sugar as directed, and to call between visits if there is a concern that blood sugar is uncontrolled is also discussed.   Dakota Mendez is reminded of the importance of daily foot exam, annual eye examination, and good blood sugar, blood pressure and cholesterol control.     Latest Ref Rng & Units 07/24/2024    9:42 AM 04/08/2024    7:46 AM 12/13/2023    8:04 AM 09/13/2023    8:02 AM 05/22/2023    9:18 AM  Diabetic Labs  HbA1c 4.8 - 5.6 %  6.4  10.3  8.5    Micro/Creat Ratio 0 - 29 mg/g creat     18   Chol 100 - 199 mg/dL   855  844    HDL >60 mg/dL   53  53    Calc LDL 0 - 99 mg/dL   73  74    Triglycerides 0 - 149 mg/dL   94  831    Creatinine 0.61 - 1.24 mg/dL 9.04  8.93  9.02  0.99        07/30/2024    8:59 AM 06/17/2024   11:47 AM 06/11/2024   12:56 PM 05/26/2024    9:59 AM 05/26/2024    9:57 AM 05/26/2024    9:47 AM 05/26/2024    8:05 AM  BP/Weight  Systolic BP 137  -- 115 102 98 138  Diastolic BP 85  -- 82 91 67 86  Wt. (Lbs) 274 277 271    271  BMI 37.16 kg/m2 37.57 kg/m2 36.75 kg/m2    36.75 kg/m2      05/23/2023    9:41 AM 08/08/2022   12:00 AM  Foot/eye exam completion dates  Eye Exam --     No Retinopathy         This result is from an external source.      Updated lab needed at/ before next visit.   Essential hypertension DASH diet and commitment to daily physical activity for a minimum of 30 minutes discussed and encouraged, as a part of hypertension management. The importance of  attaining a healthy weight is also discussed.     07/30/2024    8:59 AM 06/17/2024   11:47 AM 06/11/2024   12:56 PM 05/26/2024    9:59 AM 05/26/2024    9:57 AM 05/26/2024    9:47 AM 05/26/2024    8:05 AM  BP/Weight  Systolic BP 137  -- 115 102 98 138  Diastolic BP 85  -- 82 91 67 86  Wt. (Lbs) 274 277 271    271  BMI 37.16 kg/m2 37.57 kg/m2 36.75 kg/m2    36.75 kg/m2     Controlled, no change in medication   Hyperlipidemia LDL goal <100 Hyperlipidemia:Low fat diet discussed and encouraged.   Lipid Panel  Lab Results  Component Value Date   CHOL 144 12/13/2023   HDL 53 12/13/2023   LDLCALC 73 12/13/2023   TRIG 94 12/13/2023   CHOLHDL 2.7 12/13/2023     Updated lab needed at/ before next visit.   Morbid obesity (HCC)  Patient re-educated about  the importance of commitment to a  minimum of 150 minutes of exercise per week as able.  The importance of healthy food choices with portion control discussed, as well as eating regularly and within a 12 hour window most days. The need to choose clean , green food 50 to 75% of the time is discussed, as well as to make water  the primary drink and set a goal of 64 ounces water  daily.       07/30/2024    8:59 AM 06/17/2024   11:47 AM 06/11/2024   12:56 PM  Weight /BMI  Weight 274 lb 277 lb 271 lb  Height 6' (1.829 m) 6' (1.829 m) 6' (1.829 m)  BMI 37.16 kg/m2 37.57 kg/m2 36.75 kg/m2    unchanged

## 2024-07-31 ENCOUNTER — Ambulatory Visit: Payer: Self-pay | Admitting: Family Medicine

## 2024-07-31 ENCOUNTER — Inpatient Hospital Stay (HOSPITAL_BASED_OUTPATIENT_CLINIC_OR_DEPARTMENT_OTHER): Admitting: Oncology

## 2024-07-31 VITALS — BP 133/79 | HR 84 | Temp 97.6°F | Resp 18 | Ht 72.0 in | Wt 274.0 lb

## 2024-07-31 DIAGNOSIS — K2950 Unspecified chronic gastritis without bleeding: Secondary | ICD-10-CM | POA: Diagnosis not present

## 2024-07-31 DIAGNOSIS — D72829 Elevated white blood cell count, unspecified: Secondary | ICD-10-CM | POA: Diagnosis not present

## 2024-07-31 DIAGNOSIS — Z720 Tobacco use: Secondary | ICD-10-CM

## 2024-07-31 DIAGNOSIS — D500 Iron deficiency anemia secondary to blood loss (chronic): Secondary | ICD-10-CM | POA: Diagnosis not present

## 2024-07-31 DIAGNOSIS — D649 Anemia, unspecified: Secondary | ICD-10-CM | POA: Insufficient documentation

## 2024-07-31 LAB — CMP14+EGFR
ALT: 11 IU/L (ref 0–44)
AST: 12 IU/L (ref 0–40)
Albumin: 4.2 g/dL (ref 3.9–4.9)
Alkaline Phosphatase: 61 IU/L (ref 47–123)
BUN/Creatinine Ratio: 20 (ref 10–24)
BUN: 21 mg/dL (ref 8–27)
Bilirubin Total: 0.9 mg/dL (ref 0.0–1.2)
CO2: 25 mmol/L (ref 20–29)
Calcium: 8.9 mg/dL (ref 8.6–10.2)
Chloride: 101 mmol/L (ref 96–106)
Creatinine, Ser: 1.05 mg/dL (ref 0.76–1.27)
Globulin, Total: 2.6 g/dL (ref 1.5–4.5)
Glucose: 149 mg/dL — ABNORMAL HIGH (ref 70–99)
Potassium: 4 mmol/L (ref 3.5–5.2)
Sodium: 139 mmol/L (ref 134–144)
Total Protein: 6.8 g/dL (ref 6.0–8.5)
eGFR: 81 mL/min/1.73 (ref >59)

## 2024-07-31 LAB — LIPID PANEL
Chol/HDL Ratio: 2.6 ratio (ref 0.0–5.0)
Cholesterol, Total: 142 mg/dL (ref 100–199)
HDL: 54 mg/dL (ref >39)
LDL Chol Calc (NIH): 70 mg/dL (ref 0–99)
Triglycerides: 98 mg/dL (ref 0–149)
VLDL Cholesterol Cal: 18 mg/dL (ref 5–40)

## 2024-07-31 LAB — HEMOGLOBIN A1C
Est. average glucose Bld gHb Est-mCnc: 177 mg/dL
Hgb A1c MFr Bld: 7.8 % — ABNORMAL HIGH (ref 4.8–5.6)

## 2024-07-31 MED ORDER — SEMAGLUTIDE 7 MG PO TABS: 7.0000 mg | ORAL_TABLET | Freq: Every day | ORAL | 3 refills | Status: DC

## 2024-07-31 MED ORDER — SEMAGLUTIDE 3 MG PO TABS: 3.0000 mg | ORAL_TABLET | Freq: Every day | ORAL | 0 refills | Status: DC

## 2024-07-31 NOTE — Assessment & Plan Note (Addendum)
-   Mildly elevated WBC since 2009, ranging from 11,000-13,900, predominantly neutrophils but with occasional lymphocytosis - He denies any recurrent infections.  No steroid medications.  No prior splenectomy.  No known history of inflammatory/autoimmune disease. - Denies any fevers or night sweats. Lost about 15 pounds in the last 6 months but was trying to lose weight with exercise and diet control.  - Former smoker 1 PPD x 40 years - Hematology workup (08/25/2022): BCR ABL FISH negative Negative JAK2, CALR, MPL Normal CRP.  Mildly elevated ESR 19.  LDH normal. - Most recent CBC (09/13/23) show white count of 8.7 (8.2).  -MPN workup was negative.  Suspect reactive leukocytosis. -She has not had an elevated white count in over a year. -No additional workup needed.

## 2024-07-31 NOTE — Addendum Note (Signed)
 Addended by: ANTONETTA QUANT E on: 07/31/2024 10:01 AM   Modules accepted: Orders

## 2024-07-31 NOTE — Assessment & Plan Note (Addendum)
-   Smoked 1 pack/day for 40 years.  Quit 2 year ago. - Low-dose CT scan on 07/27/2022 was lung RADS 2S -CT scan from 03/21/2024 shows lung RADS 3, will benign findings.  Short-term follow-up in 6 months is recommended. - He is scheduled for his next low-dose CT scan on 09/30/2024. -Will call with results.

## 2024-07-31 NOTE — Progress Notes (Signed)
 [facility] [address], [place] [address]   CLINIC:  Medical Oncology/Hematology  PCP:  Antonetta Rollene BRAVO, MD [address] [place] [address] [phone]   REASON FOR VISIT:  Follow-up for leukocytosis  PRIOR THERAPY: None  CURRENT THERAPY: Under workup   INTERVAL HISTORY:   Dakota Mendez 63 y.o. male returns for routine follow-up of leukocytosis.  In the interim, he denies any hospitalizations, surgeries or changes to his baseline health.  He denies any bleeding, melena or hematochezia.  Reports normal bowel movements.   He was started on B12 and iron supplements at his last visit.  Tolerating them well.  Denies any constipation or GI side effects.  Patient had low-dose CT scan in July 2025 which showed lung RADS 3 and short-term follow-up.  He is scheduled for repeat CT chest in January 2026.  Patient had colonoscopy/EGD on 05/26/2024 which showed nonbleeding internal hemorrhoids and nodular mucosa was found in entire examined stomach.  Biopsy was.  Diffuse mucosal changes by polypoid lesion which was found in the stomach.  Diffuse moderate inflammation characterized by nodularity in the entire stomach.  Gastritis.  Pathology showed chronic inactive gastritis, intestinal metaplasia present, H. pylori negative, hyperplastic polyp small bowel mucosa pathologic change.  Reports he quit smoking 2 years ago.  Appetite and energy levels are 100%.  ASSESSMENT & PLAN:   Assessment & Plan Iron deficiency anemia due to chronic blood loss -Found incidentally on recent lab draw. -Labs from 09/28/2023 show hemoglobin of 12.8.  No other low hemoglobin over the last 15 years. -He denies any bleeding.  He had a colonoscopy on 05/26/2024 which showed internal hemorrhoids and nodular mucosa in the entire examined stomach.  Pathology was consistent with gastritis, intestinal metaplasia present H. pylori negative. -Nutritional workup from 03/21/2024  shows iron saturation is 10%, ferritin 10, folate 11.9, copper  level 111, vitamin B12 148 and hemoglobin 12.8. - Repeat labs from 07/24/2024 show improvement of iron levels and B12 levels. -We discussed continuing iron and B12 supplements. -No need for IV iron at this time.  Return to clinic in 6 months with labs a few days before.  Leukocytosis, unspecified type - Mildly elevated WBC since 2009, ranging from 11,000-13,900, predominantly neutrophils but with occasional lymphocytosis - He denies any recurrent infections.  No steroid medications.  No prior splenectomy.  No known history of inflammatory/autoimmune disease. - Denies any fevers or night sweats. Lost about 15 pounds in the last 6 months but was trying to lose weight with exercise and diet control.  - Former smoker 1 PPD x 40 years - Hematology workup (08/25/2022): BCR ABL FISH negative Negative JAK2, CALR, MPL Normal CRP.  Mildly elevated ESR 19.  LDH normal. - Most recent CBC (09/13/23) show white count of 8.7 (8.2).  -MPN workup was negative.  Suspect reactive leukocytosis. -She has not had an elevated white count in over a year. -No additional workup needed.  Tobacco abuse - Smoked 1 pack/day for 40 years.  Quit 2 year ago. - Low-dose CT scan on 07/27/2022 was lung RADS 2S -CT scan from 03/21/2024 shows lung RADS 3, will benign findings.  Short-term follow-up in 6 months is recommended. - He is scheduled for his next low-dose CT scan on 09/30/2024. -Will call with results.    REVIEW OF SYSTEMS: Patient denies any acute complaints at today's visit  Review of Systems  Constitutional:  Negative for fatigue.  Musculoskeletal:  Negative for gait problem.  Neurological:  Negative for dizziness and gait problem.     PHYSICAL EXAM:  ECOG PERFORMANCE STATUS: 0 - Asymptomatic  Vitals:   07/31/24 1024  BP: 133/79  Pulse: 84  Resp: 18  Temp: 97.6 F (36.4 C)  SpO2: 99%    Filed Weights   07/31/24 1024  Weight: 274 lb  (124.3 kg)    Physical Exam Constitutional:      Appearance: Normal appearance.  Cardiovascular:     Rate and Rhythm: Normal rate and regular rhythm.  Pulmonary:     Effort: Pulmonary effort is normal.     Breath sounds: Normal breath sounds.  Abdominal:     General: Bowel sounds are normal.     Palpations: Abdomen is soft.  Musculoskeletal:        General: No swelling. Normal range of motion.  Neurological:     Mental Status: He is alert and oriented to person, place, and time. Mental status is at baseline.     PAST MEDICAL/SURGICAL HISTORY:  Past Medical History:  Diagnosis Date   Chronic back pain    Chronic neck pain    Diabetes mellitus    Hyperlipidemia    Hypertension    Obesity    Sleep apnea    Not compliant with CPAP   Past Surgical History:  Procedure Laterality Date   CERVICAL SPINE SURGERY     COLONOSCOPY N/A 08/08/2017   Procedure: COLONOSCOPY;  Surgeon: Golda Claudis PENNER, MD;  [facility];  Service: Endoscopy;  Laterality: N/A;  930   COLONOSCOPY N/A 05/26/2024   Procedure: COLONOSCOPY;  Surgeon: Cinderella Deatrice FALCON, MD;  Location: [facility];  Service: Endoscopy;  Laterality: N/A;  900AM, ASA 1-2   ESOPHAGOGASTRODUODENOSCOPY N/A 05/26/2024   Procedure: EGD (ESOPHAGOGASTRODUODENOSCOPY);  Surgeon: Cinderella Deatrice FALCON, MD;  Location: [facility];  Service: Endoscopy;  Laterality: N/A;   SPINE SURGERY  09/21/2011   cervial spine    SOCIAL HISTORY:  Social History   Socioeconomic History   Marital status: Married    Spouse name: Not on file   Number of children: 4   Years of education: Not on file   Highest education level: 9th grade  Occupational History   Occupation: disabled-truck dirver  Tobacco Use   Smoking status: Former    Current packs/day: 0.00    Average packs/day: 0.5 packs/day for 40.0 years (20.0 ttl pk-yrs)    Types: Cigarettes    Start date: 09/11/1982    Quit date: 09/11/2022    Years since quitting: 1.8   Smokeless  tobacco: Never  Vaping Use   Vaping status: Never Used  Substance and Sexual Activity   Alcohol  use: No   Drug use: No   Sexual activity: Yes  Other Topics Concern   Not on file  Social History Narrative   Not on file   Social Drivers of Health   Financial Resource Strain: Low Risk  (06/11/2024)   Overall Financial Resource Strain (CARDIA)    Difficulty of Paying Living Expenses: Not hard at all  Food Insecurity: No Food Insecurity (06/11/2024)   Hunger Vital Sign    Worried About Running Out of Food in the Last Year: Never true    Ran Out of Food in the Last Year: Never true  Transportation Needs: No Transportation Needs (06/11/2024)   PRAPARE - Administrator, Civil Service (Medical): No    Lack of Transportation (Non-Medical): No  Physical Activity: Sufficiently Active (06/11/2024)  Exercise Vital Sign    Days of Exercise per Week: 7 days    Minutes of Exercise per Session: 30 min  Stress: No Stress Concern Present (06/11/2024)   [employer] of Occupational Health - Occupational Stress Questionnaire    Feeling of Stress: Not at all  Social Connections: Socially Integrated (06/11/2024)   Social Connection and Isolation Panel    Frequency of Communication with Friends and Family: More than three times a week    Frequency of Social Gatherings with Friends and Family: More than three times a week    Attends Religious Services: More than 4 times per year    Active Member of [employer] or Organizations: Yes    Attends Engineer, Structural: More than 4 times per year    Marital Status: Married  Catering Manager Violence: Not At Risk (06/11/2024)   Humiliation, Afraid, Rape, and Kick questionnaire    Fear of Current or Ex-Partner: No    Emotionally Abused: No    Physically Abused: No    Sexually Abused: No    FAMILY HISTORY:  Family History  Problem Relation Age of Onset   Cirrhosis Father    Heart attack Mother    Depression Mother    Stroke Mother     Hearing loss Brother    Cirrhosis Brother    Diabetes Sister    Sleep apnea Son    Hypertension Son     CURRENT MEDICATIONS:  Outpatient Encounter Medications as of 07/31/2024  Medication Sig   Alcohol  Swabs  PADS 1 each by Does not apply route as directed.   amLODipine  (NORVASC ) 10 MG tablet TAKE 1 TABLET EVERY DAY   Blood Glucose Monitoring Suppl (TRUE METRIX METER) w/Device KIT USE AS DIRECTED   Blood Glucose Monitoring Suppl DEVI 1 each by Does not apply route in the morning, at noon, and at bedtime. Patient insurance prefers One Touch. DX e11.65   cloNIDine  (CATAPRES ) 0.3 MG tablet TAKE 1 TABLET EVERY DAY   cyanocobalamin  (VITAMIN B12) 1000 MCG tablet Take 1 tablet (1,000 mcg total) by mouth daily.   ferrous gluconate  (FERGON) 324 MG tablet Take 1 tablet (324 mg total) by mouth daily with breakfast.   glucose blood (TRUE METRIX BLOOD GLUCOSE TEST) test strip USE AS INSTRUCTED   losartan -hydrochlorothiazide  (HYZAAR) 100-25 MG tablet TAKE 1 TABLET EVERY DAY   metFORMIN  (GLUCOPHAGE ) 1000 MG tablet TAKE 1 TABLET TWICE DAILY WITH MEALS   pantoprazole  (PROTONIX ) 40 MG tablet Take 1 tablet (40 mg total) by mouth daily.   potassium chloride  (KLOR-CON  M) 10 MEQ tablet TAKE 1 TABLET EVERY DAY   rosuvastatin  (CRESTOR ) 10 MG tablet TAKE 1 TABLET EVERY   TRUEplus Lancets 33G MISC 1 each by Does not apply route as directed.   [DISCONTINUED] Semaglutide  3 MG TABS Take 1 tablet (3 mg total) by mouth daily at 6 (six) AM.   [DISCONTINUED] Semaglutide  7 MG TABS Take 1 tablet (7 mg total) by mouth daily at 6 (six) AM.   No facility-administered encounter medications on file as of 07/31/2024.    ALLERGIES:  Allergies  Allergen Reactions   Jardiance  [Empagliflozin ] Other (See Comments)    Redness and irritation of genitals    LABORATORY DATA:  I have reviewed the labs as listed.  CBC    Component Value Date/Time   WBC 8.9 07/24/2024 0942   RBC 4.64 07/24/2024 0942   HGB 12.9 (L) 07/24/2024  0942   HGB 13.5 09/13/2023 0802   HCT 39.9 07/24/2024 0942  HCT 41.5 09/13/2023 0802   PLT 332 07/24/2024 0942   PLT 337 09/13/2023 0802   MCV 86.0 07/24/2024 0942   MCV 87 09/13/2023 0802   MCV 87 09/18/2011 0834   MCH 27.8 07/24/2024 0942   MCHC 32.3 07/24/2024 0942   RDW 14.1 07/24/2024 0942   RDW 12.9 09/13/2023 0802   RDW 13.5 09/18/2011 0834   LYMPHSABS 2.6 07/24/2024 0942   MONOABS 0.7 07/24/2024 0942   EOSABS 0.2 07/24/2024 0942   BASOSABS 0.1 07/24/2024 0942      Latest Ref Rng & Units 07/30/2024    8:06 AM 07/24/2024    9:42 AM 04/08/2024    7:46 AM  CMP  Glucose 70 - 99 mg/dL 850  871  868   BUN 8 - 27 mg/dL 21  18  21    Creatinine 0.76 - 1.27 mg/dL 8.94  9.04  8.93   Sodium 134 - 144 mmol/L 139  140  142   Potassium 3.5 - 5.2 mmol/L 4.0  3.9  4.4   Chloride 96 - 106 mmol/L 101  101  103   CO2 20 - 29 mmol/L 25  28  19    Calcium  8.6 - 10.2 mg/dL 8.9  9.1  9.1   Total Protein 6.0 - 8.5 g/dL 6.8  7.5    Total Bilirubin 0.0 - 1.2 mg/dL 0.9  0.9    Alkaline Phos 47 - 123 IU/L 61  59    AST 0 - 40 IU/L 12  13    ALT 0 - 44 IU/L 11  8      DIAGNOSTIC IMAGING:  I have independently reviewed the relevant imaging and discussed with the patient.   WRAP UP:  All questions were answered. The patient knows to call the clinic with any problems, questions or concerns.  Medical decision making: Low  Time spent on visit: I spent 20 minutes dedicated to the care of this patient (face-to-face and non-face-to-face) on the date of the encounter to include what is described in the assessment and plan.   Delon Hope, AGNP-C Department of Hematology/Oncology [facility] at [facility]  Phone: [phone]  07/31/2024 1:04 PM

## 2024-07-31 NOTE — Assessment & Plan Note (Addendum)
-  Found incidentally on recent lab draw. -Labs from 09/28/2023 show hemoglobin of 12.8.  No other low hemoglobin over the last 15 years. -He denies any bleeding.  He had a colonoscopy on 05/26/2024 which showed internal hemorrhoids and nodular mucosa in the entire examined stomach.  Pathology was consistent with gastritis, intestinal metaplasia present H. pylori negative. -Nutritional workup from 03/21/2024 shows iron saturation is 10%, ferritin 10, folate 11.9, copper  level 111, vitamin B12 148 and hemoglobin 12.8. - Repeat labs from 07/24/2024 show improvement of iron levels and B12 levels. -We discussed continuing iron and B12 supplements. -No need for IV iron at this time.  Return to clinic in 6 months with labs a few days before.

## 2024-08-01 LAB — MICROALBUMIN / CREATININE URINE RATIO
Creatinine, Urine: 46.8 mg/dL
Microalb/Creat Ratio: 34 mg/g{creat} — ABNORMAL HIGH (ref 0–29)
Microalbumin, Urine: 16 ug/mL

## 2024-08-04 MED ORDER — GLIPIZIDE 2.5 MG PO TABS: 2.5000 mg | ORAL_TABLET | Freq: Every day | ORAL | 3 refills | Status: DC

## 2024-08-05 ENCOUNTER — Other Ambulatory Visit: Payer: Self-pay

## 2024-08-05 MED ORDER — GLIPIZIDE 2.5 MG PO TABS: 2.5000 mg | ORAL_TABLET | Freq: Every day | ORAL | 3 refills | Status: DC

## 2024-08-21 ENCOUNTER — Ambulatory Visit (INDEPENDENT_AMBULATORY_CARE_PROVIDER_SITE_OTHER): Admitting: Gastroenterology

## 2024-08-21 ENCOUNTER — Encounter (INDEPENDENT_AMBULATORY_CARE_PROVIDER_SITE_OTHER): Payer: Self-pay | Admitting: Gastroenterology

## 2024-08-21 VITALS — BP 136/76 | HR 87 | Temp 97.5°F | Ht 72.0 in | Wt 275.0 lb

## 2024-08-21 DIAGNOSIS — D510 Vitamin B12 deficiency anemia due to intrinsic factor deficiency: Secondary | ICD-10-CM | POA: Diagnosis not present

## 2024-08-21 DIAGNOSIS — D509 Iron deficiency anemia, unspecified: Secondary | ICD-10-CM | POA: Diagnosis not present

## 2024-08-21 DIAGNOSIS — D500 Iron deficiency anemia secondary to blood loss (chronic): Secondary | ICD-10-CM

## 2024-08-21 DIAGNOSIS — K219 Gastro-esophageal reflux disease without esophagitis: Secondary | ICD-10-CM | POA: Insufficient documentation

## 2024-08-21 DIAGNOSIS — E538 Deficiency of other specified B group vitamins: Secondary | ICD-10-CM

## 2024-08-21 NOTE — Patient Instructions (Signed)
-  Please continue with daily iron and B12 as you are doing -Continue to follow with hematology -We will plan to repeat EGD again around march, we will reach out to you closer to time to schedule this -continue daily protonix  40mg    Follow up 6 months  It was a pleasure to see you today. I want to create trusting relationships with patients and provide genuine, compassionate, and quality care. I truly value your feedback! please be on the lookout for a survey regarding your visit with me today. I appreciate your input about our visit and your time in completing this!    Cynda Soule L. Lavonia Eager, MSN, APRN, AGNP-C Adult-Gerontology Nurse Practitioner [facility] at [facility]

## 2024-08-21 NOTE — Progress Notes (Signed)
 Referring Provider: Antonetta Rollene BRAVO, MD Primary Care Physician:  Antonetta Rollene BRAVO, MD Primary GI Physician: Dr. Cinderella  Chief Complaint  Patient presents with   Follow-up   HPI:   Dakota Mendez is a 63 y.o. male with past medical history of hypertension hyperlipidemia, OSA   Patient presenting today for:  Follow up of IDA and pernicious anemia  GERD   Last seen August by Dr. Cinderella, at that time iron saturation is 10%, ferritin 10, folate 11.9, copper  level 111, vitamin B12 148 and hemoglobin 12.8. endorsed some LUQ pain, postprandial. History of PUD in the past.   Recommended EGD, Colonoscopy, labs, protonix  40mg  daily, miralax   EGD:05/2024 - Normal esophagus.                           - Nodular mucosa in the entire stomach. Biopsied.                           - Polypoid lesions mucosa in the stomach.                            representative polypoid lesion was resected                           - Normal duodenal bulb and second portion of the                            duodenum. Biopsied.                           - Gastritis. Biopsied. Colonoscopy: 05/2024  - The examination was otherwise normal.                           - Non-bleeding internal hemorrhoids.                           - No specimens collected.  A. SMALL BOWEL BIOPSY:  - Small bowel mucosa with no specific pathologic change.  - Negative for intraepithelial lymphocytosis or villous blunting.   B. STOMACH POLYPECTOMY:  - Hyperplastic polyp.   C. ANTRUM BIOPSY:  - Chronic inactive gastritis.  - Intestinal metaplasia present.  - Immunohistochemical stain for H. pylori is negative.   D. STOMACH BODY BIOPSY:  - Chronic active gastritis.  - Intestinal metaplasia present.  - Immunohistochemical stain for H. pylori is negative.   Recommended repeat EGD 6 months-gastric polyps and intestinal metaplasia  Last labs in November with B12 469 (MMA 252 in July), iron 74, TIBC 391, sat 19 ferritin 35, CMP  grossly unremarkable other than Glucose 149  hgb 12.9   Following with hematology, last seen in November with plans to continue iron and b12 supplementation, repeat labs in 6 months   Present:  Doing well today. He is taking protonix  40mg  daily which seems to work well for him. He had an episode of indigestion a few days ago with something he ate but was able to belch and this improved. Appetite is good. No rectal blood, melena. No shortness or breath, dizziness. He is taking iron and B12 still. Energy levels are good.    Cologuard test Negative  2023 Last Colonoscopy:2018 - One 6 mm polyp in the transverse colon, removed with a hot snare. Resected and retrieved. - One small polyp in the sigmoid colon. Biopsied. - External and internal hemorrhoids. 2 HP . Repeat 10 years   Filed Weights   08/21/24 0936  Weight: 275 lb (124.7 kg)     Past Medical History:  Diagnosis Date   Chronic back pain    Chronic neck pain    Diabetes mellitus    Hyperlipidemia    Hypertension    Obesity    Sleep apnea    Not compliant with CPAP    Past Surgical History:  Procedure Laterality Date   CERVICAL SPINE SURGERY     COLONOSCOPY N/A 08/08/2017   Procedure: COLONOSCOPY;  Surgeon: Golda Claudis PENNER, MD;  [facility]: [facility];  Service: Endoscopy;  Laterality: N/A;  930   COLONOSCOPY N/A 05/26/2024   Procedure: COLONOSCOPY;  Surgeon: Cinderella Deatrice FALCON, MD;  Location: [facility];  Service: Endoscopy;  Laterality: N/A;  900AM, ASA 1-2   ESOPHAGOGASTRODUODENOSCOPY N/A 05/26/2024   Procedure: EGD (ESOPHAGOGASTRODUODENOSCOPY);  Surgeon: Cinderella Deatrice FALCON, MD;  Location: [facility];  Service: Endoscopy;  Laterality: N/A;   SPINE SURGERY  09/21/2011   cervial spine    Current Outpatient Medications  Medication Sig Dispense Refill   Alcohol  Swabs  PADS 1 each by Does not apply route as directed. 100 each 0   amLODipine  (NORVASC ) 10 MG tablet TAKE 1 TABLET EVERY DAY 90 tablet 3   Blood Glucose  Monitoring Suppl (TRUE METRIX METER) w/Device KIT USE AS DIRECTED 1 kit 0   Blood Glucose Monitoring Suppl DEVI 1 each by Does not apply route in the morning, at noon, and at bedtime. Patient insurance prefers One Touch. DX e11.65 1 each 0   cloNIDine  (CATAPRES ) 0.3 MG tablet TAKE 1 TABLET EVERY DAY 90 tablet 3   cyanocobalamin  (VITAMIN B12) 1000 MCG tablet Take 1 tablet (1,000 mcg total) by mouth daily. 90 tablet 2   ferrous gluconate  (FERGON) 324 MG tablet Take 1 tablet (324 mg total) by mouth daily with breakfast. 90 tablet 2   glucose blood (TRUE METRIX BLOOD GLUCOSE TEST) test strip USE AS INSTRUCTED 100 strip 11   losartan -hydrochlorothiazide  (HYZAAR) 100-25 MG tablet TAKE 1 TABLET EVERY DAY 90 tablet 3   metFORMIN  (GLUCOPHAGE ) 1000 MG tablet TAKE 1 TABLET TWICE DAILY WITH MEALS 180 tablet 3   pantoprazole  (PROTONIX ) 40 MG tablet Take 1 tablet (40 mg total) by mouth daily. 60 tablet 3   potassium chloride  (KLOR-CON  M) 10 MEQ tablet TAKE 1 TABLET EVERY DAY 90 tablet 3   rosuvastatin  (CRESTOR ) 10 MG tablet TAKE 1 TABLET EVERY 30 tablet 11   TRUEplus Lancets 33G MISC 1 each by Does not apply route as directed. 100 each 1   No current facility-administered medications for this visit.    Allergies as of 08/21/2024 - Review Complete 08/21/2024  Allergen Reaction Noted   Jardiance  [empagliflozin ] Other (See Comments) 11/30/2021    Social History   Socioeconomic History   Marital status: Married    Spouse name: Not on file   Number of children: 4   Years of education: Not on file   Highest education level: 9th grade  Occupational History   Occupation: disabled-truck dirver  Tobacco Use   Smoking status: Former    Current packs/day: 0.00    Average packs/day: 0.5 packs/day for 40.0 years (20.0 ttl pk-yrs)    Types: Cigarettes  Start date: 09/11/1982    Quit date: 09/11/2022    Years since quitting: 1.9   Smokeless tobacco: Never  Vaping Use   Vaping status: Never Used  Substance  and Sexual Activity   Alcohol  use: No   Drug use: No   Sexual activity: Yes  Other Topics Concern   Not on file  Social History Narrative   Not on file   Social Drivers of Health   Tobacco Use: Medium Risk (08/21/2024)   Patient History    Smoking Tobacco Use: Former    Smokeless Tobacco Use: Never    Passive Exposure: Not on Actuary Strain: Low Risk (06/11/2024)   Overall Financial Resource Strain (CARDIA)    Difficulty of Paying Living Expenses: Not hard at all  Food Insecurity: No Food Insecurity (06/11/2024)   Epic    Worried About Programme Researcher, Broadcasting/film/video in the Last Year: Never true    Ran Out of Food in the Last Year: Never true  Transportation Needs: No Transportation Needs (06/11/2024)   Epic    Lack of Transportation (Medical): No    Lack of Transportation (Non-Medical): No  Physical Activity: Sufficiently Active (06/11/2024)   Exercise Vital Sign    Days of Exercise per Week: 7 days    Minutes of Exercise per Session: 30 min  Stress: No Stress Concern Present (06/11/2024)   [employer] of Occupational Health - Occupational Stress Questionnaire    Feeling of Stress: Not at all  Social Connections: Socially Integrated (06/11/2024)   Social Connection and Isolation Panel    Frequency of Communication with Friends and Family: More than three times a week    Frequency of Social Gatherings with Friends and Family: More than three times a week    Attends Religious Services: More than 4 times per year    Active Member of Clubs or Organizations: Yes    Attends Banker Meetings: More than 4 times per year    Marital Status: Married  Depression (PHQ2-9): Low Risk (07/31/2024)   Depression (PHQ2-9)    PHQ-2 Score: 0  Alcohol  Screen: Low Risk (06/11/2024)   Alcohol  Screen    Last Alcohol  Screening Score (AUDIT): 0  Housing: Low Risk (06/11/2024)   Epic    Unable to Pay for Housing in the Last Year: No    Number of Times Moved in the Last Year: 0     Homeless in the Last Year: No  Utilities: Not At Risk (06/11/2024)   Epic    Threatened with loss of utilities: No  Health Literacy: Adequate Health Literacy (06/11/2024)   B1300 Health Literacy    Frequency of need for help with medical instructions: Never    Review of systems General: negative for malaise, night sweats, fever, chills, weight loss Neck: Negative for lumps, goiter, pain and significant neck swelling Resp: Negative for cough, wheezing, dyspnea at rest CV: Negative for chest pain, leg swelling, palpitations, orthopnea GI: denies melena, hematochezia, nausea, vomiting, diarrhea, constipation, dysphagia, odyonophagia, early satiety or unintentional weight loss.  MSK: Negative for joint pain or swelling, back pain, and muscle pain. Derm: Negative for itching or rash Psych: Denies depression, anxiety, memory loss, confusion. No homicidal or suicidal ideation.  Heme: Negative for prolonged bleeding, bruising easily, and swollen nodes. Endocrine: Negative for cold or heat intolerance, polyuria, polydipsia and goiter. Neuro: negative for tremor, gait imbalance, syncope and seizures. The remainder of the review of systems is noncontributory.  Physical Exam: BP 136/76  Pulse 87   Temp (!) 97.5 F (36.4 C) (Oral)   Ht 6' (1.829 m)   Wt 275 lb (124.7 kg)   SpO2 97%   BMI 37.30 kg/m  General:   Alert and oriented. No distress noted. Pleasant and cooperative.  Head:  Normocephalic and atraumatic. Eyes:  Conjuctiva clear without scleral icterus. Mouth:  Oral mucosa pink and moist. Good dentition. No lesions. Heart: Normal rate and rhythm, s1 and s2 heart sounds present.  Lungs: Clear lung sounds in all lobes. Respirations equal and unlabored. Abdomen:  +BS, soft, non-tender and non-distended. No rebound or guarding. No HSM or masses noted. Derm: No palmar erythema or jaundice Msk:  Symmetrical without gross deformities. Normal posture. Extremities:  Without  edema. Neurologic:  Alert and  oriented x4 Psych:  Alert and cooperative. Normal mood and affect.  Invalid input(s): 6 MONTHS   ASSESSMENT: Dakota Mendez is a 63 y.o. male presenting today for follow up of IDA, pernicious anemia and GERD  Recent EGD and Colonoscopy as outlined above with  Nodular mucosa in the entire stomach, gastric HPP, chronic active gastritis, intestinal metaplasia and hemorrhoids. He is following with hematology with improvement in iron and b12 levels. His energy levels are good. He has no rectal bleeding or melena. Seems to have had improvement with LUQ pain since being on protonix  40mg , rare breakthrough symptoms. For now will continue with iron and B12 supplementation, recommended to repeat EGD again in 6 months due to intestinal metaplasia and gastric polyps, we will reach out to him closer to due time to get this scheduled.    PLAN:  -continue iron and b12 supplementation -continue protonix  40mg  daily  -continue to follow with hematology -repeat EGD around march 2026   All questions were answered, patient verbalized understanding and is in agreement with plan as outlined above.   Follow Up: 6 months   Yuritzy Zehring L. Shakevia Sarris, MSN, APRN, AGNP-C Adult-Gerontology Nurse Practitioner [facility]

## 2024-08-29 ENCOUNTER — Other Ambulatory Visit: Payer: Self-pay | Admitting: Cardiology

## 2024-08-29 NOTE — Progress Notes (Signed)
 GENTRY PILSON                                          MRN: [id number]   08/29/2024   The [facility] Quality Team Specialist reviewed this patient medical record for the purposes of chart review for care gap closure. The following were reviewed: abstraction for care gap closure-kidney health evaluation for diabetes:eGFR  and uACR.    [facility] Quality Team

## 2024-09-08 ENCOUNTER — Telehealth: Payer: Self-pay | Admitting: Family Medicine

## 2024-09-08 ENCOUNTER — Encounter: Payer: Self-pay | Admitting: *Deleted

## 2024-09-08 NOTE — Telephone Encounter (Signed)
 Copied from CRM #[id number]. Topic: Medical Record Request - Records Request >> Sep 08, 2024 10:00 AM Ashley R wrote: Reason for CRM: Diabetic eye exam results from 05/23/23. Needed by [facility] [phone]. Mitch Raisin [phone]

## 2024-09-08 NOTE — Telephone Encounter (Signed)
"  faxed  "

## 2024-09-15 ENCOUNTER — Other Ambulatory Visit (INDEPENDENT_AMBULATORY_CARE_PROVIDER_SITE_OTHER): Payer: Self-pay | Admitting: Gastroenterology

## 2024-09-15 ENCOUNTER — Other Ambulatory Visit: Payer: Self-pay | Admitting: Family Medicine

## 2024-09-16 ENCOUNTER — Ambulatory Visit: Payer: Self-pay | Admitting: Family Medicine

## 2024-09-30 ENCOUNTER — Other Ambulatory Visit: Payer: Self-pay | Admitting: Family Medicine

## 2024-09-30 ENCOUNTER — Ambulatory Visit (HOSPITAL_COMMUNITY)

## 2024-10-02 ENCOUNTER — Other Ambulatory Visit: Payer: Self-pay

## 2024-10-02 ENCOUNTER — Telehealth: Payer: Self-pay

## 2024-10-02 DIAGNOSIS — E559 Vitamin D deficiency, unspecified: Secondary | ICD-10-CM

## 2024-10-02 DIAGNOSIS — E785 Hyperlipidemia, unspecified: Secondary | ICD-10-CM

## 2024-10-02 DIAGNOSIS — E1169 Type 2 diabetes mellitus with other specified complication: Secondary | ICD-10-CM

## 2024-10-02 DIAGNOSIS — I10 Essential (primary) hypertension: Secondary | ICD-10-CM

## 2024-10-02 MED ORDER — GLIPIZIDE ER 2.5 MG PO TB24: 2.5000 mg | ORAL_TABLET | Freq: Every day | ORAL | 2 refills | Status: AC

## 2024-10-02 NOTE — Telephone Encounter (Signed)
 Done

## 2024-10-02 NOTE — Addendum Note (Signed)
 Addended by: ANTONETTA ROLLENE BRAVO on: 10/02/2024 04:18 PM   Modules accepted: Orders

## 2024-10-02 NOTE — Telephone Encounter (Signed)
 Copied from CRM #[id number]. Topic: Clinical - Medication Refill >> Oct 02, 2024 12:54 PM Brittany M wrote: Medication: glipiZIDE  2.5 MG TABS   Has the patient contacted their pharmacy? Yes (Agent: If no, request that the patient contact the pharmacy for the refill. If patient does not wish to contact the pharmacy document the reason why and proceed with request.) (Agent: If yes, when and what did the pharmacy advise?)  This is the patient's preferred pharmacy:   [facility] - [place], [place] - [address] [place] [address] Phone: [phone] Fax: [phone]  Is this the correct pharmacy for this prescription? Yes If no, delete pharmacy and type the correct one.   Has the prescription been filled recently? Yes  Is the patient out of the medication? Yes  Has the patient been seen for an appointment in the last year OR does the patient have an upcoming appointment? Yes  Can we respond through MyChart? Yes  Agent: Please be advised that Rx refills may take up to 3 business days. We ask that you follow-up with your pharmacy.

## 2024-10-02 NOTE — Telephone Encounter (Signed)
 Pals order hBA1C, TSH, VIt D, bmp and EGFr to be drawn 2 to 3 days before his next appt and let him know glipizide  has been prescribed

## 2024-10-07 ENCOUNTER — Ambulatory Visit: Admitting: Nutrition

## 2024-10-14 ENCOUNTER — Encounter: Admitting: Nutrition

## 2024-10-14 ENCOUNTER — Encounter: Payer: Self-pay | Admitting: Nutrition

## 2024-10-14 VITALS — Ht 72.0 in | Wt 279.0 lb

## 2024-10-14 DIAGNOSIS — E1169 Type 2 diabetes mellitus with other specified complication: Secondary | ICD-10-CM

## 2024-10-23 ENCOUNTER — Ambulatory Visit: Admitting: Family Medicine

## 2024-11-06 ENCOUNTER — Ambulatory Visit: Admitting: Family Medicine

## 2025-01-22 ENCOUNTER — Inpatient Hospital Stay

## 2025-01-29 ENCOUNTER — Inpatient Hospital Stay: Admitting: Oncology

## 2025-03-09 ENCOUNTER — Encounter: Admitting: Nutrition

## 2025-06-16 ENCOUNTER — Ambulatory Visit
